# Patient Record
Sex: Female | Born: 1955 | Race: White | Hispanic: No | Marital: Married | Smoking: Never smoker
Health system: Southern US, Community
[De-identification: ages and names within clinical notes are randomized; demographics above are authoritative.]

## PROBLEM LIST (undated history)

## (undated) DIAGNOSIS — IMO0002 Reserved for concepts with insufficient information to code with codable children: Secondary | ICD-10-CM

## (undated) DIAGNOSIS — Z9889 Other specified postprocedural states: Secondary | ICD-10-CM

## (undated) DIAGNOSIS — M1611 Unilateral primary osteoarthritis, right hip: Secondary | ICD-10-CM

## (undated) DIAGNOSIS — Z8679 Personal history of other diseases of the circulatory system: Secondary | ICD-10-CM

## (undated) DIAGNOSIS — D134 Benign neoplasm of liver: Secondary | ICD-10-CM

## (undated) DIAGNOSIS — I495 Sick sinus syndrome: Secondary | ICD-10-CM

## (undated) DIAGNOSIS — E039 Hypothyroidism, unspecified: Secondary | ICD-10-CM

## (undated) DIAGNOSIS — R112 Nausea with vomiting, unspecified: Secondary | ICD-10-CM

## (undated) DIAGNOSIS — C801 Malignant (primary) neoplasm, unspecified: Secondary | ICD-10-CM

## (undated) DIAGNOSIS — M1990 Unspecified osteoarthritis, unspecified site: Secondary | ICD-10-CM

## (undated) DIAGNOSIS — K219 Gastro-esophageal reflux disease without esophagitis: Secondary | ICD-10-CM

## (undated) DIAGNOSIS — I341 Nonrheumatic mitral (valve) prolapse: Secondary | ICD-10-CM

## (undated) DIAGNOSIS — Z86010 Personal history of colonic polyps: Secondary | ICD-10-CM

## (undated) DIAGNOSIS — M5090 Cervical disc disorder, unspecified, unspecified cervical region: Secondary | ICD-10-CM

## (undated) DIAGNOSIS — N816 Rectocele: Secondary | ICD-10-CM

## (undated) DIAGNOSIS — R011 Cardiac murmur, unspecified: Secondary | ICD-10-CM

## (undated) HISTORY — DX: Rectocele: N81.6

## (undated) HISTORY — DX: Cardiac murmur, unspecified: R01.1

## (undated) HISTORY — DX: Reserved for concepts with insufficient information to code with codable children: IMO0002

## (undated) HISTORY — DX: Personal history of other diseases of the circulatory system: Z86.79

## (undated) HISTORY — DX: Sick sinus syndrome: I49.5

## (undated) HISTORY — DX: Personal history of colonic polyps: Z86.010

## (undated) HISTORY — DX: Unilateral primary osteoarthritis, right hip: M16.11

## (undated) HISTORY — DX: Unspecified osteoarthritis, unspecified site: M19.90

## (undated) HISTORY — DX: Nonrheumatic mitral (valve) prolapse: I34.1

## (undated) HISTORY — DX: Benign neoplasm of liver: D13.4

## (undated) HISTORY — DX: Cervical disc disorder, unspecified, unspecified cervical region: M50.90

## (undated) HISTORY — PX: JOINT REPLACEMENT: SHX530

## (undated) HISTORY — PX: SPINE SURGERY: SHX786

## (undated) HISTORY — PX: OTHER SURGICAL HISTORY: SHX169

## (undated) HISTORY — DX: Gastro-esophageal reflux disease without esophagitis: K21.9

---

## 1995-11-29 HISTORY — PX: LIVER BIOPSY: SHX301

## 1996-05-28 HISTORY — PX: ABDOMINAL HYSTERECTOMY: SHX81

## 2006-06-30 ENCOUNTER — Ambulatory Visit (HOSPITAL_COMMUNITY): Admission: RE | Admit: 2006-06-30 | Discharge: 2006-06-30 | Payer: Self-pay | Admitting: Family Medicine

## 2006-07-10 ENCOUNTER — Encounter: Admission: RE | Admit: 2006-07-10 | Discharge: 2006-07-10 | Payer: Self-pay | Admitting: Family Medicine

## 2006-11-28 HISTORY — PX: KNEE RECONSTRUCTION: SHX5883

## 2008-02-07 ENCOUNTER — Other Ambulatory Visit: Admission: RE | Admit: 2008-02-07 | Discharge: 2008-02-07 | Payer: Self-pay | Admitting: Family Medicine

## 2008-03-10 ENCOUNTER — Ambulatory Visit (HOSPITAL_COMMUNITY): Admission: RE | Admit: 2008-03-10 | Discharge: 2008-03-10 | Payer: Self-pay | Admitting: Family Medicine

## 2009-11-24 ENCOUNTER — Ambulatory Visit (HOSPITAL_COMMUNITY): Admission: RE | Admit: 2009-11-24 | Discharge: 2009-11-24 | Payer: Self-pay | Admitting: Family Medicine

## 2010-11-28 HISTORY — PX: BACK SURGERY: SHX140

## 2010-12-20 ENCOUNTER — Encounter: Payer: Self-pay | Admitting: Family Medicine

## 2011-06-14 LAB — CBC WITH DIFFERENTIAL/PLATELET
HGB: 14.4 g/dL
Hematocrit: 42.1 % (ref 34.8–46)
MCHC: 34.3
MCV (KUC): 89.2 fL (ref 78–100)
Platelets: 318
RBC: 4.72
RDW: 12.7
WBC: 9.9

## 2011-06-22 ENCOUNTER — Ambulatory Visit (HOSPITAL_COMMUNITY): Payer: BC Managed Care – PPO

## 2011-06-22 ENCOUNTER — Ambulatory Visit (HOSPITAL_COMMUNITY)
Admission: RE | Admit: 2011-06-22 | Discharge: 2011-06-23 | Disposition: A | Discharge status: Home / Self Care | Payer: BC Managed Care – PPO | Source: Ambulatory Visit | Attending: Orthopedic Surgery | Admitting: Orthopedic Surgery

## 2011-06-22 DIAGNOSIS — E669 Obesity, unspecified: Secondary | ICD-10-CM | POA: Insufficient documentation

## 2011-06-22 DIAGNOSIS — M5126 Other intervertebral disc displacement, lumbar region: Secondary | ICD-10-CM | POA: Insufficient documentation

## 2011-06-22 LAB — CBC
HCT: 37.9 % (ref 36.0–46.0)
MCHC: 33.8 g/dL (ref 30.0–36.0)
RDW: 13.2 % (ref 11.5–15.5)

## 2011-06-22 LAB — SURGICAL PCR SCREEN: Staphylococcus aureus: NEGATIVE

## 2011-06-30 NOTE — Op Note (Signed)
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  OPERATIVE PROCEDURE:  Lumbar laminectomy for diskectomy L4-5.  COMPLICATIONS:  None.  CONDITION:  Stable.  FIRST ASSISTANT:  Norval Gable, PA  REASON FOR SURGERY:  Severe right L5 radicular pain with foot drop and weakness with MRI findings consistent with acute L4-5 disk herniation posterolateral to the right.  OPERATIVE NOTE:  The patient is a pleasant 55 year old woman with severe back and right leg pain.  She was evaluated in the holding area.  She identified the correct extremity, it was marked.  She was brought back to the operating room, placed supine on the operating table.  After successful induction of general anesthesia and endotracheal intubation, TEDs, SCDs were applied.  She was turned prone onto the Bartlett frame. All bony prominences were well padded.  Back was prepped and draped in standard fashion.  Appropriate time-out was done to confirm patient, procedure, affected extremity and all other pertinent important data. Two 18-gauge needles were placed at the back for localization of incision.  X-rays were done which demonstrated satisfactory position.  A 1-inch incision was made on the back in the midline.  Sharp dissection was carried out down to the deep fascia.  Deep fascia was sharply incised and I stripped the paraspinal muscle to expose the L4 lamina.  I placed a Cytogeneticist and a Penfield 4 underneath the L4 lamina.  I then took a second x-ray confirming that I was at the appropriate level. Once I confirmed I was at  the appropriate level, a laminotomy was performed with 3-mm Kerrison at L4.  I then released the ligamentum flavum from the insertion of the L5 leading into the lamina and then removed the ligamentum flavum in its entirety to expose the thecal sac. I dissected with a Penfield 4 to lateral gutter and I noted a very large disk herniation.  The disk had caused significant compression of the nerve root against the L5 lamina.  I mobilized the disk, applied gentle downward pressure on the annulus which allowed the disk material to rub through.  Using a micro pituitary rongeur, I removed multiple large fragments of disk material consistent with what was seen on the MRI.  I then swept circumferentially with a nerve hook removing other free fragments of disk material.  At this point, I then identified the disk itself, used a micro pituitary rongeur to insert into the disk space and removed any other loose fragments of disk material that has not yet herniated out.  At this point, I irrigated the wound copiously with normal saline, used my bipolar electrocautery to obtain hemostasis.  A Woodson elevator was used to pass superiorly and inferiorly in the lateral gutter and out the L5 foramen.  The L5 nerve root was completely decompressed, it was  free of any tension.  I could pass the [facility] elevator circumferential at the disk space.  There was no further fragments of disk material centrally, inferiorly, superiorly or in the lateral recess or in the foramen.  At this point, I then placed thrombin- soaked Gelfoam patty over the exposed lamina and then closed the deep fascia with interrupted #1 Vicryl suture.  I then closed the deep fascia with interrupted 2-0 Vicryl suture and 3-0 Monocryl for the skin.  Steri-Strips and dry dressings were applied.  The patient was extubated, transferred to PACU without incident.  At the end of the case, all needle and sponge counts were correct.     Alvy Beal, MD     DDB/MEDQ  D:  06/22/2011  T:  06/23/2011  Job:  [id number]  Electronically Signed by Venita Lick MD on 06/30/2011 11:29:52 AM

## 2012-12-17 LAB — LIPID PANEL
CHOLESTEROL: 217 mg/dL — AB (ref 0–200)
Cholesterol: 2.9
LDL DIRECT: 113
Non-HDL Cholesterol: 141
TOTAL HDL-C DIRECT: 76
TRIGLYCERIDES: 103

## 2013-01-18 LAB — COMPLETE METABOLIC PANEL WITH GFR
ALP, HEAT STABLE (LIVER): 73
ALT: 15 U/L (ref 7–35)
AST: 15 U/L
Albumin: 4.7
Anion gap: 11
BILIRUBIN TOTAL: 0.5 mg/dL
BUN: 12 mg/dL (ref 4–21)
CHLORIDE: 103 mmol/L
CO2: 29 mmol/L
CREATININE: 0.71
Calcium: 9.8 mg/dL
GFR, EST NON AFRICAN AMERICAN: 85
GFR, Est African American: 103
Glucose: 76
POTASSIUM: 4 mmol/L
SODIUM: 139 mmol/L (ref 137–147)
Total Protein: 7.1 g/dL

## 2014-03-17 ENCOUNTER — Ambulatory Visit (INDEPENDENT_AMBULATORY_CARE_PROVIDER_SITE_OTHER): Payer: BC Managed Care – PPO | Admitting: Nurse Practitioner

## 2014-03-17 ENCOUNTER — Encounter: Payer: Self-pay | Admitting: Nurse Practitioner

## 2014-03-17 VITALS — BP 122/80 | HR 56 | Temp 98.2°F | Resp 18 | Ht 68.0 in | Wt 221.0 lb

## 2014-03-17 DIAGNOSIS — R232 Flushing: Secondary | ICD-10-CM

## 2014-03-17 DIAGNOSIS — Z8679 Personal history of other diseases of the circulatory system: Secondary | ICD-10-CM

## 2014-03-17 DIAGNOSIS — L989 Disorder of the skin and subcutaneous tissue, unspecified: Secondary | ICD-10-CM | POA: Insufficient documentation

## 2014-03-17 MED ORDER — ESCITALOPRAM OXALATE 10 MG PO TABS: 10.0000 mg | ORAL_TABLET | Freq: Every day | ORAL | Status: DC

## 2014-03-17 NOTE — Progress Notes (Signed)
Pre visit review using our clinic review tool, if applicable. No additional management support is needed unless otherwise documented below in the visit note. 

## 2014-03-17 NOTE — Patient Instructions (Signed)
Start lexapro for hot flushes. I can go up on dose if hot flushes are not controlled. Use for at least 2 weeks before deciding if this dose works. Please follow up in 4 weeks.   Menopause Menopause is the normal time of life when menstrual periods stop completely. Menopause is complete when you have missed 12 consecutive menstrual periods. It usually occurs between the ages of 54 years and 76 years. Very rarely does a woman develop menopause before the age of 70 years. At menopause, your ovaries stop producing the female hormones estrogen and progesterone. This can cause undesirable symptoms and also affect your health. Sometimes the symptoms may occur 4 5 years before the menopause begins. There is no relationship between menopause and:  Oral contraceptives.  Number of children you had.  Race.  The age your menstrual periods started (menarche). Heavy smokers and very thin women may develop menopause earlier in life. CAUSES  The ovaries stop producing the female hormones estrogen and progesterone.  Other causes include:  Surgery to remove both ovaries.  The ovaries stop functioning for no known reason.  Tumors of the pituitary gland in the brain.  Medical disease that affects the ovaries and hormone production.  Radiation treatment to the abdomen or pelvis.  Chemotherapy that affects the ovaries. SYMPTOMS   Hot flashes.  Night sweats.  Decrease in sex drive.  Vaginal dryness and thinning of the vagina causing painful intercourse.  Dryness of the skin and developing wrinkles.  Headaches.  Tiredness.  Irritability.  Memory problems.  Weight gain.  Bladder infections.  Hair growth of the face and chest.  Infertility. More serious symptoms include:  Loss of bone (osteoporosis) causing breaks (fractures).  Depression.  Hardening and narrowing of the arteries (atherosclerosis) causing heart attacks and strokes. DIAGNOSIS   When the menstrual periods have  stopped for 12 straight months.  Physical exam.  Hormone studies of the blood. TREATMENT  There are many treatment choices and nearly as many questions about them. The decisions to treat or not to treat menopausal changes is an individual choice made with your health care provider. Your health care provider can discuss the treatments with you. Together, you can decide which treatment will work best for you. Your treatment choices may include:   Hormone therapy (estrogen and progesterone).  Non-hormonal medicines.  Treating the individual symptoms with medicine (for example antidepressants for depression).  Herbal medicines that may help specific symptoms.  Counseling by a psychiatrist or psychologist.  Group therapy.  Lifestyle changes including:  Eating healthy.  Regular exercise.  Limiting caffeine and alcohol.  Stress management and meditation.  No treatment. HOME CARE INSTRUCTIONS   Take the medicine your health care provider gives you as directed.  Get plenty of sleep and rest.  Exercise regularly.  Eat a diet that contains calcium (good for the bones) and soy products (acts like estrogen hormone).  Avoid alcoholic beverages.  Do not smoke.  If you have hot flashes, dress in layers.  Take supplements, calcium, and vitamin D to strengthen bones.  You can use over-the-counter lubricants or moisturizers for vaginal dryness.  Group therapy is sometimes very helpful.  Acupuncture may be helpful in some cases. SEEK MEDICAL CARE IF:   You are not sure you are in menopause.  You are having menopausal symptoms and need advice and treatment.  You are still having menstrual periods after age 53 years.  You have pain with intercourse.  Menopause is complete (no menstrual period for 12  months) and you develop vaginal bleeding.  You need a referral to a specialist (gynecologist, psychiatrist, or psychologist) for treatment. SEEK IMMEDIATE MEDICAL CARE IF:    You have severe depression.  You have excessive vaginal bleeding.  You fell and think you have a broken bone.  You have pain when you urinate.  You develop leg or chest pain.  You have a fast pounding heart beat (palpitations).  You have severe headaches.  You develop vision problems.  You feel a lump in your breast.  You have abdominal pain or severe indigestion. Document Released: 02/04/2004 Document Revised: 07/17/2013 Document Reviewed: 06/13/2013 [facility] Patient Information 2014 [place], [place].

## 2014-03-17 NOTE — Progress Notes (Signed)
Subjective:    Cynthia [name] is a 58 y.o. female who wishes to establish care. She wants to discuss hormone replacement therapy. SHe has been taking .5 mg estradiol for 15 years. SHe had hysterectomy in 1997, started having hot flashes a few years later & started on HRT to manage vasomotor symptoms. She is concerned about harmful effects of HRT and wishes to discontinue HRT. She has been weaning HRT over the last 3 weeks, taking qod to q3d. She has had a few hot flushes and decreased energy, but also c/o respiratory illness in last few weeks, symptoms resolving.  Also she is concerned about a lesion on nose. She states lesion has been there for several years, but it has gotten larger & bleeds occasionally Of note, she gives Hx of cardiac arrythmia-states SA nose conduction problem. Occasionally has several hours of "palpitations" NOT accompanied by nausea, diaphoresis, SOB, chest or neck pain or discomfort. Last evaluated by cardio about 8 yrs ago. ECG at most recent PCP a few years ago. She is not interested in pursuing work up.  No LMP recorded. Patient has had a hysterectomy.   The following portions of the patient's history were reviewed and updated as appropriate: allergies, current medications, past family history, past medical history, past social history, past surgical history and problem list.  Review of Systems Constitutional: positive for fatigue, negative for fevers and night sweats Eyes: positive for contacts/glasses Ears, nose, mouth, throat, and face: negative for nasal congestion and sore throat Respiratory: positive for cough and resolving Cardiovascular: positive for palpitations, negative for chest pain, chest pressure/discomfort, irregular heart beat, lower extremity edema and syncope Gastrointestinal: positive for constipation, diarrhea and rectocele-has to manually push stool out sometimes, negative for abdominal pain, dyspepsia, melena and  nausea Genitourinary:negative Integument/breast: negative Musculoskeletal:positive for arthralgias, back pain and am stiffness in hips Behavioral/Psych: negative for excessive alcohol consumption, illegal drug usage, tobacco use and positive for sleep disturbance    Objective:     BP 122/80  Pulse 56  Temp(Src) 98.2 F (36.8 C) (Oral)  Resp 18  Ht 5\' 8"  (1.727 m)  Wt 221 lb (100.245 kg)  BMI 33.61 kg/m2  SpO2 99% BP 122/80  Pulse 56  Temp(Src) 98.2 F (36.8 C) (Oral)  Resp 18  Ht 5\' 8"  (1.727 m)  Wt 221 lb (100.245 kg)  BMI 33.61 kg/m2  SpO2 99% General appearance: alert, cooperative, appears stated age and no distress Head: Normocephalic, without obvious abnormality, atraumatic Eyes: negative findings: lids and lashes normal, conjunctivae and sclerae normal and wearing glasses Lungs: clear to auscultation bilaterally Heart: regular rate and rhythm, S1, S2 normal, no murmur, click, rub or gallop   Skin: pink lesionR side of nose. Approximately 30mm, raised, scooped center Assessment & Plan:    1. Vasomotor flushing Stop estradiol - escitalopram (LEXAPRO) 10 MG tablet; Take 1 tablet (10 mg total) by mouth daily.  Dispense: 30 tablet; Refill: 1 F/u 3-4 weeks. 2. Skin lesion of face Suspicious of basal cell Ca - Ambulatory referral to Dermatology

## 2014-04-14 ENCOUNTER — Ambulatory Visit: Payer: BC Managed Care – PPO | Admitting: Nurse Practitioner

## 2014-04-17 ENCOUNTER — Other Ambulatory Visit: Payer: Self-pay | Admitting: Nurse Practitioner

## 2014-04-17 DIAGNOSIS — R232 Flushing: Secondary | ICD-10-CM

## 2014-04-17 MED ORDER — ESCITALOPRAM OXALATE 10 MG PO TABS: 10.0000 mg | ORAL_TABLET | Freq: Every day | ORAL | Status: DC

## 2014-04-17 NOTE — Progress Notes (Signed)
Left detailed message on pt's vm requesting that she return call to reschedule ov appointment for mid-august.

## 2014-04-25 ENCOUNTER — Ambulatory Visit (INDEPENDENT_AMBULATORY_CARE_PROVIDER_SITE_OTHER): Payer: BC Managed Care – PPO | Admitting: Nurse Practitioner

## 2014-04-25 ENCOUNTER — Encounter: Payer: Self-pay | Admitting: Nurse Practitioner

## 2014-04-25 VITALS — BP 121/81 | HR 72 | Temp 98.1°F | Ht 68.0 in | Wt 220.0 lb

## 2014-04-25 DIAGNOSIS — L989 Disorder of the skin and subcutaneous tissue, unspecified: Secondary | ICD-10-CM

## 2014-04-25 DIAGNOSIS — B9789 Other viral agents as the cause of diseases classified elsewhere: Secondary | ICD-10-CM

## 2014-04-25 DIAGNOSIS — J988 Other specified respiratory disorders: Principal | ICD-10-CM

## 2014-04-25 DIAGNOSIS — R232 Flushing: Secondary | ICD-10-CM

## 2014-04-25 NOTE — Progress Notes (Signed)
Pre visit review using our clinic review tool, if applicable. No additional management support is needed unless otherwise documented below in the visit note. 

## 2014-04-25 NOTE — Patient Instructions (Signed)
You should be better in a week.   Voice rest this weekend.   You may have injured the cricoid cartilege with vigorous coughing. It will heal, but will take several weeks.  Continue benzocaine throat lozenges.  I will call when I receive your historical records and create a plan for preventive care.

## 2014-04-25 NOTE — Progress Notes (Signed)
Patient scheduled appt for 04/25/2014 at 4:00 pm.

## 2014-04-28 ENCOUNTER — Other Ambulatory Visit: Payer: Self-pay | Admitting: Nurse Practitioner

## 2014-04-28 DIAGNOSIS — B9789 Other viral agents as the cause of diseases classified elsewhere: Secondary | ICD-10-CM | POA: Insufficient documentation

## 2014-04-28 DIAGNOSIS — J988 Other specified respiratory disorders: Principal | ICD-10-CM

## 2014-04-28 NOTE — Assessment & Plan Note (Signed)
Spoke w/ pt on 5/23-saw at school event. She c/o sore throat, fatigue, nasal congestion, cough. Denied fever, HA, abd pain, N&V&D. Had symtpoms for 1 week. I prescribed tussionex, benzonatate capsules, adv to rinse sinuses, use throat lozenge. ShE is better, coughing less, sleeping, more energy, although not 100% to baseline. Nml exam.

## 2014-04-28 NOTE — Progress Notes (Signed)
Subjective:     Cynthia [name] is a 58 y.o. female who presents for follow up of vasomotor symptoms, & cough. She was started on low dose lexapro to control menopausal vasomotor symptoms-hot flushes. She had been on HRT for 10 years +. She started lexapro, weaned off estrogen. Feels well, no vasomotor symptoms, sleeping better. Regarding cough, she has improved w/tussionex & benzonatate. She denies fever, chest pain, but has pain in throat-as if strained throat from coughing.  She went to derm, had nose lesion removed, Pt states derm feels lesion is BCC. She has f/u appt. Pending. We discussed need for CPE. Still waiting in historical records.  The following portions of the patient's history were reviewed and updated as appropriate: allergies, current medications, past family history, past medical history, past social history, past surgical history and problem list.  Review of Systems Pertinent items are noted in HPI.    Objective:    BP 121/81  Pulse 72  Temp(Src) 98.1 F (36.7 C) (Temporal)  Ht 5\' 8"  (1.727 m)  Wt 220 lb (99.791 kg)  BMI 33.46 kg/m2  SpO2 99% BP 121/81  Pulse 72  Temp(Src) 98.1 F (36.7 C) (Temporal)  Ht 5\' 8"  (1.727 m)  Wt 220 lb (99.791 kg)  BMI 33.46 kg/m2  SpO2 99% General appearance: alert, cooperative, appears stated age and no distress Head: Normocephalic, without obvious abnormality, atraumatic Eyes: negative findings: lids and lashes normal and conjunctivae and sclerae normal Ears: normal TM's and external ear canals both ears Nose: scant white discharge bilat nares Throat: lips, mucosa, and tongue normal; teeth and gums normal Lungs: clear to auscultation bilaterally Heart: regular rate and rhythm, S1, S2 normal, no murmur, click, rub or gallop    Assessment & plan:     1. Viral respiratory illness   2. Skin lesion of face   3. Vasomotor flushing    See problem list for complete A&P See pt instructions. Pt to schedule CPE at  convenience-will call when receive historical records.

## 2014-04-28 NOTE — Assessment & Plan Note (Signed)
Lesion removed. Derm thinks BCC. Path results not returned yet. Will continue to f/u w/derm.

## 2014-04-28 NOTE — Assessment & Plan Note (Signed)
Complete resolve in symptoms. Stopped estrogen replacement. Continue lexapro 10 mg qhs. Sleeping better.

## 2014-05-21 ENCOUNTER — Ambulatory Visit (INDEPENDENT_AMBULATORY_CARE_PROVIDER_SITE_OTHER): Payer: BC Managed Care – PPO | Admitting: Family Medicine

## 2014-05-21 ENCOUNTER — Encounter: Payer: Self-pay | Admitting: Family Medicine

## 2014-05-21 VITALS — BP 109/73 | HR 68 | Temp 98.7°F | Resp 18 | Ht 68.0 in | Wt 220.0 lb

## 2014-05-21 DIAGNOSIS — R058 Other specified cough: Secondary | ICD-10-CM

## 2014-05-21 DIAGNOSIS — R05 Cough: Secondary | ICD-10-CM

## 2014-05-21 DIAGNOSIS — R059 Cough, unspecified: Secondary | ICD-10-CM

## 2014-05-21 NOTE — Patient Instructions (Signed)
Buy generic otc nonsedating antihistamine (zyrtec or allegra) and take every day at full adult dosing for 2 weeks. Buy OTC generic prilosec and take once daily for 2 weeks (best taken in morning).

## 2014-05-21 NOTE — Progress Notes (Signed)
OFFICE NOTE  05/21/2014  CC:  Chief Complaint  Patient presents with  . Ear Drainage  . Sinus Problem  . Cough    mainly at night   HPI: Patient is a 58 y.o. Caucasian female who is here for 3 mo hx of : started with severe sore throat, low grade fever, then this morphed into common cold sx's.  She waited a while and then presented here and was treated symptomatically for viral URI w/paroxysmal coughing. Sx's are better, but she still has feeling of ears being plugged up, has some PND at night, has a coughing paroxysm about once per night.  No otc meds.  Takes tessalon perles at night. Cough is minimal at night.  No drainage out her nose.  No wheezing or SOB or CP.  Pertinent PMH:  Past Medical History  Diagnosis Date  . Arthritis   . 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 surveillance  . Rectocele   . SA node dysfunction   . History of rheumatic fever     childhood  . Mitral valve prolapse    Past Surgical History  Procedure Laterality Date  . Knee reconstruction Right 2008  . Back surgery  2012    disc rupture  . Liver biopsy  1997    fatty tumors  . Abdominal hysterectomy      MEDS:  Outpatient Prescriptions Prior to Visit  Medication Sig Dispense Refill  . benzonatate (TESSALON) 100 MG capsule       . escitalopram (LEXAPRO) 10 MG tablet Take 1 tablet (10 mg total) by mouth daily.  90 tablet  0  . chlorpheniramine-HYDROcodone (TUSSIONEX) 10-8 MG/5ML LQCR        No facility-administered medications prior to visit.    PE: Blood pressure 109/73, pulse 68, temperature 98.7 F (37.1 C), temperature source Temporal, resp. rate 18, height 5\' 8"  (1.727 m), weight 220 lb (99.791 kg), SpO2 97.00%. Gen: Alert, well appearing.  Patient is oriented to person, place, time, and situation. ENT: Ears: EACs clear, normal epithelium.  TMs with good light reflex and landmarks bilaterally.  Eyes: no injection, icteris, swelling, or exudate.  EOMI,  PERRLA. Nose: no drainage or turbinate edema/swelling.  No injection or focal lesion.  Mouth: lips without lesion/swelling.  Oral mucosa pink and moist.  Dentition intact and without obvious caries or gingival swelling.  Oropharynx without erythema, exudate, or swelling.  Neck - No masses or thyromegaly or limitation in range of motion CV: RRR, no m/r/g.   LUNGS: CTA bilat, nonlabored resps, good aeration in all lung fields.   IMPRESSION AND PLAN:  Upper airway cough syndrome: discussed dx.  REassured pt. May continue prn tessalon perles. Add nightly OTC nonsedating antihistamine and prilosec 20mg  OTC qAM.  FOLLOW UP: in 2-3 wks if not significantly improved.    2

## 2014-05-21 NOTE — Progress Notes (Signed)
Pre visit review using our clinic review tool, if applicable. No additional management support is needed unless otherwise documented below in the visit note. 

## 2014-05-22 ENCOUNTER — Ambulatory Visit: Payer: BC Managed Care – PPO | Admitting: Family Medicine

## 2014-05-27 ENCOUNTER — Encounter: Payer: Self-pay | Admitting: Nurse Practitioner

## 2014-05-27 DIAGNOSIS — Z86010 Personal history of colonic polyps: Secondary | ICD-10-CM | POA: Insufficient documentation

## 2014-05-27 DIAGNOSIS — Z0000 Encounter for general adult medical examination without abnormal findings: Secondary | ICD-10-CM | POA: Insufficient documentation

## 2014-06-06 ENCOUNTER — Encounter: Payer: Self-pay | Admitting: *Deleted

## 2014-07-16 ENCOUNTER — Other Ambulatory Visit: Payer: Self-pay | Admitting: *Deleted

## 2014-07-16 DIAGNOSIS — R232 Flushing: Secondary | ICD-10-CM

## 2014-07-16 NOTE — Telephone Encounter (Signed)
Refill request for escitalopram Last filled by MD on- 04/17/14 #90 x0 Last Appt: 05/21/2014 Next Appt: none Please advise refill?

## 2014-07-17 MED ORDER — ESCITALOPRAM OXALATE 10 MG PO TABS: 10.0000 mg | ORAL_TABLET | Freq: Every day | ORAL | Status: DC

## 2015-01-12 ENCOUNTER — Other Ambulatory Visit: Payer: Self-pay | Admitting: Nurse Practitioner

## 2015-01-12 ENCOUNTER — Telehealth: Payer: Self-pay | Admitting: Nurse Practitioner

## 2015-01-12 DIAGNOSIS — R232 Flushing: Secondary | ICD-10-CM

## 2015-01-12 MED ORDER — ESCITALOPRAM OXALATE 10 MG PO TABS: 10.0000 mg | ORAL_TABLET | Freq: Every day | ORAL | Status: DC

## 2015-01-12 NOTE — Telephone Encounter (Signed)
Escitalopram 10mg  Tablet last Filled: 11.15.15

## 2015-01-13 NOTE — Progress Notes (Signed)
Left detailed message on pt's vm. Per dpr.

## 2015-01-19 NOTE — Progress Notes (Addendum)
Left 3rd message for pt to return call and schedule appt.

## 2015-01-19 NOTE — Progress Notes (Signed)
Left 2nd message for pt to return call to schedule appt.

## 2015-02-04 NOTE — Progress Notes (Signed)
Unable to contact letter sent to patient; 

## 2015-04-06 ENCOUNTER — Other Ambulatory Visit: Payer: Self-pay | Admitting: Nurse Practitioner

## 2015-04-06 DIAGNOSIS — R232 Flushing: Secondary | ICD-10-CM

## 2015-04-06 MED ORDER — ESCITALOPRAM OXALATE 10 MG PO TABS: 10.0000 mg | ORAL_TABLET | Freq: Every day | ORAL | Status: DC

## 2015-04-10 ENCOUNTER — Telehealth: Payer: Self-pay

## 2015-04-10 ENCOUNTER — Ambulatory Visit (INDEPENDENT_AMBULATORY_CARE_PROVIDER_SITE_OTHER): Payer: BC Managed Care – PPO | Admitting: Nurse Practitioner

## 2015-04-10 ENCOUNTER — Encounter: Payer: Self-pay | Admitting: Nurse Practitioner

## 2015-04-10 ENCOUNTER — Other Ambulatory Visit: Payer: Self-pay | Admitting: Nurse Practitioner

## 2015-04-10 VITALS — BP 110/74 | HR 65 | Temp 98.2°F | Ht 68.0 in | Wt 228.0 lb

## 2015-04-10 DIAGNOSIS — M25569 Pain in unspecified knee: Secondary | ICD-10-CM | POA: Insufficient documentation

## 2015-04-10 DIAGNOSIS — J069 Acute upper respiratory infection, unspecified: Secondary | ICD-10-CM | POA: Diagnosis not present

## 2015-04-10 DIAGNOSIS — Z0000 Encounter for general adult medical examination without abnormal findings: Secondary | ICD-10-CM | POA: Diagnosis not present

## 2015-04-10 DIAGNOSIS — J029 Acute pharyngitis, unspecified: Secondary | ICD-10-CM | POA: Diagnosis not present

## 2015-04-10 DIAGNOSIS — Z114 Encounter for screening for human immunodeficiency virus [HIV]: Secondary | ICD-10-CM

## 2015-04-10 DIAGNOSIS — M25561 Pain in right knee: Secondary | ICD-10-CM | POA: Diagnosis not present

## 2015-04-10 LAB — COMPREHENSIVE METABOLIC PANEL
ALK PHOS: 114 U/L (ref 39–117)
ALT: 18 U/L (ref 0–35)
AST: 17 U/L (ref 0–37)
Albumin: 4.4 g/dL (ref 3.5–5.2)
BILIRUBIN TOTAL: 0.5 mg/dL (ref 0.2–1.2)
BUN: 15 mg/dL (ref 6–23)
CHLORIDE: 102 meq/L (ref 96–112)
CO2: 25 meq/L (ref 19–32)
Calcium: 9.9 mg/dL (ref 8.4–10.5)
Creatinine, Ser: 0.68 mg/dL (ref 0.40–1.20)
GFR: 94.3 mL/min (ref >60.00)
Glucose, Bld: 113 mg/dL — ABNORMAL HIGH (ref 70–99)
Potassium: 4.2 mEq/L (ref 3.5–5.1)
Sodium: 137 mEq/L (ref 135–145)
Total Protein: 7 g/dL (ref 6.0–8.3)

## 2015-04-10 LAB — VITAMIN D 25 HYDROXY (VIT D DEFICIENCY, FRACTURES): VITD: 12.96 ng/mL — AB (ref 30.00–100.00)

## 2015-04-10 LAB — TSH: TSH: 2.64 u[IU]/mL (ref 0.35–4.50)

## 2015-04-10 LAB — CBC WITH DIFFERENTIAL/PLATELET
Basophils Absolute: 0 10*3/uL (ref 0.0–0.1)
Basophils Relative: 0.4 % (ref 0.0–3.0)
Eosinophils Absolute: 0.2 10*3/uL (ref 0.0–0.7)
Eosinophils Relative: 2.8 % (ref 0.0–5.0)
HEMATOCRIT: 39.7 % (ref 36.0–46.0)
HEMOGLOBIN: 13.5 g/dL (ref 12.0–15.0)
LYMPHS ABS: 1.3 10*3/uL (ref 0.7–4.0)
Lymphocytes Relative: 17.9 % (ref 12.0–46.0)
MCHC: 33.9 g/dL (ref 30.0–36.0)
MCV: 89.2 fl (ref 78.0–100.0)
MONO ABS: 0.4 10*3/uL (ref 0.1–1.0)
Monocytes Relative: 6 % (ref 3.0–12.0)
Neutro Abs: 5.4 10*3/uL (ref 1.4–7.7)
Neutrophils Relative %: 72.9 % (ref 43.0–77.0)
Platelets: 265 10*3/uL (ref 150.0–400.0)
RBC: 4.45 Mil/uL (ref 3.87–5.11)
RDW: 14.3 % (ref 11.5–15.5)
WBC: 7.4 10*3/uL (ref 4.0–10.5)

## 2015-04-10 LAB — LIPID PANEL
CHOL/HDL RATIO: 3
CHOLESTEROL: 205 mg/dL — AB (ref 0–200)
HDL: 73.8 mg/dL (ref >39.00)
LDL CALC: 102 mg/dL — AB (ref 0–99)
NONHDL: 131.2
Triglycerides: 145 mg/dL (ref 0.0–149.0)
VLDL: 29 mg/dL (ref 0.0–40.0)

## 2015-04-10 LAB — HEMOGLOBIN A1C: Hgb A1c MFr Bld: 5.8 % (ref 4.6–6.5)

## 2015-04-10 LAB — POCT RAPID STREP A (OFFICE): RAPID STREP A SCREEN: NEGATIVE

## 2015-04-10 LAB — T4, FREE: Free T4: 0.5 ng/dL — ABNORMAL LOW (ref 0.60–1.60)

## 2015-04-10 MED ORDER — BENZONATATE 200 MG PO CAPS: 200.0000 mg | ORAL_CAPSULE | Freq: Three times a day (TID) | ORAL | Status: DC | PRN

## 2015-04-10 MED ORDER — PREDNISONE 10 MG PO TABS: ORAL_TABLET | ORAL | Status: DC

## 2015-04-10 MED ORDER — AZITHROMYCIN 250 MG PO TABS: ORAL_TABLET | ORAL | Status: DC

## 2015-04-10 NOTE — Progress Notes (Signed)
Subjective:     Cynthia [name] is a 59 y.o. female presents w/URI symptoms, R Knee pain, Back pain, wants to discuss wt management. URI: onset 4 days. Started w/sore throat & fever. Now has nasal congestion, cough, substernal cp when coughs, ear pain. Sore throat getting better, fever resolved. Not taking anything for symptoms. 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 R Knee pain: Onset-about 1 week. Hx 3 surgeries. Last surgery 3-4 yrs ago. Knee feels swollen, Thinks she is favoring R leg when ambulating due to back pain.  Back pain: Recurrent. She saw orthopedist-had steroid inj 6 wks ago w/relief. Is going to PT-core exercises are difficult. She is taking muscle rleaxer w/some relief. Frequent coughing is exacerbating back pain. Has f/u appt w/ortho in 2 wks.   Wt mngmt: lost 2 lbs in last 6 weeks. Exercise is difficult due to back & knee pain. Made some diet changes. Gave her HO w/suggested changes. She asks about med for rapid wt loss, because she may have back surgery. Discussed phentermine & potential SE & indicated use. She will start low dose in 2 weeks, will titrate up if needed. Prev care: labs today, will return to discuss & have physical.  The following portions of the patient's history were reviewed and updated as appropriate: allergies, current medications, past medical history, past social history, past surgical history and problem list.  Review of Systems Constitutional: positive for fatigue, negative for chills Eyes: negative for irritation and redness Ears, nose, mouth, throat, and face: positive for hoarseness, nasal congestion and sore throat Respiratory: positive for sputum and green Cardiovascular: positive for occasional palpitation-lasts few seconds, no associated symptoms, negative for chest pain and lower extremity edema Gastrointestinal: negative for diarrhea Genitourinary:positive for rectocele, "weak pelvic  muscles" Musculoskeletal:positive for swollen knee Neurological: negative for weakness    Objective:    BP 110/74 mmHg  Pulse 65  Temp(Src) 98.2 F (36.8 C) (Oral)  Ht _0  (1.727 m)  Wt 228 lb (103.42 kg)  BMI 34.68 kg/m2  SpO2 97% BP 110/74 mmHg  Pulse 65  Temp(Src) 98.2 F (36.8 C) (Oral)  Ht _1  (1.727 m)  Wt 228 lb (103.42 kg)  BMI 34.68 kg/m2  SpO2 97% General appearance: alert, cooperative, appears stated age and no distress Head: Normocephalic, without obvious abnormality, atraumatic Eyes: negative findings: lids and lashes normal, conjunctivae and sclerae normal and wearing glasses Ears: normal TM's and external ear canals both ears Nose: clear discharge Throat: abnormal findings: posterior pharynx slightly red Neck: no adenopathy, no carotid bruit, supple, symmetrical, trachea midline and thyroid not enlarged, symmetric, no tenderness/mass/nodules Lungs: clear to auscultation bilaterally Heart: regular rate and rhythm, S1, S2 normal, no murmur, click, rub or gallop Neurologic: Grossly normal    Assessment:Plan  1. Sore throat Likely viral - POCT rapid strep A-NEG - Upper Respiratory Culture - CBC with Differential/Platelet  2. Preventative health care Ret for CPE - CBC with Differential/Platelet - Comprehensive metabolic panel - Lipid panel - HIV antibody - Hemoglobin A1c - TSH - T4, free - Vit D  25 hydroxy (rtn osteoporosis monitoring)  3. Upper respiratory infection with cough and congestion Likely viral - predniSONE (DELTASONE) 10 MG tablet; Take 4Tpo qam X 2d, then 3T po qam X 3d, then 2T po qd X 3d, then 1T po qam X 3d.  Dispense: 26 tablet; Refill: 0 - benzonatate (TESSALON) 200 MG capsule; Take 1 capsule (200 mg total) by mouth 3 (three)  times daily as needed for cough.  Dispense: 60 capsule; Refill: 0 - azithromycin (ZITHROMAX) 250 MG tablet; 2T PO today, then 1 T PO days 2-5  Dispense: 6 tablet; Refill: 0  4. Reocurring knee pain,  right F/u w/ortho - predniSONE (DELTASONE) 10 MG tablet; Take 4Tpo qam X 2d, then 3T po qam X 3d, then 2T po qd X 3d, then 1T po qam X 3d.  Dispense: 26 tablet; Refill: 0  F/u at convenience to discuss labs, CPE, wt management.

## 2015-04-10 NOTE — Progress Notes (Signed)
Pre visit review using our clinic review tool, if applicable. No additional management support is needed unless otherwise documented below in the visit note. 

## 2015-04-10 NOTE — Patient Instructions (Signed)
Start antibiotic. Eat yogurt daily to help prevent antibiotic -associated diarrhea Start prednisone, take in mornings as it can keep you awake.  Take benzonatate capsules for cough as directed. Use sinus rinses for sinus congestion: Neilmed sinus rinse.  Return for physical, discuss labs, weight management.  Let me know if not improving or getting worse.  Nice to see you!

## 2015-04-10 NOTE — Telephone Encounter (Signed)
Patient called and stated that she had a VM to call you back and speak with you?

## 2015-04-11 LAB — HIV ANTIBODY (ROUTINE TESTING W REFLEX): HIV: NONREACTIVE

## 2015-04-13 LAB — CULTURE, UPPER RESPIRATORY: ORGANISM ID, BACTERIA: NORMAL

## 2015-04-15 ENCOUNTER — Telehealth: Payer: Self-pay | Admitting: Nurse Practitioner

## 2015-04-15 ENCOUNTER — Telehealth: Payer: Self-pay

## 2015-04-15 DIAGNOSIS — E559 Vitamin D deficiency, unspecified: Secondary | ICD-10-CM

## 2015-04-15 MED ORDER — VITAMIN D3 1.25 MG (50000 UT) PO CAPS: 1.0000 | ORAL_CAPSULE | ORAL | Status: DC

## 2015-04-15 NOTE — Telephone Encounter (Signed)
Labs reveal prediabetes, vit d def, hdl is protective. Will start prescription strength vit d. TSH nml, free T4 low. Will need to check again in few weeks. LM to CB.

## 2015-04-15 NOTE — Telephone Encounter (Signed)
Patient called back, stating she missed a call from you. Please call back.

## 2015-04-16 ENCOUNTER — Telehealth: Payer: Self-pay | Admitting: Nurse Practitioner

## 2015-04-16 NOTE — Telephone Encounter (Signed)
Discussed lab results. T4 low, but TSH nml: Parathyroid disease?, smoldering hypothyroidism? Will repeat thyroid studies & PTH in 4 weeks Start prescription D3 Prediabetes: cut back sugar.

## 2015-05-12 ENCOUNTER — Encounter: Payer: Self-pay | Admitting: Nurse Practitioner

## 2015-05-12 ENCOUNTER — Ambulatory Visit (INDEPENDENT_AMBULATORY_CARE_PROVIDER_SITE_OTHER): Payer: BC Managed Care – PPO | Admitting: Nurse Practitioner

## 2015-05-12 VITALS — BP 103/68 | HR 51 | Temp 97.8°F | Resp 16 | Ht 68.0 in | Wt 226.0 lb

## 2015-05-12 DIAGNOSIS — M2550 Pain in unspecified joint: Secondary | ICD-10-CM | POA: Insufficient documentation

## 2015-05-12 DIAGNOSIS — R7309 Other abnormal glucose: Secondary | ICD-10-CM

## 2015-05-12 DIAGNOSIS — R946 Abnormal results of thyroid function studies: Secondary | ICD-10-CM

## 2015-05-12 DIAGNOSIS — R7303 Prediabetes: Secondary | ICD-10-CM

## 2015-05-12 DIAGNOSIS — Z0000 Encounter for general adult medical examination without abnormal findings: Secondary | ICD-10-CM

## 2015-05-12 DIAGNOSIS — E559 Vitamin D deficiency, unspecified: Secondary | ICD-10-CM | POA: Insufficient documentation

## 2015-05-12 DIAGNOSIS — Z01810 Encounter for preprocedural cardiovascular examination: Secondary | ICD-10-CM | POA: Insufficient documentation

## 2015-05-12 DIAGNOSIS — Z1239 Encounter for other screening for malignant neoplasm of breast: Secondary | ICD-10-CM

## 2015-05-12 DIAGNOSIS — R7989 Other specified abnormal findings of blood chemistry: Secondary | ICD-10-CM

## 2015-05-12 LAB — MICROALBUMIN / CREATININE URINE RATIO
Creatinine,U: 135.9 mg/dL
Microalb Creat Ratio: 1.4 mg/g (ref 0.0–30.0)
Microalb, Ur: 1.9 mg/dL (ref 0.0–1.9)

## 2015-05-12 LAB — T4, FREE: Free T4: 0.46 ng/dL — ABNORMAL LOW (ref 0.60–1.60)

## 2015-05-12 LAB — RHEUMATOID FACTOR: Rhuematoid fact SerPl-aCnc: <10 IU/mL (ref <=14)

## 2015-05-12 LAB — TSH: TSH: 3.47 u[IU]/mL (ref 0.35–4.50)

## 2015-05-12 LAB — SEDIMENTATION RATE: SED RATE: 15 mm/h (ref 0–22)

## 2015-05-12 NOTE — Patient Instructions (Addendum)
Please schedule colonoscopy.  Please complete mammogram.  Get eye exam.  My office will call with lab results.  Continue to walk 30 minutes daily. Eat at least 5 handfuls of fruits & vegetables daily. Eat 1 handful nuts/seeds daily. Eat whole grains daily-about 1 cup.  Limit or eliminate cheeses, creamed foods, & refined sugars.  Continue daily vitamin D. I will check level again in August.  Start 1000-2000 mg curcumarin daily for joint pain.   My office will call with lab results.

## 2015-05-12 NOTE — Assessment & Plan Note (Addendum)
mammo ordered. Pt will sched colo w/Dr Glennon Hamilton. Pt will sched eye exam. 30 mins exercise daily Nutrition counseling- Eat at least 5 handfuls of fruits & vegetables daily, 1 handful nuts/seeds daily, whole grains daily-about 1 cup; Limit or eliminate cheeses, creamed foods, & refined sugars.

## 2015-05-12 NOTE — Progress Notes (Signed)
Subjective:     Cynthia [name] is a 59 y.o. female and is here for a comprehensive physical exam. The patient reports problems: polyarthralgia, had low T4, prediabetes, & vit D def at pre-visit labs.   preventive: due for MMG-had abnmls in past that were followed w/ no concern for malig, per pt. She has no concerns about lumps. Due for colonoscopy-had polpys removed 6 yrs ago Dr Glennon Hamilton in [place]. Due for eye exam-last was 2 yrs ago & had slight prescription change. Made healthy changes to diet-more plant foods. Increased exercise.  Polyarthralgia: knees, back, hips, hands. Onset-"years". Intermittent. Occasional swelling of R knee (3 previous surgeries-1 was re-construction). 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 Concern: she reports "felt great" when she was on oral prednisone few weeks ago-she could walk 3 miles without pain in hips & knee. "I hated to go off of it". Will screen for serum markers for inflammatory arthritis. low T4: nml TSH. Frustration with weight loss. Lost 8 lbs when able to walk, but as pain has come back since stopping prednisone, activity has decreased & gained 6 lbs. Back. Constipation for years. 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 Will repeat thyroid panel & add TPO & PTH. Prediabetes: A1C 5.8. Discussed increased risk for developing DM. Discussed importance of regular exercise & cutting back refined sugars to decrease insulin resistance. Will check A1C in 8 weeks vit D def: started prescription D3. No ntol SE. Discussed function of D. Will continue for 8 more weeks & will check level.   History   Social History  . Marital Status: Married    Spouse Name: [name]  . Number of Children: 3  . Years of Education: N/A   Occupational History  . [employer]   Social History Main Topics  . Smoking status: Never Smoker   . Smokeless tobacco: Never Used  . Alcohol Use: 2.4 oz/week    4 Glasses of wine per  week  . Drug Use: No  . Sexual Activity: No   Other Topics Concern  . Not on file   Social History Narrative   Ms. Dysart lives in [place] with her husband.  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 She is from [place].   Health Maintenance  Topic Date Due  . MAMMOGRAM  10/11/2015 (Originally 11/25/2011)  . INFLUENZA VACCINE  06/29/2015  . TETANUS/TDAP  02/06/2018  . COLONOSCOPY  07/03/2019  . HIV Screening  Completed    The following portions of the patient's history were reviewed and updated as appropriate: allergies, current medications, past family history, past medical history, past social history, past surgical history and problem list.  Review of Systems Constitutional: negative for fatigue and fevers Ears, nose, mouth, throat, and face: negative for nasal congestion Respiratory: positive for occasional sputum since had URI last month, negative for pleurisy/chest pain Cardiovascular: positive for occasional "flutter", negative for chest pressure/discomfort and lower extremity edema Gastrointestinal: positive for constipation and rectocele necessitates manual assistance for passage of stool. stoolis often "hard balls", unless she increases water intake & eats apple daily, negative for dyspepsia Genitourinary:positive for cystocele & occasional mild hot flash-lexapro is helping, negative for vaginal discharge and . Integument/breast: negative for rash and oral sores Musculoskeletal:negative for myalgias Neurological: negative for headaches Behavioral/Psych: negative for sleep disturbance and tobacco use Endocrine: negative for diabetic symptoms including blurry vision, polydipsia, polyphagia and polyuria  and temperature intolerance   Objective:    BP 103/68 mmHg  Pulse 51  Temp(Src) 97.8 F (36.6 C) (Temporal)  Resp 16  Ht 5\' 8"  (1.727 m)  Wt 226 lb (102.513 kg)  BMI 34.37 kg/m2  SpO2  99% General appearance: alert, cooperative, appears stated age and no distress Eyes: negative findings: lids and lashes normal, conjunctivae and sclerae normal, corneas clear, pupils equal, round, reactive to light and accomodation and visual fields full to confrontation Ears: normal TM's and external ear canals both ears Throat: lips, mucosa, and tongue normal; teeth and gums normal Neck: no carotid bruit, supple, symmetrical, trachea midline and thyroid not enlarged, symmetric, no tenderness/mass/nodules Lungs: clear to auscultation bilaterally Heart: regular rate and rhythm, S1, S2 normal, no murmur, click, rub or gallop Abdomen: soft, non-tender; bowel sounds normal; no masses,  no organomegaly Extremities: no edema, redness or tenderness in the calves or thighs, varicose veins noted and scar anterior R knee, slight warmth Lat R knee, mild swelling. POs squeeze test R MTP. no deformity of toes or fingers. Pulses: 2+ and symmetric Skin: Skin color, texture, turgor normal. 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 Neurologic: Grossly normal    Assessment:Plan  1. Breast cancer screening - MM DIGITAL SCREENING BILATERAL; Future - PTH, intact and calcium  2. Polyarthralgia, new DD: OA, inflammatory arthritis - Antinuclear Antibodies, IFA - Cyclic citrul peptide antibody, IgG - Sedimentation rate - Rheumatoid factor  3. Low T4, nml TSH, new DD: central thyroid disease - TSH - T4, free - Thyroid peroxidase antibody -PTH  4. Preventative health care - Microalbumin / creatinine urine ratio Get MMG, colo, eye exam Lifestyle counseling  5. Vitamin D deficiency, new Continue prescription D3 Check level in 8 weeks  6. Prediabetes, new Daily exercise, limit refined sugars & grains  F/u 8 weeks-lab A1C, D

## 2015-05-12 NOTE — Progress Notes (Signed)
Pre visit review using our clinic review tool, if applicable. No additional management support is needed unless otherwise documented below in the visit note. 

## 2015-05-13 LAB — THYROID PEROXIDASE ANTIBODY: Thyroperoxidase Ab SerPl-aCnc: 2 IU/mL (ref <9)

## 2015-05-13 LAB — PTH, INTACT AND CALCIUM
Calcium: 9.4 mg/dL (ref 8.4–10.5)
PTH: 30 pg/mL (ref 14–64)

## 2015-05-13 LAB — CYCLIC CITRUL PEPTIDE ANTIBODY, IGG

## 2015-05-14 ENCOUNTER — Telehealth: Payer: Self-pay | Admitting: Nurse Practitioner

## 2015-05-14 DIAGNOSIS — E039 Hypothyroidism, unspecified: Secondary | ICD-10-CM

## 2015-05-14 LAB — ANTINUCLEAR ANTIBODIES, IFA: ANA TITER 1: NEGATIVE

## 2015-05-14 MED ORDER — LEVOTHYROXINE SODIUM 25 MCG PO TABS: 25.0000 ug | ORAL_TABLET | Freq: Every day | ORAL | Status: DC

## 2015-05-14 NOTE — Telephone Encounter (Signed)
Pt understands she needs to ret in 6 weeks to have thyroid checked. Discussed potential SE. She understands to CB if experiencing SE.

## 2015-05-14 NOTE — Telephone Encounter (Signed)
Hypothyroidism: low T4, TSH over 3.0, PTH nml, no TPO ab Rheum w/u neg: joint pain is likely OA LM on cell to discuss

## 2015-05-19 ENCOUNTER — Ambulatory Visit (HOSPITAL_BASED_OUTPATIENT_CLINIC_OR_DEPARTMENT_OTHER)
Admission: RE | Admit: 2015-05-19 | Discharge: 2015-05-19 | Disposition: A | Discharge status: Home / Self Care | Payer: BC Managed Care – PPO | Source: Ambulatory Visit | Attending: Nurse Practitioner | Admitting: Nurse Practitioner

## 2015-05-19 DIAGNOSIS — Z1239 Encounter for other screening for malignant neoplasm of breast: Secondary | ICD-10-CM

## 2015-05-19 DIAGNOSIS — Z1231 Encounter for screening mammogram for malignant neoplasm of breast: Secondary | ICD-10-CM | POA: Insufficient documentation

## 2015-06-22 ENCOUNTER — Other Ambulatory Visit: Payer: Self-pay | Admitting: Nurse Practitioner

## 2015-06-22 DIAGNOSIS — R7989 Other specified abnormal findings of blood chemistry: Secondary | ICD-10-CM

## 2015-06-24 ENCOUNTER — Other Ambulatory Visit (INDEPENDENT_AMBULATORY_CARE_PROVIDER_SITE_OTHER): Payer: BC Managed Care – PPO

## 2015-06-24 DIAGNOSIS — Z860101 Personal history of adenomatous and serrated colon polyps: Secondary | ICD-10-CM

## 2015-06-24 DIAGNOSIS — Z86010 Personal history of colonic polyps: Secondary | ICD-10-CM

## 2015-06-24 DIAGNOSIS — R7989 Other specified abnormal findings of blood chemistry: Secondary | ICD-10-CM

## 2015-06-24 DIAGNOSIS — R946 Abnormal results of thyroid function studies: Secondary | ICD-10-CM | POA: Diagnosis not present

## 2015-06-24 HISTORY — DX: Personal history of adenomatous and serrated colon polyps: Z86.0101

## 2015-06-24 HISTORY — PX: COLONOSCOPY W/ POLYPECTOMY: SHX1380

## 2015-06-24 HISTORY — DX: Personal history of colonic polyps: Z86.010

## 2015-06-24 LAB — T4, FREE: FREE T4: 0.72 ng/dL (ref 0.60–1.60)

## 2015-06-24 LAB — TSH: TSH: 2 u[IU]/mL (ref 0.35–4.50)

## 2015-06-25 ENCOUNTER — Telehealth: Payer: Self-pay | Admitting: Nurse Practitioner

## 2015-06-25 DIAGNOSIS — E039 Hypothyroidism, unspecified: Secondary | ICD-10-CM

## 2015-06-25 MED ORDER — LEVOTHYROXINE SODIUM 25 MCG PO TABS: 25.0000 ug | ORAL_TABLET | Freq: Every day | ORAL | Status: DC

## 2015-06-25 NOTE — Telephone Encounter (Signed)
pls call pt: Advise Thyroid at goal. No med adjustment needed. Refill sent in. Pls schedule OV in 3 mos to thyroid & check labs again.

## 2015-06-25 NOTE — Telephone Encounter (Signed)
Left detailed message on pt's cell.  OKay per DPR.

## 2015-06-28 ENCOUNTER — Encounter: Payer: Self-pay | Admitting: Family Medicine

## 2015-07-06 ENCOUNTER — Other Ambulatory Visit: Payer: Self-pay | Admitting: Family Medicine

## 2015-07-06 DIAGNOSIS — R232 Flushing: Secondary | ICD-10-CM

## 2015-07-06 MED ORDER — ESCITALOPRAM OXALATE 10 MG PO TABS: 10.0000 mg | ORAL_TABLET | Freq: Every day | ORAL | Status: DC

## 2015-07-14 ENCOUNTER — Other Ambulatory Visit (INDEPENDENT_AMBULATORY_CARE_PROVIDER_SITE_OTHER): Payer: BC Managed Care – PPO

## 2015-07-14 DIAGNOSIS — E559 Vitamin D deficiency, unspecified: Secondary | ICD-10-CM | POA: Diagnosis not present

## 2015-07-14 DIAGNOSIS — E039 Hypothyroidism, unspecified: Secondary | ICD-10-CM

## 2015-07-14 MED ORDER — LEVOTHYROXINE SODIUM 25 MCG PO TABS: 25.0000 ug | ORAL_TABLET | Freq: Every day | ORAL | Status: DC

## 2015-07-15 LAB — VITAMIN D 25 HYDROXY (VIT D DEFICIENCY, FRACTURES): VITD: 45.97 ng/mL (ref 30.00–100.00)

## 2015-07-16 ENCOUNTER — Telehealth: Payer: Self-pay | Admitting: Family Medicine

## 2015-07-16 NOTE — Telephone Encounter (Signed)
Left detailed message on pt's cell.  Okay per DPR. 

## 2015-07-16 NOTE — Telephone Encounter (Signed)
Please call patient her vitamin D level was good. She should continue to supplement over-the-counter calcium 1000 mg and vitamin D 600-800 mg daily.

## 2015-11-11 ENCOUNTER — Other Ambulatory Visit: Payer: Self-pay | Admitting: *Deleted

## 2015-11-11 DIAGNOSIS — E039 Hypothyroidism, unspecified: Secondary | ICD-10-CM

## 2015-11-11 MED ORDER — LEVOTHYROXINE SODIUM 25 MCG PO TABS: 25.0000 ug | ORAL_TABLET | Freq: Every day | ORAL | Status: DC

## 2015-11-11 NOTE — Telephone Encounter (Signed)
Refilled levothyroxine per refill protocol

## 2015-11-26 ENCOUNTER — Other Ambulatory Visit: Payer: Self-pay | Admitting: *Deleted

## 2015-11-26 DIAGNOSIS — R232 Flushing: Secondary | ICD-10-CM

## 2015-11-26 MED ORDER — ESCITALOPRAM OXALATE 10 MG PO TABS: 10.0000 mg | ORAL_TABLET | Freq: Every day | ORAL | Status: DC

## 2015-11-26 NOTE — Telephone Encounter (Signed)
Patient needs appt prior to anymore refills.

## 2016-02-16 ENCOUNTER — Encounter: Payer: Self-pay | Admitting: Family Medicine

## 2016-02-22 ENCOUNTER — Other Ambulatory Visit: Payer: Self-pay | Admitting: *Deleted

## 2016-02-22 DIAGNOSIS — R232 Flushing: Secondary | ICD-10-CM

## 2016-02-22 MED ORDER — ESCITALOPRAM OXALATE 10 MG PO TABS: 10.0000 mg | ORAL_TABLET | Freq: Every day | ORAL | Status: DC

## 2016-03-21 ENCOUNTER — Telehealth: Payer: Self-pay | Admitting: Family Medicine

## 2016-03-21 DIAGNOSIS — E039 Hypothyroidism, unspecified: Secondary | ICD-10-CM

## 2016-03-21 DIAGNOSIS — R232 Flushing: Secondary | ICD-10-CM

## 2016-03-21 MED ORDER — LEVOTHYROXINE SODIUM 25 MCG PO TABS: 25.0000 ug | ORAL_TABLET | Freq: Every day | ORAL | Status: DC

## 2016-03-21 MED ORDER — ESCITALOPRAM OXALATE 10 MG PO TABS: 10.0000 mg | ORAL_TABLET | Freq: Every day | ORAL | Status: DC

## 2016-03-21 NOTE — Telephone Encounter (Signed)
Caller name:Cynthia [name] Relationship to patient: Can be reached:[phone] Pharmacy:[facility]  Reason for call:Patient states that when Layne left she left a note for Dr Raoul Pitch to call this patient.  Dont see any record of that in the chart.  She states she needs her   levothyroxine     And her Lexapro refilled today as she will be out tomorrow.  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

## 2016-03-21 NOTE — Telephone Encounter (Signed)
30 day supply of patient medications sent to pharmacy. Per Dr Raoul Pitch no more refills until seen for office visit. Left message on patient voice mail.

## 2016-04-13 ENCOUNTER — Encounter: Payer: Self-pay | Admitting: Family Medicine

## 2016-04-13 ENCOUNTER — Ambulatory Visit (INDEPENDENT_AMBULATORY_CARE_PROVIDER_SITE_OTHER): Payer: BC Managed Care – PPO | Admitting: Family Medicine

## 2016-04-13 VITALS — BP 117/78 | HR 60 | Temp 98.0°F | Resp 20 | Wt 224.8 lb

## 2016-04-13 DIAGNOSIS — R946 Abnormal results of thyroid function studies: Secondary | ICD-10-CM

## 2016-04-13 DIAGNOSIS — R232 Flushing: Secondary | ICD-10-CM | POA: Diagnosis not present

## 2016-04-13 DIAGNOSIS — R7989 Other specified abnormal findings of blood chemistry: Secondary | ICD-10-CM

## 2016-04-13 MED ORDER — ESCITALOPRAM OXALATE 10 MG PO TABS: 10.0000 mg | ORAL_TABLET | Freq: Every day | ORAL | Status: DC

## 2016-04-13 NOTE — Patient Instructions (Signed)
Vitamin D 600-800, calcium 1000-1200.   Health Maintenance, Female Adopting a healthy lifestyle and getting preventive care can go a long way to promote health and wellness. Talk with your health care provider about what schedule of regular examinations is right for you. This is a good chance for you to check in with your provider about disease prevention and staying healthy. In between checkups, there are plenty of things you can do on your own. Experts have done a lot of research about which lifestyle changes and preventive measures are most likely to keep you healthy. Ask your health care provider for more information. WEIGHT AND DIET  Eat a healthy diet  Be sure to include plenty of vegetables, fruits, low-fat dairy products, and lean protein.  Do not eat a lot of foods high in solid fats, added sugars, or salt.  Get regular exercise. This is one of the most important things you can do for your health.  Most adults should exercise for at least 150 minutes each week. The exercise should increase your heart rate and make you sweat (moderate-intensity exercise).  Most adults should also do strengthening exercises at least twice a week. This is in addition to the moderate-intensity exercise.  Maintain a healthy weight  Body mass index (BMI) is a measurement that can be used to identify possible weight problems. It estimates body fat based on height and weight. Your health care provider can help determine your BMI and help you achieve or maintain a healthy weight.  For females 14 years of age and older:   A BMI below 18.5 is considered underweight.  A BMI of 18.5 to 24.9 is normal.  A BMI of 25 to 29.9 is considered overweight.  A BMI of 30 and above is considered obese.  Watch levels of cholesterol and blood lipids  You should start having your blood tested for lipids and cholesterol at 60 years of age, then have this test every 5 years.  You may need to have your cholesterol  levels checked more often if:  Your lipid or cholesterol levels are high.  You are older than 60 years of age.  You are at high risk for heart disease.  CANCER SCREENING   Lung Cancer  Lung cancer screening is recommended for adults 18-25 years old who are at high risk for lung cancer because of a history of smoking.  A yearly low-dose CT scan of the lungs is recommended for people who:  Currently smoke.  Have quit within the past 15 years.  Have at least a 30-pack-year history of smoking. A pack year is smoking an average of one pack of cigarettes a day for 1 year.  Yearly screening should continue until it has been 15 years since you quit.  Yearly screening should stop if you develop a health problem that would prevent you from having lung cancer treatment.  Breast Cancer  Practice breast self-awareness. This means understanding how your breasts normally appear and feel.  It also means doing regular breast self-exams. Let your health care provider know about any changes, no matter how small.  If you are in your 20s or 30s, you should have a clinical breast exam (CBE) by a health care provider every 1-3 years as part of a regular health exam.  If you are 66 or older, have a CBE every year. Also consider having a breast X-ray (mammogram) every year.  If you have a family history of breast cancer, talk to your health care  provider about genetic screening.  If you are at high risk for breast cancer, talk to your health care provider about having an MRI and a mammogram every year.  Breast cancer gene (BRCA) assessment is recommended for women who have family members with BRCA-related cancers. BRCA-related cancers include:  Breast.  Ovarian.  Tubal.  Peritoneal cancers.  Results of the assessment will determine the need for genetic counseling and BRCA1 and BRCA2 testing. Cervical Cancer Your health care provider may recommend that you be screened regularly for cancer  of the pelvic organs (ovaries, uterus, and vagina). This screening involves a pelvic examination, including checking for microscopic changes to the surface of your cervix (Pap test). You may be encouraged to have this screening done every 3 years, beginning at age 68.  For women ages 31-65, health care providers may recommend pelvic exams and Pap testing every 3 years, or they may recommend the Pap and pelvic exam, combined with testing for human papilloma virus (HPV), every 5 years. Some types of HPV increase your risk of cervical cancer. Testing for HPV may also be done on women of any age with unclear Pap test results.  Other health care providers may not recommend any screening for nonpregnant women who are considered low risk for pelvic cancer and who do not have symptoms. Ask your health care provider if a screening pelvic exam is right for you.  If you have had past treatment for cervical cancer or a condition that could lead to cancer, you need Pap tests and screening for cancer for at least 20 years after your treatment. If Pap tests have been discontinued, your risk factors (such as having a new sexual partner) need to be reassessed to determine if screening should resume. Some women have medical problems that increase the chance of getting cervical cancer. In these cases, your health care provider may recommend more frequent screening and Pap tests. Colorectal Cancer  This type of cancer can be detected and often prevented.  Routine colorectal cancer screening usually begins at 59 years of age and continues through 60 years of age.  Your health care provider may recommend screening at an earlier age if you have risk factors for colon cancer.  Your health care provider may also recommend using home test kits to check for hidden blood in the stool.  A small camera at the end of a tube can be used to examine your colon directly (sigmoidoscopy or colonoscopy). This is done to check for the  earliest forms of colorectal cancer.  Routine screening usually begins at age 64.  Direct examination of the colon should be repeated every 5-10 years through 60 years of age. However, you may need to be screened more often if early forms of precancerous polyps or small growths are found. Skin Cancer  Check your skin from head to toe regularly.  Tell your health care provider about any new moles or changes in moles, especially if there is a change in a mole's shape or color.  Also tell your health care provider if you have a mole that is larger than the size of a pencil eraser.  Always use sunscreen. Apply sunscreen liberally and repeatedly throughout the day.  Protect yourself by wearing long sleeves, pants, a wide-brimmed hat, and sunglasses whenever you are outside. HEART DISEASE, DIABETES, AND HIGH BLOOD PRESSURE   High blood pressure causes heart disease and increases the risk of stroke. High blood pressure is more likely to develop in:  People who  have blood pressure in the high end of the normal range (130-139/85-89 mm Hg).  People who are overweight or obese.  People who are African American.  If you are 40-39 years of age, have your blood pressure checked every 3-5 years. If you are 66 years of age or older, have your blood pressure checked every year. You should have your blood pressure measured twice--once when you are at a hospital or clinic, and once when you are not at a hospital or clinic. Record the average of the two measurements. To check your blood pressure when you are not at a hospital or clinic, you can use:  An automated blood pressure machine at a pharmacy.  A home blood pressure monitor.  If you are between 19 years and 84 years old, ask your health care provider if you should take aspirin to prevent strokes.  Have regular diabetes screenings. This involves taking a blood sample to check your fasting blood sugar level.  If you are at a normal weight and  have a low risk for diabetes, have this test once every three years after 60 years of age.  If you are overweight and have a high risk for diabetes, consider being tested at a younger age or more often. PREVENTING INFECTION  Hepatitis B  If you have a higher risk for hepatitis B, you should be screened for this virus. You are considered at high risk for hepatitis B if:  You were born in a country where hepatitis B is common. Ask your health care provider which countries are considered high risk.  Your parents were born in a high-risk country, and you have not been immunized against hepatitis B (hepatitis B vaccine).  You have HIV or AIDS.  You use needles to inject street drugs.  You live with someone who has hepatitis B.  You have had sex with someone who has hepatitis B.  You get hemodialysis treatment.  You take certain medicines for conditions, including cancer, organ transplantation, and autoimmune conditions. Hepatitis C  Blood testing is recommended for:  Everyone born from [birth year] through 1965.  Anyone with known risk factors for hepatitis C. Sexually transmitted infections (STIs)  You should be screened for sexually transmitted infections (STIs) including gonorrhea and chlamydia if:  You are sexually active and are younger than 60 years of age.  You are older than 60 years of age and your health care provider tells you that you are at risk for this type of infection.  Your sexual activity has changed since you were last screened and you are at an increased risk for chlamydia or gonorrhea. Ask your health care provider if you are at risk.  If you do not have HIV, but are at risk, it may be recommended that you take a prescription medicine daily to prevent HIV infection. This is called pre-exposure prophylaxis (PrEP). You are considered at risk if:  You are sexually active and do not regularly use condoms or know the HIV status of your partner(s).  You take drugs by  injection.  You are sexually active with a partner who has HIV. Talk with your health care provider about whether you are at high risk of being infected with HIV. If you choose to begin PrEP, you should first be tested for HIV. You should then be tested every 3 months for as long as you are taking PrEP.  PREGNANCY   If you are premenopausal and you may become pregnant, ask your health care provider  about preconception counseling.  If you may become pregnant, take 400 to 800 micrograms (mcg) of folic acid every day.  If you want to prevent pregnancy, talk to your health care provider about birth control (contraception). OSTEOPOROSIS AND MENOPAUSE   Osteoporosis is a disease in which the bones lose minerals and strength with aging. This can result in serious bone fractures. Your risk for osteoporosis can be identified using a bone density scan.  If you are 44 years of age or older, or if you are at risk for osteoporosis and fractures, ask your health care provider if you should be screened.  Ask your health care provider whether you should take a calcium or vitamin D supplement to lower your risk for osteoporosis.  Menopause may have certain physical symptoms and risks.  Hormone replacement therapy may reduce some of these symptoms and risks. Talk to your health care provider about whether hormone replacement therapy is right for you.  HOME CARE INSTRUCTIONS   Schedule regular health, dental, and eye exams.  Stay current with your immunizations.   Do not use any tobacco products including cigarettes, chewing tobacco, or electronic cigarettes.  If you are pregnant, do not drink alcohol.  If you are breastfeeding, limit how much and how often you drink alcohol.  Limit alcohol intake to no more than 1 drink per day for nonpregnant women. One drink equals 12 ounces of beer, 5 ounces of wine, or 1 ounces of hard liquor.  Do not use street drugs.  Do not share needles.  Ask your  health care provider for help if you need support or information about quitting drugs.  Tell your health care provider if you often feel depressed.  Tell your health care provider if you have ever been abused or do not feel safe at home.   This information is not intended to replace advice given to you by your health care provider. Make sure you discuss any questions you have with your health care provider.   Document Released: 05/30/2011 Document Revised: 12/05/2014 Document Reviewed: 10/16/2013 Elsevier Interactive Patient Education [employer].

## 2016-04-13 NOTE — Progress Notes (Signed)
Patient ID: Cynthia [name], female   DOB: Oct 11, 1956, 60 y.o.   MRN: [id number]    Cynthia [name] , [birth year], 60 y.o., female MRN: [id number]  CC: Medication refill --> new provider  Subjective:   Decreased T4: pt states she was started on 25 mcg synthroid about 1 year ago for low energy, weight gain and low T4. With supplementation T4 had been normal. She has lost 2 lbs in 1 year, but reports she is having a hard time making time for her any regimen to help her lose the weight since she is the caregiver and only income for her household since her husband was injured and is disabled. She reports possibly feeling a little more injury with the synthroid. She denies increased flushing, diarrhea, palpitations or anxiety.   Vasomotor flushing: Patient reports compliance with lexapro 10 mg daily for flushing. She states she has definitely noticed a difference in decreasing symptoms since use of medication. She reports still having an infrequent vasomotor flush, but "nothing like it use to be". Pt states she  was on estradiol but did not want hormone therapy. She had experienced early menopause at age 89.   Pt has many questions on preventive health, stroke prevention, Bone health recommendations. Patient is due for her preventive exam mid-June.   No Known Allergies Social History  Substance Use Topics  . Smoking status: Never Smoker   . Smokeless tobacco: Never Used  . Alcohol Use: 2.4 oz/week    4 Glasses of wine per week   Past Medical History  Diagnosis Date  . Arthritis   . 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 surveillance  . Rectocele   . SA node dysfunction ([place])   . History of rheumatic fever     childhood  . Mitral valve prolapse   . Cystocele   . Degenerative disc disease, cervical   . Cervical disc disease   . History of adenomatous polyp of colon 06/24/15  . Primary osteoarthritis of right hip     Intraarticular steroid injection by Dr.  Nelva Bush 01/2016   Past Surgical History  Procedure Laterality Date  . Knee reconstruction Right 2008  . Back surgery  2012    disc rupture  . Liver biopsy  1997    fatty tumors  . Abdominal hysterectomy    . Colonoscopy w/ polypectomy  06/24/15    Recall 5 yrs ([facility])   Family History  Problem Relation Age of Onset  . Alcohol abuse Mother   . CVA Mother   . Cancer Father     prostate  . Arthritis Father   . Heart murmur Son   . Alcohol abuse Sister   . Heart disease Brother     congenital heart defect  . Heart murmur Son   . Heart murmur Son      Medication List       This list is accurate as of: 04/13/16  3:59 PM.  Always use your most recent med list.               escitalopram 10 MG tablet  Commonly known as:  LEXAPRO  Take 1 tablet (10 mg total) by mouth daily.     levothyroxine 25 MCG tablet  Commonly known as:  LEVOTHROID  Take 1 tablet (25 mcg total) by mouth daily before breakfast.       ROS: Negative, with the exception of above mentioned in  HPI Objective:  BP 117/78 mmHg  Pulse 60  Temp(Src) 98 F (36.7 C) (Oral)  Resp 20  Wt 224 lb 12.8 oz (101.969 kg)  SpO2 99% Body mass index is 34.19 kg/(m^2). Gen: Afebrile. No acute distress. Nontoxic in appearance, well developed, well nourished, obese female. Pleasant.  HENT: AT. [place]. MMM, no oral lesions. Eyes:Pupils Equal Round Reactive to light, Extraocular movements intact,  Conjunctiva without redness, discharge or icterus. Neck/lymp/endocrine: Supple,no thyromegaly CV: RRR no murmur, trace edema right LE Chest: CTAB, no wheeze or crackles.  Abd: Soft. NTND. BS present Neuro: Normal gait. PERLA. EOMi. Alert. Oriented x3  Psych: Normal affect, dress and demeanor. Normal speech. Normal thought content and judgment.  Assessment/Plan: Cynthia [name] is a 60 y.o. female present for acute OV for  Vasomotor flushing - refills provided today - escitalopram (LEXAPRO) 10  MG tablet; Take 1 tablet (10 mg total) by mouth daily.  Dispense: 90 tablet; Refill: 3  Low T4 - TSH - T4, free - if no negative SE, any pt feeling benefit outweighs her risk, and she desires to continue I will refill as long as her labs remain normal.   AVS on health maintenance provided to pt today. Discusses Vit D/ Ca recommendations.   Pt to schedule CPE for Mid-June. All preventive health screenings/immunizations will be offered at that time. Pt advised to be fasting with CBC, CMP, a1c, Vit D, b12 and lipids collected at that time. Offer Hep c screen electronically signed by:  Howard Pouch, DO  [employer]

## 2016-04-14 LAB — TSH: TSH: 2.59 u[IU]/mL (ref 0.35–4.50)

## 2016-04-14 LAB — T4, FREE: FREE T4: 0.65 ng/dL (ref 0.60–1.60)

## 2016-04-15 ENCOUNTER — Telehealth: Payer: Self-pay | Admitting: Family Medicine

## 2016-04-15 DIAGNOSIS — E039 Hypothyroidism, unspecified: Secondary | ICD-10-CM

## 2016-04-15 MED ORDER — LEVOTHYROXINE SODIUM 25 MCG PO TABS: 25.0000 ug | ORAL_TABLET | Freq: Every day | ORAL | Status: DC

## 2016-04-15 NOTE — Telephone Encounter (Signed)
Please call pt: - her thyroid studies are normal.  - I have called in refills for her.

## 2016-04-15 NOTE — Telephone Encounter (Signed)
Spoke with patient reviewed lab results . Patient verbalized understanding. 

## 2016-05-18 ENCOUNTER — Encounter: Payer: BC Managed Care – PPO | Admitting: Family Medicine

## 2016-05-25 ENCOUNTER — Encounter: Payer: Self-pay | Admitting: Family Medicine

## 2016-05-25 ENCOUNTER — Ambulatory Visit (INDEPENDENT_AMBULATORY_CARE_PROVIDER_SITE_OTHER): Payer: BC Managed Care – PPO | Admitting: Family Medicine

## 2016-05-25 VITALS — BP 106/70 | HR 59 | Temp 98.5°F | Resp 20 | Ht 68.0 in | Wt 225.2 lb

## 2016-05-25 DIAGNOSIS — E669 Obesity, unspecified: Secondary | ICD-10-CM | POA: Insufficient documentation

## 2016-05-25 DIAGNOSIS — Z130 Encounter for screening for diseases of the blood and blood-forming organs and certain disorders involving the immune mechanism: Secondary | ICD-10-CM

## 2016-05-25 DIAGNOSIS — Z1329 Encounter for screening for other suspected endocrine disorder: Secondary | ICD-10-CM

## 2016-05-25 DIAGNOSIS — R7309 Other abnormal glucose: Secondary | ICD-10-CM | POA: Insufficient documentation

## 2016-05-25 DIAGNOSIS — Z0000 Encounter for general adult medical examination without abnormal findings: Secondary | ICD-10-CM | POA: Diagnosis not present

## 2016-05-25 DIAGNOSIS — Z1159 Encounter for screening for other viral diseases: Secondary | ICD-10-CM

## 2016-05-25 DIAGNOSIS — Z6834 Body mass index (BMI) 34.0-34.9, adult: Secondary | ICD-10-CM

## 2016-05-25 DIAGNOSIS — R946 Abnormal results of thyroid function studies: Secondary | ICD-10-CM

## 2016-05-25 DIAGNOSIS — M1990 Unspecified osteoarthritis, unspecified site: Secondary | ICD-10-CM | POA: Insufficient documentation

## 2016-05-25 DIAGNOSIS — R7989 Other specified abnormal findings of blood chemistry: Secondary | ICD-10-CM

## 2016-05-25 DIAGNOSIS — Z79899 Other long term (current) drug therapy: Secondary | ICD-10-CM | POA: Diagnosis not present

## 2016-05-25 DIAGNOSIS — Z13220 Encounter for screening for lipoid disorders: Secondary | ICD-10-CM

## 2016-05-25 DIAGNOSIS — R7303 Prediabetes: Secondary | ICD-10-CM

## 2016-05-25 DIAGNOSIS — M159 Polyosteoarthritis, unspecified: Secondary | ICD-10-CM

## 2016-05-25 LAB — COMPREHENSIVE METABOLIC PANEL
ALBUMIN: 4.7 g/dL (ref 3.5–5.2)
ALT: 16 U/L (ref 0–35)
AST: 16 U/L (ref 0–37)
Alkaline Phosphatase: 102 U/L (ref 39–117)
BUN: 19 mg/dL (ref 6–23)
CHLORIDE: 104 meq/L (ref 96–112)
CO2: 24 mEq/L (ref 19–32)
Calcium: 9.8 mg/dL (ref 8.4–10.5)
Creatinine, Ser: 0.73 mg/dL (ref 0.40–1.20)
GFR: 86.55 mL/min (ref >60.00)
GLUCOSE: 106 mg/dL — AB (ref 70–99)
POTASSIUM: 4.4 meq/L (ref 3.5–5.1)
SODIUM: 138 meq/L (ref 135–145)
Total Bilirubin: 0.6 mg/dL (ref 0.2–1.2)
Total Protein: 7.3 g/dL (ref 6.0–8.3)

## 2016-05-25 LAB — LIPID PANEL
Cholesterol: 206 mg/dL — ABNORMAL HIGH (ref 0–200)
HDL: 70.7 mg/dL (ref >39.00)
LDL Cholesterol: 109 mg/dL — ABNORMAL HIGH (ref 0–99)
NONHDL: 135.67
Total CHOL/HDL Ratio: 3
Triglycerides: 135 mg/dL (ref 0.0–149.0)
VLDL: 27 mg/dL (ref 0.0–40.0)

## 2016-05-25 LAB — TSH: TSH: 2.48 u[IU]/mL (ref 0.35–4.50)

## 2016-05-25 LAB — CBC WITH DIFFERENTIAL/PLATELET
BASOS ABS: 0 10*3/uL (ref 0.0–0.1)
Basophils Relative: 0.6 % (ref 0.0–3.0)
EOS PCT: 3 % (ref 0.0–5.0)
Eosinophils Absolute: 0.2 10*3/uL (ref 0.0–0.7)
HEMATOCRIT: 41.4 % (ref 36.0–46.0)
Hemoglobin: 13.7 g/dL (ref 12.0–15.0)
LYMPHS PCT: 40.1 % (ref 12.0–46.0)
Lymphs Abs: 2.8 10*3/uL (ref 0.7–4.0)
MCHC: 33.1 g/dL (ref 30.0–36.0)
MCV: 90.4 fl (ref 78.0–100.0)
MONOS PCT: 6.1 % (ref 3.0–12.0)
Monocytes Absolute: 0.4 10*3/uL (ref 0.1–1.0)
Neutro Abs: 3.4 10*3/uL (ref 1.4–7.7)
Neutrophils Relative %: 50.2 % (ref 43.0–77.0)
Platelets: 294 10*3/uL (ref 150.0–400.0)
RBC: 4.58 Mil/uL (ref 3.87–5.11)
RDW: 14 % (ref 11.5–15.5)
WBC: 6.9 10*3/uL (ref 4.0–10.5)

## 2016-05-25 LAB — HEMOGLOBIN A1C: Hgb A1c MFr Bld: 5.6 % (ref 4.6–6.5)

## 2016-05-25 NOTE — Patient Instructions (Signed)
Health Maintenance, Female Adopting a healthy lifestyle and getting preventive care can go a long way to promote health and wellness. Talk with your health care provider about what schedule of regular examinations is right for you. This is a good chance for you to check in with your provider about disease prevention and staying healthy. In between checkups, there are plenty of things you can do on your own. Experts have done a lot of research about which lifestyle changes and preventive measures are most likely to keep you healthy. Ask your health care provider for more information. WEIGHT AND DIET  Eat a healthy diet  Be sure to include plenty of vegetables, fruits, low-fat dairy products, and lean protein.  Do not eat a lot of foods high in solid fats, added sugars, or salt.  Get regular exercise. This is one of the most important things you can do for your health.  Most adults should exercise for at least 150 minutes each week. The exercise should increase your heart rate and make you sweat (moderate-intensity exercise).  Most adults should also do strengthening exercises at least twice a week. This is in addition to the moderate-intensity exercise.  Maintain a healthy weight  Body mass index (BMI) is a measurement that can be used to identify possible weight problems. It estimates body fat based on height and weight. Your health care provider can help determine your BMI and help you achieve or maintain a healthy weight.  For females 45 years of age and older:   A BMI below 18.5 is considered underweight.  A BMI of 18.5 to 24.9 is normal.  A BMI of 25 to 29.9 is considered overweight.  A BMI of 30 and above is considered obese.  Watch levels of cholesterol and blood lipids  You should start having your blood tested for lipids and cholesterol at 60 years of age, then have this test every 5 years.  You may need to have your cholesterol levels checked more often if:  Your lipid  or cholesterol levels are high.  You are older than 60 years of age.  You are at high risk for heart disease.  CANCER SCREENING   Lung Cancer  Lung cancer screening is recommended for adults 67-20 years old who are at high risk for lung cancer because of a history of smoking.  A yearly low-dose CT scan of the lungs is recommended for people who:  Currently smoke.  Have quit within the past 15 years.  Have at least a 30-pack-year history of smoking. A pack year is smoking an average of one pack of cigarettes a day for 1 year.  Yearly screening should continue until it has been 15 years since you quit.  Yearly screening should stop if you develop a health problem that would prevent you from having lung cancer treatment.  Breast Cancer  Practice breast self-awareness. This means understanding how your breasts normally appear and feel.  It also means doing regular breast self-exams. Let your health care provider know about any changes, no matter how small.  If you are in your 20s or 30s, you should have a clinical breast exam (CBE) by a health care provider every 1-3 years as part of a regular health exam.  If you are 36 or older, have a CBE every year. Also consider having a breast X-ray (mammogram) every year.  If you have a family history of breast cancer, talk to your health care provider about genetic screening.  If you  are at high risk for breast cancer, talk to your health care provider about having an MRI and a mammogram every year.  Breast cancer gene (BRCA) assessment is recommended for women who have family members with BRCA-related cancers. BRCA-related cancers include:  Breast.  Ovarian.  Tubal.  Peritoneal cancers.  Results of the assessment will determine the need for genetic counseling and BRCA1 and BRCA2 testing. Cervical Cancer Your health care provider may recommend that you be screened regularly for cancer of the pelvic organs (ovaries, uterus, and  vagina). This screening involves a pelvic examination, including checking for microscopic changes to the surface of your cervix (Pap test). You may be encouraged to have this screening done every 3 years, beginning at age 32.  For women ages 43-65, health care providers may recommend pelvic exams and Pap testing every 3 years, or they may recommend the Pap and pelvic exam, combined with testing for human papilloma virus (HPV), every 5 years. Some types of HPV increase your risk of cervical cancer. Testing for HPV may also be done on women of any age with unclear Pap test results.  Other health care providers may not recommend any screening for nonpregnant women who are considered low risk for pelvic cancer and who do not have symptoms. Ask your health care provider if a screening pelvic exam is right for you.  If you have had past treatment for cervical cancer or a condition that could lead to cancer, you need Pap tests and screening for cancer for at least 20 years after your treatment. If Pap tests have been discontinued, your risk factors (such as having a new sexual partner) need to be reassessed to determine if screening should resume. Some women have medical problems that increase the chance of getting cervical cancer. In these cases, your health care provider may recommend more frequent screening and Pap tests. Colorectal Cancer  This type of cancer can be detected and often prevented.  Routine colorectal cancer screening usually begins at 60 years of age and continues through 60 years of age.  Your health care provider may recommend screening at an earlier age if you have risk factors for colon cancer.  Your health care provider may also recommend using home test kits to check for hidden blood in the stool.  A small camera at the end of a tube can be used to examine your colon directly (sigmoidoscopy or colonoscopy). This is done to check for the earliest forms of colorectal  cancer.  Routine screening usually begins at age 40.  Direct examination of the colon should be repeated every 5-10 years through 60 years of age. However, you may need to be screened more often if early forms of precancerous polyps or small growths are found. Skin Cancer  Check your skin from head to toe regularly.  Tell your health care provider about any new moles or changes in moles, especially if there is a change in a mole's shape or color.  Also tell your health care provider if you have a mole that is larger than the size of a pencil eraser.  Always use sunscreen. Apply sunscreen liberally and repeatedly throughout the day.  Protect yourself by wearing long sleeves, pants, a wide-brimmed hat, and sunglasses whenever you are outside. HEART DISEASE, DIABETES, AND HIGH BLOOD PRESSURE   High blood pressure causes heart disease and increases the risk of stroke. High blood pressure is more likely to develop in:  People who have blood pressure in the high end  of the normal range (130-139/85-89 mm Hg).  People who are overweight or obese.  People who are African American.  If you are 38-23 years of age, have your blood pressure checked every 3-5 years. If you are 61 years of age or older, have your blood pressure checked every year. You should have your blood pressure measured twice--once when you are at a hospital or clinic, and once when you are not at a hospital or clinic. Record the average of the two measurements. To check your blood pressure when you are not at a hospital or clinic, you can use:  An automated blood pressure machine at a pharmacy.  A home blood pressure monitor.  If you are between 45 years and 39 years old, ask your health care provider if you should take aspirin to prevent strokes.  Have regular diabetes screenings. This involves taking a blood sample to check your fasting blood sugar level.  If you are at a normal weight and have a low risk for diabetes,  have this test once every three years after 60 years of age.  If you are overweight and have a high risk for diabetes, consider being tested at a younger age or more often. PREVENTING INFECTION  Hepatitis B  If you have a higher risk for hepatitis B, you should be screened for this virus. You are considered at high risk for hepatitis B if:  You were born in a country where hepatitis B is common. Ask your health care provider which countries are considered high risk.  Your parents were born in a high-risk country, and you have not been immunized against hepatitis B (hepatitis B vaccine).  You have HIV or AIDS.  You use needles to inject street drugs.  You live with someone who has hepatitis B.  You have had sex with someone who has hepatitis B.  You get hemodialysis treatment.  You take certain medicines for conditions, including cancer, organ transplantation, and autoimmune conditions. Hepatitis C  Blood testing is recommended for:  Everyone born from [birth year] through 1965.  Anyone with known risk factors for hepatitis C. Sexually transmitted infections (STIs)  You should be screened for sexually transmitted infections (STIs) including gonorrhea and chlamydia if:  You are sexually active and are younger than 60 years of age.  You are older than 60 years of age and your health care provider tells you that you are at risk for this type of infection.  Your sexual activity has changed since you were last screened and you are at an increased risk for chlamydia or gonorrhea. Ask your health care provider if you are at risk.  If you do not have HIV, but are at risk, it may be recommended that you take a prescription medicine daily to prevent HIV infection. This is called pre-exposure prophylaxis (PrEP). You are considered at risk if:  You are sexually active and do not regularly use condoms or know the HIV status of your partner(s).  You take drugs by injection.  You are sexually  active with a partner who has HIV. Talk with your health care provider about whether you are at high risk of being infected with HIV. If you choose to begin PrEP, you should first be tested for HIV. You should then be tested every 3 months for as long as you are taking PrEP.  PREGNANCY   If you are premenopausal and you may become pregnant, ask your health care provider about preconception counseling.  If you may  become pregnant, take 400 to 800 micrograms (mcg) of folic acid every day.  If you want to prevent pregnancy, talk to your health care provider about birth control (contraception). OSTEOPOROSIS AND MENOPAUSE   Osteoporosis is a disease in which the bones lose minerals and strength with aging. This can result in serious bone fractures. Your risk for osteoporosis can be identified using a bone density scan.  If you are 61 years of age or older, or if you are at risk for osteoporosis and fractures, ask your health care provider if you should be screened.  Ask your health care provider whether you should take a calcium or vitamin D supplement to lower your risk for osteoporosis.  Menopause may have certain physical symptoms and risks.  Hormone replacement therapy may reduce some of these symptoms and risks. Talk to your health care provider about whether hormone replacement therapy is right for you.  HOME CARE INSTRUCTIONS   Schedule regular health, dental, and eye exams.  Stay current with your immunizations.   Do not use any tobacco products including cigarettes, chewing tobacco, or electronic cigarettes.  If you are pregnant, do not drink alcohol.  If you are breastfeeding, limit how much and how often you drink alcohol.  Limit alcohol intake to no more than 1 drink per day for nonpregnant women. One drink equals 12 ounces of beer, 5 ounces of wine, or 1 ounces of hard liquor.  Do not use street drugs.  Do not share needles.  Ask your health care provider for help if  you need support or information about quitting drugs.  Tell your health care provider if you often feel depressed.  Tell your health care provider if you have ever been abused or do not feel safe at home.   This information is not intended to replace advice given to you by your health care provider. Make sure you discuss any questions you have with your health care provider.   Document Released: 05/30/2011 Document Revised: 12/05/2014 Document Reviewed: 10/16/2013 Elsevier Interactive Patient Education [employer].

## 2016-05-25 NOTE — Progress Notes (Signed)
Patient ID: Cynthia [name], female   DOB: 06-03-1956, 60 y.o.   MRN: [id number]      Patient ID: Cynthia [name], female  DOB: 02/12/1956, 60 y.o.   MRN: [id number]  Subjective:  Cynthia [name] is a 60 y.o.  female present for annual exam All past medical history, surgical history, allergies, family history, immunizations, medications and social history were updated in the electronic medical record today. All recent labs, ED visits and hospitalizations within the last year were reviewed.  Patient Care Team    Relationship Specialty Notifications Start End  Ma Hillock, DO PCP - General Family Medicine  07/14/15   Rosana Berger, MD Consulting Physician Gastroenterology  06/29/15   Melina Schools, MD Consulting Physician Orthopedic Surgery  04/13/16     She is in a study Tanezumab for osteoarthritis.   Health maintenance:  Colonoscopy: UTD 2016, Dr. Glennon Hamilton. Polyps present. Pt states 5 year f/u. Mammogram: Patient will like to be screened every 2 years, last mammogram 05/19/2015. BI-RADS 1. No family history present. Cervical cancer screening: Hysterectomy, Pap smear not indicated. Immunizations: Tetanus completed 2009, influenza encouraged yearly. Infectious disease screening: HIV completed, hepatitis C screening indicated. DEXA: N/A Assistive device: None Oxygen use: None Patient has a Dental home. Hospitalizations/ED visits: None  Past Medical History  Diagnosis Date  . Arthritis   . 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 surveillance  . Rectocele   . SA node dysfunction ([place])   . History of rheumatic fever     childhood  . Mitral valve prolapse   . Cystocele   . Cervical disc disease   . History of adenomatous polyp of colon 06/24/15  . Primary osteoarthritis of right hip     Intraarticular steroid injection by Dr. Nelva Bush 01/2016  . Rectocele    No Known Allergies Past Surgical History  Procedure Laterality Date  . Knee reconstruction  Right 2008  . Back surgery  2012    disc rupture  . Liver biopsy  1997    fatty tumors  . Abdominal hysterectomy    . Colonoscopy w/ polypectomy  06/24/15    Recall 5 yrs ([facility])   Family History  Problem Relation Age of Onset  . Alcohol abuse Mother   . CVA Mother   . Cancer Father     prostate  . Arthritis Father   . Heart murmur Son   . Alcohol abuse Sister   . Heart disease Brother     congenital heart defect  . Heart murmur Son   . Heart murmur Son    Social History   Social History  . Marital Status: Married    Spouse Name: [name]  . Number of Children: 3  . Years of Education: N/A   Occupational History  . [employer]   Social History Main Topics  . Smoking status: Never Smoker   . Smokeless tobacco: Never Used  . Alcohol Use: 2.4 oz/week    4 Glasses of wine per week  . Drug Use: No  . Sexual Activity: No   Other Topics Concern  . Not on file   Social History Narrative   [name] lives in [place] with her husband.  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 She is from [place].     ROS: 14 pt review of systems  performed and negative (unless mentioned in an HPI)  Objective: BP 106/70 mmHg  Pulse 59  Temp(Src) 98.5 F (36.9 C)  Resp 20  Ht 5\' 8"  (1.727 m)  Wt 225 lb 4 oz (102.173 kg)  BMI 34.26 kg/m2  SpO2 97% Gen: Afebrile. No acute distress. Nontoxic in appearance, well-developed, well-nourished, female, Pleasant HENT: AT. [place]. Bilateral TM visualized and normal in appearance, normal external auditory canal. MMM, no oral lesions, adequate dentition. Bilateral nares within normal limits. Throat without erythema, ulcerations or exudates. No Cough on exam, no hoarseness on exam. Eyes:Pupils Equal Round Reactive to light, Extraocular movements intact,  Conjunctiva without redness, discharge or icterus. Neck/lymp/endocrine:  Supple, no lymphadenopathy, no thyromegaly CV: RRR no murmur appreciated, no edema, +2/4 P posterior tibialis pulses. . Chest: CTAB, no wheeze, rhonchi or crackles. Normal Respiratory effort. Good Air movement. Abd: Soft. Obese. NTND. BS present. No Masses palpated. No hepatosplenomegaly. No rebound tenderness or guarding. Skin: No rashes, purpura or petechiae. Warm and well-perfused. Skin intact. Neuro/Msk:  Normal gait. PERLA. EOMi. Alert. Oriented x3.  Cranial nerves II through XII intact. Muscle strength 5/5 upper and lower extremity. DTRs equal bilaterally. Psych: Normal affect, dress and demeanor. Normal speech. Normal thought content and judgment.    Assessment/plan: Cynthia [name] is a 60 y.o. female present for annual exam.  Need for hepatitis C screening test - Patient agreeable to screening - Hepatitis C Antibody  Routine general medical examination at a health care facility Screening, anemia, deficiency, iron  Encounter for long-term (current) use of medications Thyroid disorder screen/Low T4 Screening cholesterol level BMI greater than 34 Patient was encouraged to exercise greater than 150 minutes a week. Patient was encouraged to choose a diet filled with fresh fruits and vegetables, and lean meats. AVS provided to patient today for education/recommendation on gender specific health and safety maintenance Colonoscopy: UTD 2016, Dr. Glennon Hamilton. Polyps present. Pt states 5 year f/u. Mammogram: Patient will like to be screened every 2 years, last mammogram 05/19/2015. BI-RADS 1. No family history present. Cervical cancer screening: Hysterectomy, Pap smear not indicated. Immunizations: Tetanus completed 2009, influenza encouraged yearly. Infectious disease screening: HIV completed, hepatitis C screening indicated. DEXA: N/A - Hepatitis C Antibody - CBC w/Diff - Comprehensive metabolic panel - TSH - Lipid panel  Elevated hemoglobin A1c/prediabetes - HgB A1c - Diet and  exercise encouraged. - follow-up every 6 months  Osteoarthritis of multiple joints, unspecified osteoarthritis type - Patient started a study at? [facility] and will be starting Tanezumab    . Return in about 6 months (around 11/24/2016) for Prediabetes.  Electronically signed by: Howard Pouch, DO [place]

## 2016-05-26 ENCOUNTER — Telehealth: Payer: Self-pay | Admitting: Family Medicine

## 2016-05-26 LAB — HEPATITIS C ANTIBODY: HCV Ab: NEGATIVE

## 2016-05-26 NOTE — Telephone Encounter (Signed)
Left a message with lab results and instructions .

## 2016-05-26 NOTE — Telephone Encounter (Signed)
Please call pt: - labs look good. A1c down to 5.6 (prior 5.8). - Follow up yearly, or if needed.

## 2016-09-14 ENCOUNTER — Ambulatory Visit (INDEPENDENT_AMBULATORY_CARE_PROVIDER_SITE_OTHER): Payer: BC Managed Care – PPO | Admitting: Obstetrics and Gynecology

## 2016-09-14 ENCOUNTER — Encounter: Payer: Self-pay | Admitting: Obstetrics and Gynecology

## 2016-09-14 VITALS — BP 108/66 | HR 68 | Resp 12 | Ht 67.5 in | Wt 230.0 lb

## 2016-09-14 DIAGNOSIS — N816 Rectocele: Secondary | ICD-10-CM

## 2016-09-14 DIAGNOSIS — N993 Prolapse of vaginal vault after hysterectomy: Secondary | ICD-10-CM

## 2016-09-14 NOTE — Progress Notes (Signed)
GYNECOLOGY  VISIT   HPI: 60 y.o.   Married  Caucasian  female   G3P3 with No LMP recorded. Patient has had a hysterectomy.   here for evaluation of rectocele.    Has had for 20 years.   Feels and sees a bulge.  Having localized bleeding.  Does manual manipulation in the vagina to have bowel movements.  Having incomplete evacuation.  No urinary incontinence or voiding difficulty. Up twice a night to void.   Status post TVH due to prolapse.  No support procedure done.  Ovaries remain.   Not as active as she would like to be.  Has had knee issues.   Difficulty with weight loss.  Had gained 80 pounds in her adult life since her last delivery.   Had extensive tearing with second and third delivery.  One child was 11 pounds.   GYNECOLOGIC HISTORY: No LMP recorded. Patient has had a hysterectomy. Contraception:  Hysterectomy Menopausal hormone therapy:  none Last mammogram:  05-20-15 Density  B/Neg/BiRads1:[facility] Last pap smear:   Years ago--normal per patient        OB History    Gravida Para Term Preterm AB Living   3 3       3    SAB TAB Ectopic Multiple Live Births           3         Patient Active Problem List   Diagnosis Date Noted  . Osteoarthritis 05/25/2016  . Elevated hemoglobin A1c 05/25/2016  . BMI 34.0-34.9,adult 05/25/2016  . Breast cancer screening 05/12/2015  . Low T4 05/12/2015  . Vitamin D deficiency 05/12/2015  . Prediabetes 05/12/2015  . History of colon polyps 05/27/2014  . Routine general medical examination at a health care facility 05/27/2014  . Vasomotor flushing 03/17/2014  . Personal history of cardiac arrhythmia 03/17/2014    Past Medical History:  Diagnosis Date  . Arthritis   . Cervical disc disease   . Cystocele   . Heart murmur   . History of adenomatous polyp of colon 06/24/15  . History of rheumatic fever    childhood  . 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 surveillance  . Mitral  valve prolapse   . Primary osteoarthritis of right hip    Intraarticular steroid injection by Dr. Nelva Bush 01/2016  . Rectocele   . Rectocele   . SA node dysfunction [facility])     Past Surgical History:  Procedure Laterality Date  . ABDOMINAL HYSTERECTOMY  05/1996   TVH--ovaries remain  . arthroscopic knee Right   . BACK SURGERY  2012   disc rupture  . COLONOSCOPY W/ POLYPECTOMY  06/24/15   Recall 5 yrs (Dr. [facility])  . KNEE RECONSTRUCTION Right 2008  . LIVER BIOPSY  1997   fatty tumors    Current Outpatient Prescriptions  Medication Sig Dispense Refill  . aspirin 81 MG tablet Take 81 mg by mouth daily.    . cholecalciferol (VITAMIN D) 400 units TABS tablet Take 800 Units by mouth.    . escitalopram (LEXAPRO) 10 MG tablet Take 1 tablet (10 mg total) by mouth daily. 90 tablet 3  . levothyroxine (LEVOTHROID) 25 MCG tablet Take 1 tablet (25 mcg total) by mouth daily before breakfast. 90 tablet 3   No current facility-administered medications for this visit.      ALLERGIES: Review of patient's allergies indicates no known allergies.  Family History  Problem  Relation Age of Onset  . Alcohol abuse Mother   . CVA Mother     Dec 65  . Heart disease Mother   . Cancer Father     prostate  . Arthritis Father   . Heart murmur Son   . Alcohol abuse Sister   . Heart disease Brother     congenital heart defect--Dec age 59 from MI  . Heart murmur Son   . Heart murmur Son   . Cancer Maternal Aunt 50    Dec colon CA age 31  . Cancer Cousin 19    Dec colon CA age 25    Social History   Social History  . Marital status: Married    Spouse name: [name]  . Number of children: 3  . Years of education: N/A   Occupational History  . [employer]   Social History Main Topics  . Smoking status: Never Smoker  . Smokeless tobacco: Never Used  . Alcohol use 2.4 oz/week    4 Glasses of wine per week  . Drug use: No  . Sexual activity: No      Comment: Hyst   Other Topics Concern  . Not on file   Social History Narrative   Ms. Bentler lives in [place] with her husband.  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 She is from [place].    ROS:  Pertinent items are noted in HPI.  PHYSICAL EXAMINATION:    BP 108/66 (BP Location: Right Arm, Patient Position: Sitting, Cuff Size: Large)   Pulse 68   Resp 12   Ht 5' 7.5" (1.715 m)   Wt 230 lb (104.3 kg)   BMI 35.49 kg/m     General appearance: alert, cooperative and appears stated age Head: Normocephalic, without obvious abnormality, atraumatic Neck: no adenopathy, supple, symmetrical, trachea midline and thyroid normal to inspection and palpation Lungs: clear to auscultation bilaterally Heart: regular rate and rhythm Abdomen: soft, non-tender, no masses,  no organomegaly Extremities: extremities normal, atraumatic, no cyanosis or edema Skin: Skin color, texture, turgor normal. No rashes or lesions Lymph nodes: Cervical, supraclavicular, and axillary nodes normal. No abnormal inguinal nodes palpated Neurologic: Grossly normal  Pelvic: External genitalia:  no lesions              Urethra:  normal appearing urethra with no masses, tenderness or lesions              Bartholins and Skenes: normal                 Vagina: normal appearing vagina with normal color and discharge, no lesions              Cervix:  Absent.                Bimanual Exam:  Uterus:   Absent.              Third degree rectocele.  First degree vault prolapse.  Minimal cystocele.  Examined lying and standing.              Adnexa: no mass, fullness, tenderness              Rectal exam: Yes.  .  Confirms.              Anus:  normal sphincter tone, no lesions  Chaperone was present for exam.  ASSESSMENT  Status  post TVH.  Rectocele and vault prolapase.  PLAN  Comprehensive discussion of pelvic organ prolapse - etiologies, symptoms  and tx options. We discussed observation, pessary use (which I showed her), and surgical correction.  We talked about the importance of vaginal vault prolapse and not just a rectocele repair.  Surgery can be performed vaginally with a sacrospinous fixation and rectocele repair or as a sacrocolpopexy with rectocele repair.  We highlighted the differences in the two surgical approaches and the fixation points for the vaginal cuff.    Patient is going to work on weight loss and return for a trial of a pessary.   She is a Pharmacist, hospital, and would consider surgery for next summer if she is going to proceed.   ACOG handouts on prolapse and surgical correction of pelvic organ prolapse to pt.   An After Visit Summary was printed and given to the patient. ___30___ minutes face to face time of which over 50% was spent in counseling.

## 2016-09-21 ENCOUNTER — Encounter: Payer: Self-pay | Admitting: Obstetrics and Gynecology

## 2016-09-21 ENCOUNTER — Ambulatory Visit (INDEPENDENT_AMBULATORY_CARE_PROVIDER_SITE_OTHER): Payer: BC Managed Care – PPO | Admitting: Obstetrics and Gynecology

## 2016-09-21 VITALS — BP 126/70 | HR 60 | Ht 67.5 in | Wt 230.8 lb

## 2016-09-21 DIAGNOSIS — N993 Prolapse of vaginal vault after hysterectomy: Secondary | ICD-10-CM

## 2016-09-21 DIAGNOSIS — N816 Rectocele: Secondary | ICD-10-CM | POA: Diagnosis not present

## 2016-09-21 NOTE — Progress Notes (Signed)
GYNECOLOGY  VISIT   HPI: 60 y.o.   Married  Caucasian  female   G3P3 with No LMP recorded. Patient has had a hysterectomy.   here for pessary fitting.    Hx TVH. Has first degree vault prolapse and third degree rectocele.  Minimal cystocele.   No urinary incontinence.   GYNECOLOGIC HISTORY: No LMP recorded. Patient has had a hysterectomy. Contraception:  Hysterectomy Menopausal hormone therapy:  none Last mammogram:  05-20-15 Density B/Neg/BiRads1:[facility] Last pap smear:  Years ago--normal per patient        OB History    Gravida Para Term Preterm AB Living   3 3       3    SAB TAB Ectopic Multiple Live Births           3         Patient Active Problem List   Diagnosis Date Noted  . Osteoarthritis 05/25/2016  . Elevated hemoglobin A1c 05/25/2016  . BMI 34.0-34.9,adult 05/25/2016  . Breast cancer screening 05/12/2015  . Low T4 05/12/2015  . Vitamin D deficiency 05/12/2015  . Prediabetes 05/12/2015  . History of colon polyps 05/27/2014  . Routine general medical examination at a health care facility 05/27/2014  . Vasomotor flushing 03/17/2014  . Personal history of cardiac arrhythmia 03/17/2014    Past Medical History:  Diagnosis Date  . Arthritis   . Cervical disc disease   . Cystocele   . Heart murmur   . History of adenomatous polyp of colon 06/24/15  . History of rheumatic fever    childhood  . 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 surveillance  . Mitral valve prolapse   . Primary osteoarthritis of right hip    Intraarticular steroid injection by Dr. Nelva Bush 01/2016  . Rectocele   . Rectocele   . SA node dysfunction [facility])     Past Surgical History:  Procedure Laterality Date  . ABDOMINAL HYSTERECTOMY  05/1996   TVH--ovaries remain  . arthroscopic knee Right   . BACK SURGERY  2012   disc rupture  . COLONOSCOPY W/ POLYPECTOMY  06/24/15   Recall 5 yrs (Dr. [facility])  . KNEE RECONSTRUCTION  Right 2008  . LIVER BIOPSY  1997   fatty tumors    Current Outpatient Prescriptions  Medication Sig Dispense Refill  . aspirin 81 MG tablet Take 81 mg by mouth daily.    . Calcium-Magnesium-Vitamin D (CALCIUM 1200+D3 PO) Take by mouth. Takes a total of 1200mg  daily    . cholecalciferol (VITAMIN D) 400 units TABS tablet Take 800 Units by mouth.    . escitalopram (LEXAPRO) 10 MG tablet Take 1 tablet (10 mg total) by mouth daily. 90 tablet 3  . levothyroxine (LEVOTHROID) 25 MCG tablet Take 1 tablet (25 mcg total) by mouth daily before breakfast. 90 tablet 3   No current facility-administered medications for this visit.      ALLERGIES: Review of patient's allergies indicates no known allergies.  Family History  Problem Relation Age of Onset  . Alcohol abuse Mother   . CVA Mother     Dec 65  . Heart disease Mother   . Cancer Father     prostate  . Arthritis Father   . Heart murmur Son   . Alcohol abuse Sister   . Heart disease Brother     congenital heart defect--Dec age 70 from MI  . Heart murmur Son   . Heart  murmur Son   . Cancer Maternal Aunt 50    Dec colon CA age 26  . Cancer Cousin 61    Dec colon CA age 33    Social History   Social History  . Marital status: Married    Spouse name: [name]  . Number of children: 3  . Years of education: N/A   Occupational History  . [employer]   Social History Main Topics  . Smoking status: Never Smoker  . Smokeless tobacco: Never Used  . Alcohol use 2.4 oz/week    4 Glasses of wine per week  . Drug use: No  . Sexual activity: No     Comment: Hyst   Other Topics Concern  . Not on file   Social History Narrative   Ms. Vannest lives in [place] with her husband.  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 She is from [place].    ROS:  Pertinent items are noted in HPI.  PHYSICAL EXAMINATION:    BP 126/70 (BP Location: Right  Arm, Patient Position: Sitting, Cuff Size: Large)   Pulse 60   Ht 5' 7.5" (1.715 m)   Wt 230 lb 12.8 oz (104.7 kg)   BMI 35.61 kg/m     General appearance: alert, cooperative and appears stated age    Pelvic: External genitalia:  no lesions              Urethra:  normal appearing urethra with no masses, tenderness or lesions              Bartholins and Skenes: normal                 Vagina: normal appearing vagina with normal color and discharge, no lesions              Cervix:  Absent.                Bimanual Exam:  Uterus:   Absent.               Adnexa: no mass, fullness, tenderness       #4 ring support comfortable and fitted well.   Comfortable for patient, but not painful. #3 ring with support is too small and a Gelhorn was uncomfortable.         Chaperone was present for exam.  ASSESSMENT  Third degree rectocele.  First degree vaginal vault prolapse.  PLAN  We will order a #4 ring with support pessary. Patient would like office to order this for her.  I discussed frequency of removal.of the pessary.  She is considering surgery for next summer but also needs a knee replacement at the same time.   An After Visit Summary was printed and given to the patient.  _15_____ minutes face to face time of which over 50% was spent in counseling.

## 2016-09-22 ENCOUNTER — Telehealth: Payer: Self-pay

## 2016-09-22 NOTE — Telephone Encounter (Signed)
Called patient at [phone] to let her know we have received her pessary. Left message for patient to call me to make appointment for pessary insertion.

## 2016-09-23 NOTE — Telephone Encounter (Signed)
Patient called back and scheduled an appointment for 09/28/16 with Dr. Quincy Simmonds at 3:30 PM. Patient appreciative.  Routing to [place] for [employer].

## 2016-09-23 NOTE — Telephone Encounter (Signed)
Closed encounter °

## 2016-09-28 ENCOUNTER — Ambulatory Visit (INDEPENDENT_AMBULATORY_CARE_PROVIDER_SITE_OTHER): Payer: BC Managed Care – PPO | Admitting: Obstetrics and Gynecology

## 2016-09-28 ENCOUNTER — Encounter: Payer: Self-pay | Admitting: Obstetrics and Gynecology

## 2016-09-28 VITALS — BP 118/70 | HR 64 | Ht 67.5 in | Wt 233.0 lb

## 2016-09-28 DIAGNOSIS — N993 Prolapse of vaginal vault after hysterectomy: Secondary | ICD-10-CM

## 2016-09-28 DIAGNOSIS — N816 Rectocele: Secondary | ICD-10-CM

## 2016-09-28 NOTE — Progress Notes (Signed)
GYNECOLOGY  VISIT   HPI: 60 y.o.   Married  Caucasian  female   G3P3 with No LMP recorded. Patient has had a hysterectomy.   here for pessary insertion.    Has pelvic organ prolapse and no urinary incontinence.   Used estradiol in the past.   GYNECOLOGIC HISTORY: No LMP recorded. Patient has had a hysterectomy. Contraception:  Hysterectomy Menopausal hormone therapy:  none Last mammogram:  05-20-15 Density B/Neg/BiRads1:[facility]  Last pap smear: Years ago--normal per patient          OB History    Gravida Para Term Preterm AB Living   3 3       3    SAB TAB Ectopic Multiple Live Births           3         Patient Active Problem List   Diagnosis Date Noted  . Osteoarthritis 05/25/2016  . Elevated hemoglobin A1c 05/25/2016  . BMI 34.0-34.9,adult 05/25/2016  . Breast cancer screening 05/12/2015  . Low T4 05/12/2015  . Vitamin D deficiency 05/12/2015  . Prediabetes 05/12/2015  . History of colon polyps 05/27/2014  . Routine general medical examination at a health care facility 05/27/2014  . Vasomotor flushing 03/17/2014  . Personal history of cardiac arrhythmia 03/17/2014    Past Medical History:  Diagnosis Date  . Arthritis   . Cervical disc disease   . Cystocele   . Heart murmur   . History of adenomatous polyp of colon 06/24/15  . History of rheumatic fever    childhood  . 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 surveillance  . Mitral valve prolapse   . Primary osteoarthritis of right hip    Intraarticular steroid injection by Dr. Nelva Bush 01/2016  . Rectocele   . Rectocele   . SA node dysfunction [facility])     Past Surgical History:  Procedure Laterality Date  . ABDOMINAL HYSTERECTOMY  05/1996   TVH--ovaries remain  . arthroscopic knee Right   . BACK SURGERY  2012   disc rupture  . COLONOSCOPY W/ POLYPECTOMY  06/24/15   Recall 5 yrs (Dr. [facility])  . KNEE RECONSTRUCTION Right 2008  . LIVER BIOPSY   1997   fatty tumors    Current Outpatient Prescriptions  Medication Sig Dispense Refill  . aspirin 81 MG tablet Take 81 mg by mouth daily.    . Calcium-Magnesium-Vitamin D (CALCIUM 1200+D3 PO) Take by mouth. Takes a total of 1200mg  daily    . cholecalciferol (VITAMIN D) 400 units TABS tablet Take 800 Units by mouth.    . escitalopram (LEXAPRO) 10 MG tablet Take 1 tablet (10 mg total) by mouth daily. 90 tablet 3  . levothyroxine (LEVOTHROID) 25 MCG tablet Take 1 tablet (25 mcg total) by mouth daily before breakfast. 90 tablet 3   No current facility-administered medications for this visit.      ALLERGIES: Review of patient's allergies indicates no known allergies.  Family History  Problem Relation Age of Onset  . Alcohol abuse Mother   . CVA Mother     Dec 65  . Heart disease Mother   . Cancer Father     prostate  . Arthritis Father   . Heart murmur Son   . Alcohol abuse Sister   . Heart disease Brother     congenital heart defect--Dec age 29 from MI  . Heart murmur Son   . Heart murmur Son   .  Cancer Maternal Aunt 50    Dec colon CA age 24  . Cancer Cousin 44    Dec colon CA age 39    Social History   Social History  . Marital status: Married    Spouse name: [name]  . Number of children: 3  . Years of education: N/A   Occupational History  . [employer]   Social History Main Topics  . Smoking status: Never Smoker  . Smokeless tobacco: Never Used  . Alcohol use 2.4 oz/week    4 Glasses of wine per week  . Drug use: No  . Sexual activity: No     Comment: Hyst   Other Topics Concern  . Not on file   Social History Narrative   Ms. Lombard lives in [place] with her husband.  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 She is from [place].    ROS:  Pertinent items are noted in HPI.  PHYSICAL EXAMINATION:    BP 118/70 (BP Location: Right Arm, Patient Position: Sitting,  Cuff Size: Large)   Pulse 64   Ht 5' 7.5" (1.715 m)   Wt 233 lb (105.7 kg)   BMI 35.95 kg/m     General appearance: alert, cooperative and appears stated age   Pelvic: External genitalia:  no lesions              Urethra:  normal appearing urethra with no masses, tenderness or lesions              Bartholins and Skenes: normal                 Vagina: normal appearing vagina with normal color and discharge, no lesions              Cervix: no lesions                Bimanual Exam:  Uterus:  normal size, contour, position, consistency, mobility, non-tender              Adnexa: no mass, fullness, tenderness   Pessary #4 ring with support placed.  Patient able to place and remove.  It is comfortable and she states she cannot feel it.      Chaperone was present for exam.  ASSESSMENT  Status post TVH.  First degree vault prolapse and third degree rectocele.  Minimal cystocele.    PLAN  Ring with support #4 pessary to patient.  [employer]. Lot number [id number] Patient instructed in use and care of pessary.  Follow up in 4 weeks.  Patient unable to come sooner.    An After Visit Summary was printed and given to the patient.  __15____ minutes face to face time of which over 50% was spent in counseling.

## 2016-10-11 ENCOUNTER — Encounter: Payer: Self-pay | Admitting: Family Medicine

## 2016-10-11 ENCOUNTER — Ambulatory Visit (INDEPENDENT_AMBULATORY_CARE_PROVIDER_SITE_OTHER): Payer: BC Managed Care – PPO | Admitting: Family Medicine

## 2016-10-11 ENCOUNTER — Other Ambulatory Visit: Payer: Self-pay | Admitting: *Deleted

## 2016-10-11 VITALS — BP 118/77 | HR 53 | Temp 98.5°F | Resp 20 | Wt 231.5 lb

## 2016-10-11 DIAGNOSIS — R42 Dizziness and giddiness: Secondary | ICD-10-CM | POA: Diagnosis not present

## 2016-10-11 DIAGNOSIS — J0100 Acute maxillary sinusitis, unspecified: Secondary | ICD-10-CM | POA: Diagnosis not present

## 2016-10-11 MED ORDER — METHYLPREDNISOLONE ACETATE 80 MG/ML IJ SUSP
80.0000 mg | Freq: Once | INTRAMUSCULAR | Status: AC
  Administered 2016-10-11: 80 mg via INTRAMUSCULAR

## 2016-10-11 MED ORDER — DOXYCYCLINE HYCLATE 100 MG PO TABS
100.0000 mg | ORAL_TABLET | Freq: Two times a day (BID) | ORAL | 0 refills | Status: DC
Start: 2016-10-11 — End: 2016-10-11

## 2016-10-11 MED ORDER — DOXYCYCLINE HYCLATE 100 MG PO TABS: 100.0000 mg | ORAL_TABLET | Freq: Two times a day (BID) | ORAL | 0 refills | Status: DC

## 2016-10-11 NOTE — Progress Notes (Signed)
Cynthia [name] , 05-17-1956, 60 y.o., female MRN: [id number] Patient Care Team    Relationship Specialty Notifications Start End  Ma Hillock, DO PCP - General Family Medicine  07/14/15   Rosana Berger, MD Consulting Physician Gastroenterology  06/29/15   Melina Schools, MD Consulting Physician Orthopedic Surgery  04/13/16     CC: dizziness Subjective: Pt presents for an acute OV with complaints of intermittent dizziness of 10 days duration.  Associated symptoms include bilateral ear pain and facial pressure. She endorses having a fever, nasal congestion and runny nose last week. She has since felt better until dizziness and facial pressure occurred. She is worried because her mother had a stroke at patient's current age. The dizziness she has experienced twice once occurred when bending down and then standing. She states she became off balanced for 2-3 steps, stopped walking and then was able to catch her  Balance. She then experienced a episode while sitting and looking up quickly. On this event she felt the room moved from under her for a few seconds. She reports each event lasted less than 10 seconds. She denies chest pain, confusion, headache, shortness of breath, extremity weakness, difficulty swallowing or speaking.   No Known Allergies Social History  Substance Use Topics  . Smoking status: Never Smoker  . Smokeless tobacco: Never Used  . Alcohol use 2.4 oz/week    4 Glasses of wine per week   Past Medical History:  Diagnosis Date  . Arthritis   . Cervical disc disease   . Cystocele   . Heart murmur   . History of adenomatous polyp of colon 06/24/15  . History of rheumatic fever    childhood  . 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 surveillance  . Mitral valve prolapse   . Primary osteoarthritis of right hip    Intraarticular steroid injection by Dr. Nelva Bush 01/2016  . Rectocele   . Rectocele   . SA node dysfunction [facility])    Past Surgical  History:  Procedure Laterality Date  . ABDOMINAL HYSTERECTOMY  05/1996   TVH--ovaries remain  . arthroscopic knee Right   . BACK SURGERY  2012   disc rupture  . COLONOSCOPY W/ POLYPECTOMY  06/24/15   Recall 5 yrs (Dr[facility])  . KNEE RECONSTRUCTION Right 2008  . LIVER BIOPSY  1997   fatty tumors   Family History  Problem Relation Age of Onset  . Alcohol abuse Mother   . CVA Mother     Dec 65  . Heart disease Mother   . Cancer Father     prostate  . Arthritis Father   . Heart murmur Son   . Alcohol abuse Sister   . Heart disease Brother     congenital heart defect--Dec age 41 from MI  . Heart murmur Son   . Heart murmur Son   . Cancer Maternal Aunt 50    Dec colon CA age 75  . Cancer Cousin 63    Dec colon CA age 6     Medication List       Accurate as of 10/11/16  4:03 PM. Always use your most recent med list.          aspirin 81 MG tablet Take 81 mg by mouth daily.   calcium citrate 950 MG tablet Commonly known as:  CALCITRATE - dosed in mg elemental calcium Take 200 mg of elemental calcium by mouth  daily.   cholecalciferol 400 units Tabs tablet Commonly known as:  VITAMIN D Take 800 Units by mouth.   escitalopram 10 MG tablet Commonly known as:  LEXAPRO Take 1 tablet (10 mg total) by mouth daily.   levothyroxine 25 MCG tablet Commonly known as:  LEVOTHROID Take 1 tablet (25 mcg total) by mouth daily before breakfast.       No results found for this or any previous visit (from the past 24 hour(s)). No results found.   ROS: Negative, with the exception of above mentioned in HPI   Objective:  BP 118/77 (BP Location: Right Arm, Patient Position: Sitting, Cuff Size: Large)   Pulse (!) 53   Temp 98.5 F (36.9 C)   Resp 20   Wt 231 lb 8 oz (105 kg)   SpO2 98%   BMI 35.72 kg/m  Body mass index is 35.72 kg/m. Gen: Afebrile. No acute distress. Nontoxic in appearance, well developed, well nourished. Obese caucasian  female.  HENT: AT. [place]. Bilateral TM visualized left TM with air fluid level. MMM, no oral lesions. Bilateral nares with erythema, swelling and drainage. Throat without erythema or exudates. No cough or hoarseness on exam. TTP bilateral max sinus.  Eyes:Pupils Equal Round Reactive to light, Extraocular movements intact,  Conjunctiva without redness, discharge or icterus. Neck/lymp/endocrine: Supple,no  lymphadenopathy CV: RRR, no edema Chest: CTAB, no wheeze or crackles. Good air movement, normal resp effort.  Abd: Soft. NTND. BS present. Neuro:  Normal gait. PERLA. EOMi. Alert. Oriented x3 Cranial nerves II through XII intact.  Psych: Normal affect, dress and demeanor. Normal speech. Normal thought content and judgment.  Assessment/Plan: Cynthia [name] is a 60 y.o. female present for acute OV for  Acute maxillary sinusitis, recurrence not specified/dizziness - discussed with pt I do not believe this is signs of TIA/stroke with her constellation of symptoms and time line. Will treat as sinus infection. Emergent precautions dicussed.  - doxycyline, rest, hydrate  - methylPREDNISolone acetate (DEPO-MEDROL) injection 80 mg; Inject 1 mL (80 mg total) into the muscle once. - F/U 2 weeks if not improved, sooner if worsening. Discussed emergent signs and symptoms, AVS provided.   electronically signed by:  Howard Pouch, DO  [place]

## 2016-10-11 NOTE — Patient Instructions (Addendum)
I believe all your symptoms are coming from a sinus infection vs inner ear inflammation.  Steroid shot today will help decrease the inflammatory process quicker.  Antibiotic called doxycyline prescribed every 12 hours for 10 days. Do not take with calcium product.  Make certain to rest and get plenty of hydration.   I do not think this is signs of a stroke or TIA with your other symptoms present and your BP is normal today.   Of course monitor for any neurological signs and if you experience seek emergent medical attention.    Transient Ischemic Attack A transient ischemic attack (TIA) is a "warning stroke" that causes stroke-like symptoms. A TIA does not cause lasting damage to the brain. The symptoms of a TIA can happen fast and do not last long. It is important to know the symptoms of a TIA and what to do. This can help prevent stroke or death. Follow these instructions at home:  Take medicines only as told by your doctor. Make sure you understand all of the instructions.  You may need to take aspirin or warfarin medicine. Warfarin needs to be taken exactly as told.  Taking too much or too little warfarin is dangerous. Blood tests must be done as often as told by your doctor. A PT blood test measures how long it takes for blood to clot. Your PT is used to calculate another value called an INR. Your PT and INR help your doctor adjust your warfarin dosage. He or she will make sure you are taking the right amount.  Food can cause problems with warfarin and affect the results of your blood tests. This is true for foods high in vitamin K. Eat the same amount of foods high in vitamin K each day. Foods high in vitamin K include spinach, kale, broccoli, cabbage, collard and turnip greens, Brussels sprouts, peas, cauliflower, seaweed, and parsley. Other foods high in vitamin K include beef and pork liver, green tea, and soybean oil. Eat the same amount of foods high in vitamin K each day. Avoid big  changes in your diet. Tell your doctor before changing your diet. Talk to a food specialist (dietitian) if you have questions.  Many medicines can cause problems with warfarin and affect your PT and INR. Tell your doctor about all medicines you take. This includes vitamins and dietary pills (supplements). Do not take or stop taking any prescribed or over-the-counter medicines unless your doctor tells you to.  Warfarin can cause more bruising or bleeding. Hold pressure over any cuts for longer than normal. Talk to your doctor about other side effects of warfarin.  Avoid sports or activities that may cause injury or bleeding.  Be careful when you shave, floss, or use sharp objects.  Avoid or drink very little alcohol while taking warfarin. Tell your doctor if you change how much alcohol you drink.  Tell your dentist and other doctors that you take warfarin before any procedures.  Follow your diet program as told, if you are given one.  Keep a healthy weight.  Stay active. Try to get at least 30 minutes of activity on all or most days.  Do not use any tobacco products, including cigarettes, chewing tobacco, or electronic cigarettes. If you need help quitting, ask your doctor.  Limit alcohol intake to no more than 1 drink per day for nonpregnant women and 2 drinks per day for men. One drink equals 12 ounces of beer, 5 ounces of wine, or 1 ounces of hard  liquor.  Do not abuse drugs.  Keep your home safe so you do not fall. You can do this by:  Putting grab bars in the bedroom and bathroom.  Raising toilet seats.  Putting a seat in the shower.  Keep all follow-up visits as told by your doctor. This is important. Contact a doctor if:  Your personality changes.  You have trouble swallowing.  You have double vision.  You are dizzy greater than 1 minute  Get help right away if: These symptoms may be an emergency. Do not wait to see if the symptoms will go away. Get medical help  right away. Call your local emergency services (911 in the [place] Do not drive yourself to the hospital.  You have sudden weakness or lose feeling (go numb), especially on one side of the body. This can affect your:  Face.  Arm.  Leg.  You have sudden trouble walking (sustained)  You have sudden trouble moving your arms or legs.  You have sudden confusion.  You have trouble talking.  You have trouble understanding.  You have sudden trouble seeing in one or both eyes.  You lose your balance.  Your movements are not smooth.  You have a sudden, very bad headache with no known cause.  You have new chest pain.  Your heartbeat is unsteady.  You are partly or totally unaware of what is going on around you. This information is not intended to replace advice given to you by your health care provider. Make sure you discuss any questions you have with your health care provider. Document Released: 08/23/2008 Document Revised: 07/18/2016 Document Reviewed: 02/19/2014 Elsevier Interactive Patient Education  2017 [employer].

## 2016-10-28 ENCOUNTER — Ambulatory Visit: Payer: BC Managed Care – PPO | Admitting: Obstetrics and Gynecology

## 2016-11-09 ENCOUNTER — Ambulatory Visit: Payer: BC Managed Care – PPO | Admitting: Obstetrics and Gynecology

## 2016-11-23 ENCOUNTER — Encounter: Payer: Self-pay | Admitting: Obstetrics and Gynecology

## 2016-11-23 ENCOUNTER — Ambulatory Visit (INDEPENDENT_AMBULATORY_CARE_PROVIDER_SITE_OTHER): Payer: BC Managed Care – PPO | Admitting: Obstetrics and Gynecology

## 2016-11-23 VITALS — BP 102/70 | HR 64 | Resp 15 | Wt 230.0 lb

## 2016-11-23 DIAGNOSIS — N816 Rectocele: Secondary | ICD-10-CM | POA: Diagnosis not present

## 2016-11-23 NOTE — Progress Notes (Signed)
GYNECOLOGY  VISIT   HPI: 60 y.o.   Married  Caucasian  female   G3P3 with No LMP recorded. Patient has had a hysterectomy.   here for pessary follow up.  Has prolapse and no urinary incontinence.   Fitted for a #4 ring with support pessary.  Pessary did not stay in well.  Had some pink discoloration with use.  Took it out and tried it again.  She felt she needed to keep pushing it back up.   Does regular splinting in the vagina to have BMs. Not using stool softeners.  Stool is dry.   States that a Gelhorn did not feel comfortable.   Does physical therapy for her back.   Hgb A1C 5.6 in June 2017.   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   GYNECOLOGIC HISTORY: No LMP recorded. Patient has had a hysterectomy. Contraception:  Postmenopause  Menopausal hormone therapy:  None  Last mammogram:  05-20-15 NEG BI RADS 1  Last pap smear:   Years ago         OB History    Gravida Para Term Preterm AB Living   3 3       3    SAB TAB Ectopic Multiple Live Births           3         Patient Active Problem List   Diagnosis Date Noted  . Dizziness 10/11/2016  . Osteoarthritis 05/25/2016  . Elevated hemoglobin A1c 05/25/2016  . BMI 34.0-34.9,adult 05/25/2016  . Breast cancer screening 05/12/2015  . Low T4 05/12/2015  . Vitamin D deficiency 05/12/2015  . Prediabetes 05/12/2015  . History of colon polyps 05/27/2014  . Routine general medical examination at a health care facility 05/27/2014  . Vasomotor flushing 03/17/2014  . Personal history of cardiac arrhythmia 03/17/2014    Past Medical History:  Diagnosis Date  . Arthritis   . Cervical disc disease   . Cystocele   . Heart murmur   . History of adenomatous polyp of colon 06/24/15  . History of rheumatic fever    childhood  . 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 surveillance  . Mitral valve prolapse   . Primary osteoarthritis of right hip    Intraarticular steroid  injection by Dr. Nelva Bush 01/2016  . Rectocele   . Rectocele   . SA node dysfunction [facility])     Past Surgical History:  Procedure Laterality Date  . ABDOMINAL HYSTERECTOMY  05/1996   TVH--ovaries remain  . arthroscopic knee Right   . BACK SURGERY  2012   disc rupture  . COLONOSCOPY W/ POLYPECTOMY  06/24/15   Recall 5 yrs (Dr. [facility])  . KNEE RECONSTRUCTION Right 2008  . LIVER BIOPSY  1997   fatty tumors    Current Outpatient Prescriptions  Medication Sig Dispense Refill  . aspirin 81 MG tablet Take 81 mg by mouth daily.    . calcium citrate (CALCITRATE - DOSED IN MG ELEMENTAL CALCIUM) 950 MG tablet Take 200 mg of elemental calcium by mouth daily.    . cholecalciferol (VITAMIN D) 400 units TABS tablet Take 800 Units by mouth.    . escitalopram (LEXAPRO) 10 MG tablet Take 1 tablet (10 mg total) by mouth daily. 90 tablet 3  . levothyroxine (LEVOTHROID) 25 MCG tablet Take 1 tablet (25 mcg total) by mouth daily before breakfast. 90 tablet 3   No current facility-administered  medications for this visit.      ALLERGIES: Patient has no known allergies.  Family History  Problem Relation Age of Onset  . Alcohol abuse Mother   . CVA Mother     Dec 65  . Heart disease Mother   . Cancer Father     prostate  . Arthritis Father   . Heart murmur Son   . Alcohol abuse Sister   . Heart disease Brother     congenital heart defect--Dec age 18 from MI  . Heart murmur Son   . Heart murmur Son   . Cancer Maternal Aunt 50    Dec colon CA age 95  . Cancer Cousin 88    Dec colon CA age 16    Social History   Social History  . Marital status: Married    Spouse name: [name]  . Number of children: 3  . Years of education: N/A   Occupational History  . [employer]   Social History Main Topics  . Smoking status: Never Smoker  . Smokeless tobacco: Never Used  . Alcohol use 2.4 oz/week    4 Glasses of wine per week  . Drug use: No  .  Sexual activity: No     Comment: Hyst   Other Topics Concern  . Not on file   Social History Narrative   Ms. Sconyers lives in [place] with her husband.  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 She is from [place].    ROS:  Pertinent items are noted in HPI.  PHYSICAL EXAMINATION:    BP 102/70 (BP Location: Right Arm, Patient Position: Sitting, Cuff Size: Normal)   Pulse 64   Resp 15   Wt 230 lb (104.3 kg)   BMI 35.49 kg/m     General appearance: alert, cooperative and appears stated age   Pelvic: External genitalia:  no lesions              Urethra:  normal appearing urethra with no masses, tenderness or lesions              Bartholins and Skenes: normal                 Vagina: normal appearing vagina with normal color and discharge, no lesions.  Third degree rectocele, minimal vault and anterior vaginal wall prolapse.              Cervix: absent.                Bimanual Exam:  Uterus:   Absent.              Adnexa: no mass, fullness, tenderness            Chaperone was present for exam.  ASSESSMENT  Status post TVH.  Minimal vault prolapse and third degree rectocele.  Minimal cystocele.  Difficulty with weight loss.   PLAN  Patient not using pessary.  She is considering surgery for June 2018.  At this juncture, I would consider just a posterior colporrhaphy with possible sacrospinous vaginal vault suspension.  Avoid straining and heavy lifting.  Use stool softeners or Metamucil.  We discussed Weight Watchers.  She will get up to date on her mammogram.    An After Visit Summary was printed and given to the patient.  ___15___ minutes face to face time of which over 50% was spent in counseling.

## 2016-11-30 ENCOUNTER — Encounter: Payer: Self-pay | Admitting: Family Medicine

## 2016-11-30 ENCOUNTER — Ambulatory Visit (INDEPENDENT_AMBULATORY_CARE_PROVIDER_SITE_OTHER): Payer: BC Managed Care – PPO | Admitting: Family Medicine

## 2016-11-30 VITALS — BP 126/80 | HR 75 | Temp 97.5°F | Resp 20 | Wt 229.5 lb

## 2016-11-30 DIAGNOSIS — J0100 Acute maxillary sinusitis, unspecified: Secondary | ICD-10-CM

## 2016-11-30 MED ORDER — DOXYCYCLINE HYCLATE 100 MG PO TABS: 100.0000 mg | ORAL_TABLET | Freq: Two times a day (BID) | ORAL | 0 refills | Status: DC

## 2016-11-30 NOTE — Patient Instructions (Signed)
Mucinex plain or mucinex Dm = mucinex + robitussin , flonase Rest, hydrate Doxycyline every 12 hours for 10 days.     Sinus Headache A sinus headache happens when your sinuses become clogged or swollen. You may feel pain or pressure in your face, forehead, ears, or upper teeth. Sinus headaches can be mild or severe. Follow these instructions at home:  Take medicines only as told by your doctor.  If you were given an antibiotic medicine, finish all of it even if you start to feel better.  Use a nose spray if you feel stuffed up (congested).  If told, apply a warm, moist washcloth to your face to help lessen pain. Contact a doctor if:  You get headaches more than one time each week.  Light or sound bothers you.  You have a fever.  You feel sick to your stomach (nauseous) or you throw up (vomit).  Your headaches do not get better with treatment. Get help right away if:  You have trouble seeing.  You suddenly have very bad pain in your face or head.  You start to twitch or shake (seizure).  You are confused.  You have a stiff neck. This information is not intended to replace advice given to you by your health care provider. Make sure you discuss any questions you have with your health care provider. Document Released: 03/16/2011 Document Revised: 07/10/2016 Document Reviewed: 11/10/2014 Elsevier Interactive Patient Education  2017 [employer].

## 2016-11-30 NOTE — Progress Notes (Signed)
Cynthia [name] , Dec 10, 1955, 61 y.o., female MRN: [id number] Patient Care Team    Relationship Specialty Notifications Start End  Ma Hillock, DO PCP - General Family Medicine  07/14/15   Rosana Berger, MD Consulting Physician Gastroenterology  06/29/15   Melina Schools, MD Consulting Physician Orthopedic Surgery  04/13/16     CC: chest congestion Subjective: Pt presents for an acute OV with complaints of cough of 8 days  duration.  Associated symptoms include chest congestion, nasal congestion, ear pressure, eye pressure, headache. Sore from coughing so hard. She is rattling in her lungs. Pt has tried Nyquil to ease their symptoms.   No Known Allergies Social History  Substance Use Topics  . Smoking status: Never Smoker  . Smokeless tobacco: Never Used  . Alcohol use 2.4 oz/week    4 Glasses of wine per week   Past Medical History:  Diagnosis Date  . Arthritis   . Cervical disc disease   . Cystocele   . Heart murmur   . History of adenomatous polyp of colon 06/24/15  . History of rheumatic fever    childhood  . 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 surveillance  . Mitral valve prolapse   . Primary osteoarthritis of right hip    Intraarticular steroid injection by Dr. Nelva Bush 01/2016  . Rectocele   . Rectocele   . SA node dysfunction [facility])    Past Surgical History:  Procedure Laterality Date  . ABDOMINAL HYSTERECTOMY  05/1996   TVH--ovaries remain  . arthroscopic knee Right   . BACK SURGERY  2012   disc rupture  . COLONOSCOPY W/ POLYPECTOMY  06/24/15   Recall 5 yrs ([facility])  . KNEE RECONSTRUCTION Right 2008  . LIVER BIOPSY  1997   fatty tumors   Family History  Problem Relation Age of Onset  . Alcohol abuse Mother   . CVA Mother     Dec 65  . Heart disease Mother   . Cancer Father     prostate  . Arthritis Father   . Heart murmur Son   . Alcohol abuse Sister   . Heart disease Brother    congenital heart defect--Dec age 27 from MI  . Heart murmur Son   . Heart murmur Son   . Cancer Maternal Aunt 50    Dec colon CA age 42  . Cancer Cousin 50    Dec colon CA age 26   Allergies as of 11/30/2016   No Known Allergies     Medication List       Accurate as of 11/30/16  3:29 PM. Always use your most recent med list.          aspirin 81 MG tablet Take 81 mg by mouth daily.   calcium citrate 950 MG tablet Commonly known as:  CALCITRATE - dosed in mg elemental calcium Take 200 mg of elemental calcium by mouth daily.   cholecalciferol 400 units Tabs tablet Commonly known as:  VITAMIN D Take 800 Units by mouth.   escitalopram 10 MG tablet Commonly known as:  LEXAPRO Take 1 tablet (10 mg total) by mouth daily.   levothyroxine 25 MCG tablet Commonly known as:  LEVOTHROID Take 1 tablet (25 mcg total) by mouth daily before breakfast.       No results found for this or any previous visit (from the past 24 hour(s)). No results found.   ROS: Negative,  with the exception of above mentioned in HPI   Objective:  BP 126/80 (BP Location: Left Arm, Patient Position: Sitting, Cuff Size: Large)   Pulse 75   Temp 97.5 F (36.4 C)   Resp 20   Wt 229 lb 8 oz (104.1 kg)   SpO2 98%   BMI 35.41 kg/m  Body mass index is 35.41 kg/m. Gen: Afebrile. No acute distress. Nontoxic in appearance, well developed, well nourished.  HENT: AT. [place]. Bilateral TM visualized bilateral air flid levels. MMM, no oral lesions. Bilateral nares with erythema and drainage. Throat without erythema or exudates. Cough, TTP max sinus.  Eyes:Pupils Equal Round Reactive to light, Extraocular movements intact,  Conjunctiva without redness, discharge or icterus. Neck/lymp/endocrine: Supple,no  lymphadenopathy CV: RRR Chest: CTAB, no wheeze or crackles. Good air movement, normal resp effort.  Abd: Soft. NTND. BS present.   Assessment/Plan: [name] is a 61 y.o. female present for acute OV  for  Acute maxillary sinusitis, recurrence not specified Mucinex, flonase Rest, hydrate Doxycyline every 12 hours for 10 days.  - F/U PRN  electronically signed by:  Howard Pouch, DO  [place]

## 2016-12-19 ENCOUNTER — Telehealth: Payer: Self-pay | Admitting: *Deleted

## 2016-12-19 NOTE — Telephone Encounter (Signed)
Please call pt: - if her symptoms are not resolved, and she is short of breath at times, then she needs to be seen again to rule out pneumonia, we would need to order a chest xray and examine her to decipher best course of treatment.

## 2016-12-19 NOTE — Telephone Encounter (Signed)
Patient called and states she finished her antibiotics and still has cough and chest congestion and is SHOB at times. She states she has been sick since 12/27 and is requesting additional antibiotics sine the Doxycycline didn't clear it up. Please advise.

## 2016-12-20 ENCOUNTER — Ambulatory Visit (INDEPENDENT_AMBULATORY_CARE_PROVIDER_SITE_OTHER): Payer: BC Managed Care – PPO | Admitting: Family Medicine

## 2016-12-20 ENCOUNTER — Encounter: Payer: Self-pay | Admitting: Family Medicine

## 2016-12-20 VITALS — BP 123/62 | HR 56 | Temp 98.0°F | Resp 20 | Wt 225.0 lb

## 2016-12-20 DIAGNOSIS — J209 Acute bronchitis, unspecified: Secondary | ICD-10-CM | POA: Diagnosis not present

## 2016-12-20 DIAGNOSIS — Z1239 Encounter for other screening for malignant neoplasm of breast: Secondary | ICD-10-CM

## 2016-12-20 DIAGNOSIS — Z1231 Encounter for screening mammogram for malignant neoplasm of breast: Secondary | ICD-10-CM | POA: Diagnosis not present

## 2016-12-20 MED ORDER — PREDNISONE 50 MG PO TABS: ORAL_TABLET | ORAL | 0 refills | Status: DC

## 2016-12-20 MED ORDER — IPRATROPIUM-ALBUTEROL 0.5-2.5 (3) MG/3ML IN SOLN
3.0000 mL | Freq: Once | RESPIRATORY_TRACT | Status: AC
  Administered 2016-12-20: 3 mL via RESPIRATORY_TRACT

## 2016-12-20 MED ORDER — HYDROCODONE-HOMATROPINE 5-1.5 MG/5ML PO SYRP: 5.0000 mL | ORAL_SOLUTION | Freq: Three times a day (TID) | ORAL | 0 refills | Status: DC | PRN

## 2016-12-20 MED ORDER — METHYLPREDNISOLONE ACETATE 80 MG/ML IJ SUSP
80.0000 mg | Freq: Once | INTRAMUSCULAR | Status: AC
  Administered 2016-12-20: 80 mg via INTRAMUSCULAR

## 2016-12-20 NOTE — Patient Instructions (Signed)
Steroid shot today.  Start oral steroid tomorrow.  Use cough medicine if needed, especially at night. This may cause sedation.    Reactive Airway: Causes Bronchospasm means your air passages become narrowed. The narrowing is caused by inflammation and tightening of the muscles in the air tubes (bronchi) in your lungs. This can make it hard to breathe or cause you to wheeze and cough. What are the causes? Possible triggers are:  Animal dander from the skin, hair, or feathers of animals.  Dust mites contained in house dust.  Cockroaches.  Pollen from trees or grass.  Mold.  Cigarette or tobacco smoke.  Air pollutants such as dust, household cleaners, hair sprays, aerosol sprays, paint fumes, strong chemicals, or strong odors.  Cold air or weather changes. Cold air may trigger inflammation. Winds increase molds and pollens in the air.  Strong emotions such as crying or laughing hard.  Stress.  Certain medicines such as aspirin or beta-blockers.  Sulfites in foods and drinks, such as dried fruits and wine.  Infections or inflammatory conditions, such as a flu, cold, virus or inflammation of the nasal membranes (rhinitis).  Gastroesophageal reflux disease (GERD). GERD is a condition where stomach acid backs up into your esophagus.  Exercise or strenuous activity. What are the signs or symptoms?  Wheezing.  Excessive coughing, particularly at night.  Chest tightness.  Shortness of breath. How is this diagnosed? Your health care provider will ask you about your medical history and perform a physical exam. A chest X-ray or blood testing may be performed to look for other causes of your symptoms or other conditions that may have triggered your asthma attack. How is this treated? Treatment is aimed at reducing inflammation and opening up the airways in your lungs. Most asthma attacks are treated with inhaled medicines. These include quick relief or rescue medicines (such as  bronchodilators) and controller medicines (such as inhaled corticosteroids). These medicines are sometimes given through an inhaler or a nebulizer. Systemic steroid medicine taken by mouth or given through an IV tube also can be used to reduce the inflammation when an attack is moderate or severe. Antibiotic medicines are only used if a bacterial infection is present. Follow these instructions at home:  Rest.  Drink plenty of liquids. This helps the mucus to remain thin and be easily coughed up. Only use caffeine in moderation and do not use alcohol until you have recovered from your illness.  Do not smoke. Avoid being exposed to secondhand smoke.  You play a critical role in keeping yourself in good health. Avoid exposure to things that cause you to wheeze or to have breathing problems.  Keep your medicines up-to-date and available. Carefully follow your health care provider's treatment plan.  Take your medicine exactly as prescribed.  When pollen or pollution is bad, keep windows closed and use an air conditioner or go to places with air conditioning.  Asthma requires careful medical care. See your health care provider for a follow-up as advised. If you are more than [redacted] weeks pregnant and you were prescribed any new medicines, let your obstetrician know about the visit and how you are doing. Follow up with your health care provider as directed.  After you have recovered from your asthma attack, make an appointment with your outpatient doctor to talk about ways to reduce the likelihood of future attacks. If you do not have a doctor who manages your asthma, make an appointment with a primary care doctor to discuss your asthma.  Get help right away if:  You are getting worse.  You have trouble breathing. If severe, call your local emergency services (911 in the [place]  You develop chest pain or discomfort.  You are vomiting.  You are not able to keep fluids down.  You are coughing up  yellow, green, brown, or bloody sputum.  You have a fever and your symptoms suddenly get worse.  You have trouble swallowing. This information is not intended to replace advice given to you by your health care provider. Make sure you discuss any questions you have with your health care provider. Document Released: 03/01/2007 Document Revised: 04/27/2016 Document Reviewed: 05/22/2013 Elsevier Interactive Patient Education  2017 [employer].

## 2016-12-20 NOTE — Addendum Note (Signed)
Addended by: Howard Pouch A on: 12/20/2016 06:42 PM   Modules accepted: Orders

## 2016-12-20 NOTE — Telephone Encounter (Signed)
Left message with detailed instructions on patient voice mail. Patient instructed to call back to schedule an appt if symptoms persist.

## 2016-12-20 NOTE — Progress Notes (Addendum)
Cynthia [name] , Nov 15, 1956, 61 y.o., female MRN: [id number] Patient Care Team    Relationship Specialty Notifications Start End  Ma Hillock, DO PCP - General Family Medicine  07/14/15   Rosana Berger, MD Consulting Physician Gastroenterology  06/29/15   Melina Schools, MD Consulting Physician Orthopedic Surgery  04/13/16     CC: cough Subjective: Pt presents for an acute OV with complaints of continued cough since completing doxycyline a few days ago for bronchitis/sinusitis.  She reports improvement in her sinus/ear pressure and pain since completing. She reports her Sinus is clearer and she improved, with the exception of a barking cough. She states (points to sternal notch), the cough starts when she feels a tickle in her throat and she has a barking cough with production of a small amount of thick phlegm. This morning she was having chest tightness and having difficulty taking a deep breath, which scared her. She is no longer feeling the "rattling" in her chest she did initially. She states she recalls a few years ago having a cough that was difficult to get over after a URI. She does not have a asthma or COPD history.   No Known Allergies Social History  Substance Use Topics  . Smoking status: Never Smoker  . Smokeless tobacco: Never Used  . Alcohol use 2.4 oz/week    4 Glasses of wine per week   Past Medical History:  Diagnosis Date  . Arthritis   . Cervical disc disease   . Cystocele   . Heart murmur   . History of adenomatous polyp of colon 06/24/15  . History of rheumatic fever    childhood  . 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 surveillance  . Mitral valve prolapse   . Primary osteoarthritis of right hip    Intraarticular steroid injection by Dr. Nelva Bush 01/2016  . Rectocele   . Rectocele   . SA node dysfunction [facility])    Past Surgical History:  Procedure Laterality Date  . ABDOMINAL HYSTERECTOMY  05/1996   TVH--ovaries remain  .  arthroscopic knee Right   . BACK SURGERY  2012   disc rupture  . COLONOSCOPY W/ POLYPECTOMY  06/24/15   Recall 5 yrs (Dr. [facility])  . KNEE RECONSTRUCTION Right 2008  . LIVER BIOPSY  1997   fatty tumors   Family History  Problem Relation Age of Onset  . Alcohol abuse Mother   . CVA Mother     Dec 65  . Heart disease Mother   . Cancer Father     prostate  . Arthritis Father   . Heart murmur Son   . Alcohol abuse Sister   . Heart disease Brother     congenital heart defect--Dec age 66 from MI  . Heart murmur Son   . Heart murmur Son   . Cancer Maternal Aunt 50    Dec colon CA age 81  . Cancer Cousin 50    Dec colon CA age 36   Allergies as of 12/20/2016   No Known Allergies     Medication List       Accurate as of 12/20/16  4:05 PM. Always use your most recent med list.          aspirin 81 MG tablet Take 81 mg by mouth daily.   calcium citrate 950 MG tablet Commonly known as:  CALCITRATE - dosed in mg elemental calcium Take 200 mg  of elemental calcium by mouth daily.   cholecalciferol 400 units Tabs tablet Commonly known as:  VITAMIN D Take 800 Units by mouth.   dextromethorphan-guaiFENesin 30-600 MG 12hr tablet Commonly known as:  MUCINEX DM Take 1 tablet by mouth 2 (two) times daily.   escitalopram 10 MG tablet Commonly known as:  LEXAPRO Take 1 tablet (10 mg total) by mouth daily.   levothyroxine 25 MCG tablet Commonly known as:  LEVOTHROID Take 1 tablet (25 mcg total) by mouth daily before breakfast.       No results found for this or any previous visit (from the past 24 hour(s)). No results found.   ROS: Negative, with the exception of above mentioned in HPI   Objective:  BP 123/62 (BP Location: Left Arm, Patient Position: Sitting, Cuff Size: Large)   Pulse (!) 56   Temp 98 F (36.7 C)   Resp 20   Wt 225 lb (102.1 kg)   SpO2 99%   BMI 34.72 kg/m  Body mass index is 34.72 kg/m. Gen: Afebrile. No acute  distress. Nontoxic in appearance, well developed, well nourished.  HENT: AT. [place]. Bilateral TM visualized bilateral air fluid levels. MMM, no oral lesions. Bilateral nares mild erythema. Throat without erythema or exudates. Barking cough present. Mild hoarseness.  Eyes:Pupils Equal Round Reactive to light, Extraocular movements intact,  Conjunctiva without redness, discharge or icterus. Neck/lymp/endocrine: Supple,no lymphadenopathy CV: RRR , no edema Chest: CTAB, no wheeze or crackles. Moderate air movement, normal resp effort.   Assessment/Plan: Cynthia [name] is a 61 y.o. female present for follow OV for  Acute bronchitis, unspecified organism - lengthy discussion with pt about RAD. She had a similar reaction 2 years ago after URI. Pt was educated on RAD, and the possibility of her requiring steroids with URI/bronchitis like symptoms in the future to avoid extended symptom course. Pt felt much less chest congestion and was able to breath deeply after DUOneb in [place]. She declined inhaler script for know, but if she does not see improvement in a couple days with steroids alone, I would suggest she call back in and we would prescribe her one to use with illness.  - ipratropium-albuterol (DUONEB) 0.5-2.5 (3) MG/3ML nebulizer solution 3 mL; Take 3 mLs by nebulization once. - methylPREDNISolone acetate (DEPO-MEDROL) injection 80 mg; Inject 1 mL (80 mg total) into the muscle once. - Prednisone burst 5 days.  - F/U prn  * of note mammogram screen order need placed again to Schedule. Completed today.   electronically signed by:  Howard Pouch, DO  [place]

## 2017-01-16 ENCOUNTER — Ambulatory Visit
Admission: RE | Admit: 2017-01-16 | Discharge: 2017-01-16 | Disposition: A | Discharge status: Home / Self Care | Payer: BC Managed Care – PPO | Source: Ambulatory Visit | Attending: Family Medicine | Admitting: Family Medicine

## 2017-01-16 DIAGNOSIS — Z1239 Encounter for other screening for malignant neoplasm of breast: Secondary | ICD-10-CM

## 2017-03-01 ENCOUNTER — Encounter: Payer: Self-pay | Admitting: Obstetrics and Gynecology

## 2017-03-01 ENCOUNTER — Ambulatory Visit (INDEPENDENT_AMBULATORY_CARE_PROVIDER_SITE_OTHER): Payer: BC Managed Care – PPO | Admitting: Obstetrics and Gynecology

## 2017-03-01 VITALS — BP 106/62 | HR 60 | Resp 14 | Wt 212.0 lb

## 2017-03-01 DIAGNOSIS — N993 Prolapse of vaginal vault after hysterectomy: Secondary | ICD-10-CM | POA: Diagnosis not present

## 2017-03-01 DIAGNOSIS — N816 Rectocele: Secondary | ICD-10-CM

## 2017-03-01 DIAGNOSIS — N8110 Cystocele, unspecified: Secondary | ICD-10-CM | POA: Diagnosis not present

## 2017-03-01 DIAGNOSIS — R7309 Other abnormal glucose: Secondary | ICD-10-CM | POA: Diagnosis not present

## 2017-03-01 NOTE — Progress Notes (Signed)
GYNECOLOGY  VISIT   HPI: 61 y.o.   Married  Caucasian  female   G3P3 with No LMP recorded. Patient has had a hysterectomy.   here for 3 month follow up. Considering surgery for this summer.  Wants to avoid surgery around July 7th.   Has rectocele.  Ring with support pessary and gelhorn not comfortable. Difficulty passing stool and decreased urge to have a BM. No fecal soiling.   Saw her digestive specialist due to worsening of hemorrhoids. She may do banding in the future, Dr. Glennon Hamilton in [place].  Is now taking a prescription laxative which is not working well yet.  Feels like she has not muscle power to have a BM.   Some urgency to void.  Voiding every 3 - 4 hours.  Up at night to void once or twice to void.  No leakage.  No leak with cough, laugh, sneeze, run or jump.  2 leaks when bladder was full or coughed in the last 6 months.  No pad use.   Sexually active.   No changes in her medical health since last office visit.  Lost 20 pounds in preparation for surgery and would like to loose 10 more.   HgbA1C 5.03 May 2016.   FH brother with massive MI age 22.  Had aneurysm.   Patient has MVP and hx SA node dysfunction.   PCP - Nicanor Bake, MD  GYNECOLOGIC HISTORY: No LMP recorded. Patient has had a hysterectomy. Contraception:  Hysterectomy Menopausal hormone therapy:  None Last mammogram: 01-16-17 Density B/Neg/BiRads1:TBC Last pap smear: Years ago - normal per patient         OB History    Gravida Para Term Preterm AB Living   3 3       3    SAB TAB Ectopic Multiple Live Births           3         Patient Active Problem List   Diagnosis Date Noted  . Dizziness 10/11/2016  . Osteoarthritis 05/25/2016  . Elevated hemoglobin A1c 05/25/2016  . BMI 34.0-34.9,adult 05/25/2016  . Breast cancer screening 05/12/2015  . Low T4 05/12/2015  . Vitamin D deficiency 05/12/2015  . Prediabetes 05/12/2015  . History of colon polyps 05/27/2014  . Routine general  medical examination at a health care facility 05/27/2014  . Vasomotor flushing 03/17/2014  . Personal history of cardiac arrhythmia 03/17/2014    Past Medical History:  Diagnosis Date  . Arthritis   . Cervical disc disease   . Cystocele   . Heart murmur   . History of adenomatous polyp of colon 06/24/15  . History of rheumatic fever    childhood  . 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 surveillance  . Mitral valve prolapse   . Primary osteoarthritis of right hip    Intraarticular steroid injection by Dr. Nelva Bush 01/2016  . Rectocele   . Rectocele   . SA node dysfunction [facility])     Past Surgical History:  Procedure Laterality Date  . ABDOMINAL HYSTERECTOMY  05/1996   TVH--ovaries remain  . arthroscopic knee Right   . BACK SURGERY  2012   disc rupture  . COLONOSCOPY W/ POLYPECTOMY  06/24/15   Recall 5 yrs (Dr. [facility])  . KNEE RECONSTRUCTION Right 2008  . LIVER BIOPSY  1997   fatty tumors    Current Outpatient Prescriptions  Medication Sig Dispense Refill  . aspirin 81 MG  tablet Take 81 mg by mouth daily.    . calcium citrate (CALCITRATE - DOSED IN MG ELEMENTAL CALCIUM) 950 MG tablet Take 200 mg of elemental calcium by mouth daily.    . cholecalciferol (VITAMIN D) 400 units TABS tablet Take 800 Units by mouth.    . escitalopram (LEXAPRO) 10 MG tablet Take 1 tablet (10 mg total) by mouth daily. 90 tablet 3  . levothyroxine (LEVOTHROID) 25 MCG tablet Take 1 tablet (25 mcg total) by mouth daily before breakfast. 90 tablet 3  . polyethylene glycol powder (GLYCOLAX/MIRALAX) powder Take by mouth as needed.     No current facility-administered medications for this visit.      ALLERGIES: Patient has no known allergies.  Family History  Problem Relation Age of Onset  . Alcohol abuse Mother   . CVA Mother     Dec 65  . Heart disease Mother   . Cancer Father     prostate  . Arthritis Father   . Heart murmur Son   . Alcohol  abuse Sister   . Heart disease Brother     congenital heart defect--Dec age 28 from MI  . Heart murmur Son   . Heart murmur Son   . Cancer Maternal Aunt 50    Dec colon CA age 32  . Cancer Cousin 3    Dec colon CA age 108    Social History   Social History  . Marital status: Married    Spouse name: [name]  . Number of children: 3  . Years of education: N/A   Occupational History  . [employer]   Social History Main Topics  . Smoking status: Never Smoker  . Smokeless tobacco: Never Used  . Alcohol use 2.4 oz/week    4 Glasses of wine per week  . Drug use: No  . Sexual activity: No     Comment: Hyst   Other Topics Concern  . Not on file   Social History Narrative   Ms. Koebel lives in [place] with her husband.  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 She is from [place].    ROS:  Pertinent items are noted in HPI.  PHYSICAL EXAMINATION:    BP 106/62 (BP Location: Right Arm, Patient Position: Sitting, Cuff Size: Normal)   Pulse 60   Resp 14   Wt 212 lb (96.2 kg)   BMI 32.71 kg/m     General appearance: alert, cooperative and appears stated age   Pelvic: External genitalia:  no lesions              Urethra:  normal appearing urethra with no masses, tenderness or lesions              Bartholins and Skenes: normal                 Vagina: normal appearing vagina with normal color and discharge, no lesions.  Third degree rectocele.  First degree cystocele and first degree vaginal vault prolapse.               Cervix: absent.                 Bimanual Exam:  Uterus:  Absent.               Adnexa: no mass, fullness, tenderness  Rectal exam: Yes.  .  Confirms.              Anus:  normal sphincter tone, no lesions  Chaperone was present for exam.  ASSESSMENT  Status post TVH.  Pelvic organ prolapse.  Third degree cystocele, vaginal vault prolapse following  hysterectomy, and thrid degree rectocele. Hx cardiac arrthythmia.  Hx MVP. Hx elevated HgbA1C.   PLAN  We discussed the progression of the patient's prolapse and that I would now recommend proceed forward with a complete prolapse repair including a vaginal vault suspension, anterior and posterior colporrhaphy.  We discussed vaginal, abdominal, and laparoscopic routes for the vaginal vault suspension, and we elected to proceed forward with a sacrocolpopexy for the vaginal vault suspension.  We discussed the use of permanent mesh and potential erosion issues. Her surgery will also include a midurethral sling and cystoscopy in this setting.  We discussed that this also uses a permanent mesh material. We will proceed with BSO.   I have given the patient reading materials and highlighted the proposed procedures in her ACOG handouts.  She will return in 2 - 4 weeks so we can have a more extensive discussion. Check hgb A1C today. I strongly encouraged continued weight loss in preparation for surgery. Will do cardiology clearance.   An After Visit Summary was printed and given to the patient.  ___25___ minutes face to face time of which over 50% was spent in counseling.

## 2017-03-02 LAB — HEMOGLOBIN A1C
Hgb A1c MFr Bld: 5.4 % (ref <5.7)
Mean Plasma Glucose: 108 mg/dL

## 2017-03-06 ENCOUNTER — Other Ambulatory Visit: Payer: Self-pay | Admitting: *Deleted

## 2017-03-06 DIAGNOSIS — I499 Cardiac arrhythmia, unspecified: Secondary | ICD-10-CM

## 2017-03-06 DIAGNOSIS — I341 Nonrheumatic mitral (valve) prolapse: Secondary | ICD-10-CM

## 2017-03-08 ENCOUNTER — Telehealth: Payer: Self-pay | Admitting: Obstetrics and Gynecology

## 2017-03-08 NOTE — Telephone Encounter (Signed)
Left voicemail regarding referral appointment. The information is listed below. Should the patient need to cancel or reschedule this appointment, please advise them to call the office they've been referred to, in order to reschedule.  Appointment: 03/14/17 at 1:20 pm. Patient will need to arrive 15 minutes early for checkin Dr Debara Pickett with [facility] [address] Office Phone [phone]

## 2017-03-08 NOTE — Telephone Encounter (Signed)
Called patient to review benefits for a recommended surgical procedures. Left Voicemail requesting a call back.  CC: Cynthia [name]

## 2017-03-10 NOTE — Telephone Encounter (Signed)
Call patient to discuss benefits for a recommended surgical procedure. Will also need to convey information regarding a cardiology appointment that has been set up for her.Left Voicemail requesting a call.

## 2017-03-14 ENCOUNTER — Ambulatory Visit: Payer: BC Managed Care – PPO | Admitting: Internal Medicine

## 2017-03-14 ENCOUNTER — Ambulatory Visit (INDEPENDENT_AMBULATORY_CARE_PROVIDER_SITE_OTHER): Payer: BC Managed Care – PPO | Admitting: Internal Medicine

## 2017-03-14 ENCOUNTER — Encounter: Payer: Self-pay | Admitting: Internal Medicine

## 2017-03-14 VITALS — BP 114/70 | HR 57 | Ht 68.0 in | Wt 211.0 lb

## 2017-03-14 DIAGNOSIS — Z8679 Personal history of other diseases of the circulatory system: Secondary | ICD-10-CM | POA: Diagnosis not present

## 2017-03-14 DIAGNOSIS — Z01810 Encounter for preprocedural cardiovascular examination: Secondary | ICD-10-CM

## 2017-03-14 DIAGNOSIS — Z8249 Family history of ischemic heart disease and other diseases of the circulatory system: Secondary | ICD-10-CM | POA: Diagnosis not present

## 2017-03-14 DIAGNOSIS — I341 Nonrheumatic mitral (valve) prolapse: Secondary | ICD-10-CM | POA: Diagnosis not present

## 2017-03-14 NOTE — Progress Notes (Signed)
OFFICE CONSULT NOTE  Chief Complaint:  Preoperative cardiac clearance  Primary Care Physician: Howard Pouch, DO  HPI:  Cynthia [name] is a 61 y.o. female who is being seen today for the evaluation of operative clearance at the request of Kuneff, [place], DO. Her past medical history significant for heart murmur and a diagnosis of mitral valve prolapse about 35 years ago. This occurred when she was about 25 and had an echocardiogram since her brother suddenly died of an aneurysm. He was 10 years old. Her mother also had an aneurysm including a brain aneurysm for which she died of intracranial hemorrhage. She reports that she's never had a head MRI herself. There is also history of aneurysm in 2 of her aunts. She denies any chest pain or worsening shortness of breath. She's been exercising 5 days a week and working on weight loss. She's ready lost over 20 pounds with a goal to lose 20-40 more. Blood pressure is well-controlled today 114/70. She's a nonsmoker.   PMHx:  Past Medical History:  Diagnosis Date  . Arthritis   . Cervical disc disease   . Cystocele   . Heart murmur   . History of adenomatous polyp of colon 06/24/15  . History of rheumatic fever    childhood  . 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 surveillance  . Mitral valve prolapse   . Primary osteoarthritis of right hip    Intraarticular steroid injection by Dr. Nelva Bush 01/2016  . Rectocele   . Rectocele   . SA node dysfunction [facility])     Past Surgical History:  Procedure Laterality Date  . ABDOMINAL HYSTERECTOMY  05/1996   TVH--ovaries remain  . arthroscopic knee Right   . BACK SURGERY  2012   disc rupture  . COLONOSCOPY W/ POLYPECTOMY  06/24/15   Recall 5 yrs (Dr[facility])  . KNEE RECONSTRUCTION Right 2008  . LIVER BIOPSY  1997   fatty tumors    FAMHx:  Family History  Problem Relation Age of Onset  . Alcohol abuse Mother   . CVA Mother     Dec 65    . Heart disease Mother   . Cancer Father     prostate  . Arthritis Father   . Heart murmur Son   . Alcohol abuse Sister   . Heart disease Brother     congenital heart defect--Dec age 21 from MI  . Heart murmur Son   . Heart murmur Son   . Cancer Maternal Aunt 50    Dec colon CA age 71  . Cancer Cousin 24    Dec colon CA age 67    SOCHx:   reports that she has never smoked. She has never used smokeless tobacco. She reports that she drinks about 2.4 oz of alcohol per week . She reports that she does not use drugs.  ALLERGIES:  No Known Allergies  ROS: Pertinent items noted in HPI and remainder of comprehensive ROS otherwise negative.  HOME MEDS: Current Outpatient Prescriptions on File Prior to Visit  Medication Sig Dispense Refill  . aspirin 81 MG tablet Take 81 mg by mouth daily.    . calcium citrate (CALCITRATE - DOSED IN MG ELEMENTAL CALCIUM) 950 MG tablet Take 200 mg of elemental calcium by mouth daily.    . cholecalciferol (VITAMIN D) 400 units TABS tablet Take 800 Units by mouth.    . escitalopram (LEXAPRO) 10 MG tablet  Take 1 tablet (10 mg total) by mouth daily. 90 tablet 3  . levothyroxine (LEVOTHROID) 25 MCG tablet Take 1 tablet (25 mcg total) by mouth daily before breakfast. 90 tablet 3  . polyethylene glycol powder (GLYCOLAX/MIRALAX) powder Take by mouth as needed.     No current facility-administered medications on file prior to visit.     LABS/IMAGING: No results found for this or any previous visit (from the past 48 hour(s)). No results found.  WEIGHTS: Wt Readings from Last 3 Encounters:  03/14/17 211 lb (95.7 kg)  03/01/17 212 lb (96.2 kg)  12/20/16 225 lb (102.1 kg)    VITALS: BP 114/70   Pulse (!) 57   Ht 5\' 8"  (1.727 m)   Wt 211 lb (95.7 kg)   BMI 32.08 kg/m   EXAM: General appearance: alert, no distress and mildly obese Neck: no carotid bruit and no JVD Lungs: clear to auscultation bilaterally Heart: regular rate and rhythm, S1, S2  normal, systolic murmur: early systolic 2/6, blowing at apex and mid systolic click present Abdomen: soft, non-tender; bowel sounds normal; no masses,  no organomegaly Extremities: extremities normal, atraumatic, no cyanosis or edema Pulses: 2+ and symmetric Skin: Skin color, texture, turgor normal. No rashes or lesions Neurologic: Grossly normal Psych: Pleasant  EKG: Sinus bradycardia first degree AV block at 57  ASSESSMENT: 1. Low risk for upcoming surgery 2. History of mitral valve prolapse with infrequent arrhythmia 3. Strong family history of thoracoabdominal aneurysm 4. History of fatal cerebral aneurysm in her mother 66. Leg cramping/restlessness - related to varicose veins  PLAN: 1.   [name] seems to be low risk for upcoming surgery. She does have an audible mitral valve murmur with features of prolapse. Given the fact this is been long-standing is reasonable to consider reevaluation with echocardiogram to look for any left atrial enlargement. She will also be screened for ascending thoracic aortic aneurysm with echo to rule out any aneurysm. I've advised an abdominal ultrasound for evaluation of AAA, however she declined due to cost issues. I've also advised an MRI/MRA of the head given the history of fatal cerebral aneurysm in her mother. This was declined at the time as well. She reports some leg cramping and restlessness and is noted to have varicose veins in both legs may have symptomatic varicosities. She could benefit from bilateral knee-high 20-30 mm compression stockings.  I'll contact her with the results of the echo but anticipate she should be able to have surgery without any cardiac issues. I'm happy to see her back for further follow-up or to order additional studies to further risk stratify her.  Thanks for the consultation.  Pixie Casino, MD, [facility] Attending Cardiologist [place] C Hilty 03/14/2017, 5:19 PM

## 2017-03-14 NOTE — Patient Instructions (Signed)
Your physician has requested that you have an echocardiogram @ [address] - [address]rd Floor. Echocardiography is a painless test that uses sound waves to create images of your heart. It provides your doctor with information about the size and shape of your heart and how well your heart's chambers and valves are working. This procedure takes approximately one hour. There are no restrictions for this procedure.  Your physician recommends that you schedule a follow-up appointment as needed.

## 2017-03-15 NOTE — Telephone Encounter (Signed)
Cynthia [name] spoke with patient on 03/10/17 and conveyed benefit information in regards to recommended surgery. According to [facility] the patient has completed her appointment with cardiologist, Dr Debara Pickett, on 03/14/17.  Call to patient to verify how she would like to proceed. Left voicemail requesting a return call.    cc: Lamont Snowball

## 2017-03-15 NOTE — Progress Notes (Signed)
GYNECOLOGY  VISIT   HPI: 61 y.o.   Married  Caucasian  female   G3P3 with No LMP recorded. Patient has had a hysterectomy.   here for follow up and surgical discussion.  Has a rectocele. On recent visit on 03/01/17, patient was noted to have progressive prolapse with a third degree cystocele, first degree vaginal vault prolapse, and a third degree rectocele.  Some urgency to void.  Voiding every 3 - 4 hours.  Up at night to void once or twice to void.  No leakage.  No leak with cough, laugh, sneeze, run or jump.  2 leaks when bladder was full or coughed in the last 6 months.  No pad use.   Had an 11 pound vaginal delivery with a shoulder dystocia.  Had extensive tearing.   Had cardiology visit in preparation for surgery. She will do an ECHO due her her MVP and hx cardiac arrhythmia.  HgbA1C 5.4 on 03/01/17.  Hoping for surgery to be done after July 9th.   GYNECOLOGIC HISTORY: No LMP recorded. Patient has had a hysterectomy. Contraception:  Hysterectomy Menopausal hormone therapy:  none Last mammogram:  01-16-17 Density B/Neg/BiRads1:TBC Last pap smear: Years ago -- normal per patient        OB History    Gravida Para Term Preterm AB Living   3 3       3    SAB TAB Ectopic Multiple Live Births           3         Patient Active Problem List   Diagnosis Date Noted  . MVP (mitral valve prolapse) 03/14/2017  . Family history of aortic aneurysm 03/14/2017  . Dizziness 10/11/2016  . Osteoarthritis 05/25/2016  . Elevated hemoglobin A1c 05/25/2016  . BMI 34.0-34.9,adult 05/25/2016  . Preoperative cardiovascular examination 05/12/2015  . Low T4 05/12/2015  . Vitamin D deficiency 05/12/2015  . Prediabetes 05/12/2015  . History of colon polyps 05/27/2014  . Routine general medical examination at a health care facility 05/27/2014  . Vasomotor flushing 03/17/2014  . Personal history of cardiac arrhythmia 03/17/2014    Past Medical History:  Diagnosis Date  . Arthritis   .  Cervical disc disease   . Cystocele   . Heart murmur   . History of adenomatous polyp of colon 06/24/15  . History of rheumatic fever    childhood  . 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 surveillance  . Mitral valve prolapse   . Primary osteoarthritis of right hip    Intraarticular steroid injection by Dr. Nelva Bush 01/2016  . Rectocele   . Rectocele   . SA node dysfunction [facility])     Past Surgical History:  Procedure Laterality Date  . ABDOMINAL HYSTERECTOMY  05/1996   TVH--ovaries remain  . arthroscopic knee Right   . BACK SURGERY  2012   disc rupture  . COLONOSCOPY W/ POLYPECTOMY  06/24/15   Recall 5 yrs ([facility])  . KNEE RECONSTRUCTION Right 2008  . LIVER BIOPSY  1997   fatty tumors    Current Outpatient Prescriptions  Medication Sig Dispense Refill  . aspirin 81 MG tablet Take 81 mg by mouth daily.    . calcium citrate (CALCITRATE - DOSED IN MG ELEMENTAL CALCIUM) 950 MG tablet Take 200 mg of elemental calcium by mouth daily.    . cholecalciferol (VITAMIN D) 400 units TABS tablet Take 800 Units by mouth.    Marland Kitchen  escitalopram (LEXAPRO) 10 MG tablet Take 1 tablet (10 mg total) by mouth daily. 90 tablet 3  . levothyroxine (LEVOTHROID) 25 MCG tablet Take 1 tablet (25 mcg total) by mouth daily before breakfast. 90 tablet 3  . polyethylene glycol powder (GLYCOLAX/MIRALAX) powder Take by mouth as needed.     No current facility-administered medications for this visit.      ALLERGIES: Patient has no known allergies.  Family History  Problem Relation Age of Onset  . Alcohol abuse Mother   . CVA Mother     Dec 65  . Heart disease Mother   . Cancer Father     prostate  . Arthritis Father   . Heart murmur Son   . Alcohol abuse Sister   . Heart disease Brother     congenital heart defect--Dec age 76 from MI  . Heart murmur Son   . Heart murmur Son   . Cancer Maternal Aunt 50    Dec colon CA age 11  . Cancer Cousin 79     Dec colon CA age 74    Social History   Social History  . Marital status: Married    Spouse name: [name]  . Number of children: 3  . Years of education: N/A   Occupational History  . [employer]   Social History Main Topics  . Smoking status: Never Smoker  . Smokeless tobacco: Never Used  . Alcohol use 2.4 oz/week    4 Glasses of wine per week  . Drug use: No  . Sexual activity: No     Comment: Hyst   Other Topics Concern  . Not on file   Social History Narrative   Ms. Stammen lives in [place] with her husband.  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 She is from [place].    ROS:  Pertinent items are noted in HPI.  PHYSICAL EXAMINATION:    BP 118/68 (BP Location: Right Arm, Patient Position: Sitting, Cuff Size: Normal)   Pulse (!) 50   Ht 5' 7.5" (1.715 m)   Wt 212 lb (96.2 kg)   BMI 32.71 kg/m     General appearance: alert, cooperative and appears stated age   ASSESSMENT  Status post TVH.  Pelvic organ prolapse.  Third degree cystocele, vaginal vault prolapse following hysterectomy, and thrid degree rectocele. Hx cardiac arrthythmia.  Hx MVP.   PLAN  Comprehensive discussion of prolapse repair using 3D model.   Plan is for an abdominal sacrocolpopexy with possible Halban's culdoplasty, anterior and posterior colporrhaphy, TVT Exact midurethral sling and cystoscopy and bilateral salpingectomy.  She is still considering bilateral oophorectomy.   She declines a laparoscopic robotic approach and a complete vaginal approach to her surgery.  We discussed benefits and risks of surgery which include but are not limited to life threatening bleeding, infection, damage to surrounding organs, ureteral damage, vaginal pain with intercourse, permanent mesh use which may cause erosion and exposure in the vagina, urethra, bladder or ureters, dyspareunia, slower voiding and urinary  retention, possible need for prolonged catheterization and/or self catheterization, de novo overactive bladder symptoms, reoperation, recurrence of prolapse and incontinence,  DVT, PE, death, and reaction to anesthesia.    I have discussed surgical expectations regarding the procedures and success rates, outcomes, and recovery.     She will complete her cardiac evaluation and proceed forward with her cardiac ECHO. She will also pursue her  abdominal ultrasound to rule out AAA and do an MRI/MRA to rule out aneurysm of the brain due to her family's medical history.     An After Visit Summary was printed and given to the patient.  __40____ minutes face to face time of which over 50% was spent in counseling.

## 2017-03-15 NOTE — Telephone Encounter (Signed)
Patient returned call. Patient is agreeable to benefit information, as presented to her on 03/10/17. Patient has confirmed she is ready to proceed with scheduling. Advised patient our nurse supervisor will call her to review availability of surgery dates for Dr Quincy Simmonds. Patient is agreeable.   cc: Lamont Snowball

## 2017-03-16 ENCOUNTER — Ambulatory Visit (INDEPENDENT_AMBULATORY_CARE_PROVIDER_SITE_OTHER): Payer: BC Managed Care – PPO | Admitting: Obstetrics and Gynecology

## 2017-03-16 ENCOUNTER — Encounter: Payer: Self-pay | Admitting: Obstetrics and Gynecology

## 2017-03-16 VITALS — BP 118/68 | HR 50 | Ht 67.5 in | Wt 212.0 lb

## 2017-03-16 DIAGNOSIS — N8110 Cystocele, unspecified: Secondary | ICD-10-CM

## 2017-03-16 DIAGNOSIS — N816 Rectocele: Secondary | ICD-10-CM

## 2017-03-16 DIAGNOSIS — N993 Prolapse of vaginal vault after hysterectomy: Secondary | ICD-10-CM | POA: Diagnosis not present

## 2017-03-16 MED ORDER — ESTROGENS, CONJUGATED 0.625 MG/GM VA CREA: TOPICAL_CREAM | VAGINAL | 2 refills | Status: DC

## 2017-03-24 ENCOUNTER — Ambulatory Visit (HOSPITAL_COMMUNITY): Payer: BC Managed Care – PPO | Attending: Cardiology

## 2017-03-24 ENCOUNTER — Other Ambulatory Visit: Payer: Self-pay

## 2017-03-24 DIAGNOSIS — I081 Rheumatic disorders of both mitral and tricuspid valves: Secondary | ICD-10-CM | POA: Insufficient documentation

## 2017-03-24 DIAGNOSIS — Z01810 Encounter for preprocedural cardiovascular examination: Secondary | ICD-10-CM | POA: Diagnosis present

## 2017-03-24 DIAGNOSIS — I341 Nonrheumatic mitral (valve) prolapse: Secondary | ICD-10-CM

## 2017-03-24 DIAGNOSIS — Z8249 Family history of ischemic heart disease and other diseases of the circulatory system: Secondary | ICD-10-CM | POA: Diagnosis not present

## 2017-04-03 ENCOUNTER — Other Ambulatory Visit: Payer: Self-pay

## 2017-04-03 DIAGNOSIS — R232 Flushing: Secondary | ICD-10-CM

## 2017-04-03 MED ORDER — LEVOTHYROXINE SODIUM 25 MCG PO TABS: 25.0000 ug | ORAL_TABLET | Freq: Every day | ORAL | 0 refills | Status: DC

## 2017-04-03 MED ORDER — ESCITALOPRAM OXALATE 10 MG PO TABS: 10.0000 mg | ORAL_TABLET | Freq: Every day | ORAL | 0 refills | Status: DC

## 2017-04-03 NOTE — Telephone Encounter (Signed)
Refill request for Escitalopram sent to pharmacy.

## 2017-04-03 NOTE — Telephone Encounter (Signed)
Refill on Levothyroxine sent to pharmacy.

## 2017-04-04 ENCOUNTER — Telehealth: Payer: Self-pay | Admitting: *Deleted

## 2017-04-04 NOTE — Telephone Encounter (Signed)
Surgery scheduled for Tuesday 06-13-17 at 0730 at [facility]. Patient had previously requested surgery be scheduled for this week. Call to patient to confirm and review surgery instructions. Left message to call back.

## 2017-04-04 NOTE — Telephone Encounter (Signed)
See next phone encounter regarding surgery scheduling.  Routing to provider for final review. Patient agreeable to disposition. Will close encounter.

## 2017-04-11 NOTE — Telephone Encounter (Signed)
Call from patient. Confirmed surgery date of 06-13-17 at 0730 at [facility] Quincy Simmonds recommends completing abdominal ultrasound and MRI of brain (due to family history) before proceeding with surgery. Patient agreeable. States she will call cardiology back. Appears to be a misunderstanding, states she did not decline.  Surgery instruction sheet reviewed and printed copy will be mailed. Surgical pre and post op appointments scheduled.  Patient to call back if additional questions or needs assistance with pre-op screening testing.  Routing to provider for final review. Patient agreeable to disposition. Will close encounter.

## 2017-04-12 ENCOUNTER — Telehealth: Payer: Self-pay | Admitting: Internal Medicine

## 2017-04-12 DIAGNOSIS — Z01812 Encounter for preprocedural laboratory examination: Secondary | ICD-10-CM

## 2017-04-12 DIAGNOSIS — Z8249 Family history of ischemic heart disease and other diseases of the circulatory system: Secondary | ICD-10-CM

## 2017-04-12 NOTE — Telephone Encounter (Signed)
Per MD note on 4/17: I've advised an abdominal ultrasound for evaluation of AAA, however she declined due to cost issues. I've also advised an MRI/MRA of the head given the history of fatal cerebral aneurysm in her mother. This was declined at the time as well.   Testing ordered per MD note    LM for patient that test(s) were ordered + BMET - for MRI/MRA Message sent to NL scheduling pool to arrange tests

## 2017-04-12 NOTE — Telephone Encounter (Signed)
New message     She would like you to order : abdominal ultrasound and MRI of the brain

## 2017-04-13 ENCOUNTER — Telehealth: Payer: Self-pay | Admitting: Internal Medicine

## 2017-04-13 NOTE — Telephone Encounter (Signed)
New Message     Do you need the MRI brain with or without contrast  Or Mra head without contrast   Just add new order they have already screen pt

## 2017-04-13 NOTE — Telephone Encounter (Signed)
Called patient and left a VM to call back to schedule AAA duplex.  Faxed order and referral to [facility] to schedule MRA.

## 2017-04-13 NOTE — Telephone Encounter (Signed)
Thanks .Marland Kitchen I will follow-up on those.  Dr. Lemmie Evens

## 2017-04-14 NOTE — Telephone Encounter (Signed)
A MR MRA of head w and wo contrast was ordered. Is this correct or does it need to be a "brain" MRI?

## 2017-04-14 NOTE — Telephone Encounter (Signed)
MRA of head - screening for cerebral aneurysm. Family history of this.  Dr. Lemmie Evens

## 2017-04-17 NOTE — Telephone Encounter (Signed)
LM for Mardene Celeste that MR MRA head is correct order per MD.

## 2017-04-19 ENCOUNTER — Other Ambulatory Visit: Payer: Self-pay | Admitting: Internal Medicine

## 2017-04-19 ENCOUNTER — Other Ambulatory Visit: Payer: Self-pay | Admitting: Cardiovascular Disease

## 2017-04-22 ENCOUNTER — Ambulatory Visit
Admission: RE | Admit: 2017-04-22 | Discharge: 2017-04-22 | Disposition: A | Discharge status: Home / Self Care | Payer: BC Managed Care – PPO | Source: Ambulatory Visit | Attending: Internal Medicine | Admitting: Internal Medicine

## 2017-04-22 ENCOUNTER — Other Ambulatory Visit: Payer: Self-pay | Admitting: Internal Medicine

## 2017-04-22 DIAGNOSIS — Z8249 Family history of ischemic heart disease and other diseases of the circulatory system: Secondary | ICD-10-CM

## 2017-05-02 ENCOUNTER — Other Ambulatory Visit: Payer: Self-pay | Admitting: *Deleted

## 2017-05-02 ENCOUNTER — Other Ambulatory Visit: Payer: Self-pay | Admitting: Internal Medicine

## 2017-05-02 DIAGNOSIS — Z8249 Family history of ischemic heart disease and other diseases of the circulatory system: Secondary | ICD-10-CM

## 2017-05-02 DIAGNOSIS — I739 Peripheral vascular disease, unspecified: Secondary | ICD-10-CM

## 2017-05-02 MED ORDER — LEVOTHYROXINE SODIUM 25 MCG PO TABS: 25.0000 ug | ORAL_TABLET | Freq: Every day | ORAL | 0 refills | Status: DC

## 2017-05-11 ENCOUNTER — Ambulatory Visit (HOSPITAL_COMMUNITY)
Admission: RE | Admit: 2017-05-11 | Discharge: 2017-05-11 | Disposition: A | Discharge status: Home / Self Care | Payer: BC Managed Care – PPO | Source: Ambulatory Visit | Attending: Cardiology | Admitting: Cardiology

## 2017-05-11 DIAGNOSIS — I700 Atherosclerosis of aorta: Secondary | ICD-10-CM | POA: Diagnosis not present

## 2017-05-11 DIAGNOSIS — Z8249 Family history of ischemic heart disease and other diseases of the circulatory system: Secondary | ICD-10-CM

## 2017-05-11 DIAGNOSIS — I739 Peripheral vascular disease, unspecified: Secondary | ICD-10-CM | POA: Diagnosis present

## 2017-05-24 ENCOUNTER — Ambulatory Visit (INDEPENDENT_AMBULATORY_CARE_PROVIDER_SITE_OTHER): Payer: BC Managed Care – PPO | Admitting: Obstetrics and Gynecology

## 2017-05-24 ENCOUNTER — Encounter: Payer: Self-pay | Admitting: Obstetrics and Gynecology

## 2017-05-24 VITALS — BP 108/60 | HR 64 | Resp 16 | Wt 202.0 lb

## 2017-05-24 DIAGNOSIS — N816 Rectocele: Secondary | ICD-10-CM

## 2017-05-24 DIAGNOSIS — N8110 Cystocele, unspecified: Secondary | ICD-10-CM

## 2017-05-24 DIAGNOSIS — N993 Prolapse of vaginal vault after hysterectomy: Secondary | ICD-10-CM | POA: Diagnosis not present

## 2017-05-24 DIAGNOSIS — E039 Hypothyroidism, unspecified: Secondary | ICD-10-CM

## 2017-05-24 NOTE — Patient Instructions (Signed)
I will see you for surgery! 

## 2017-05-24 NOTE — Progress Notes (Signed)
GYNECOLOGY  VISIT   HPI: 61 y.o.   Married  Caucasian  female   G3P3 with No LMP recorded. Patient has had a hysterectomy.   here for  Pre-op visit.  Has prolapse of the vagina.  Presented initially as rectocele. On recent visit on 03/01/17, patient was noted to have progressive prolapse with a third degree cystocele, first degree vaginal vault prolapse, and a third degree rectocele.  Not really sexually active.  Husband is nervous about the prolapse.  Difficulty to have BMs.  No fecal soiling.   No specific leak of urine with stress maneuvers.   Lost 10 pounds since last office visit on 03/16/17.  Doing Weight Watchers.   Hx MVP and did ECHO.   Had complete work up done.  Normal systolic function  and mild diastolic dysfunction per cardiology.  Also had screening testing for aneurysms due to family history.  Abdominal ultrasound - normal aorta.  MRI of brain with contrast - unremarkable other tan normal varian anatomy.  No aneurysm seen.   TSH 2.48 on 05/25/16. Patient is on Synthroid.   Has a hx of previously elevated HgbA1C.  Level was 5.4 on 03/01/17.   Having right knee issues.  Will plan to have a right knee replacement.   Saw her gastroenterologist.  Having hemorrhoids.  Has an anal fissure.  Using a cream which is helping.   GYNECOLOGIC HISTORY: No LMP recorded. Patient has had a hysterectomy. Contraception:  Hysterectomy Menopausal hormone therapy:  Premarin Last mammogram:  01-16-17 Density B/Neg/BiRads1:TBC Last pap smear:   Years ago -- normal per patient        OB History    Gravida Para Term Preterm AB Living   3 3       3    SAB TAB Ectopic Multiple Live Births           3         Patient Active Problem List   Diagnosis Date Noted  . MVP (mitral valve prolapse) 03/14/2017  . Family history of aortic aneurysm 03/14/2017  . Dizziness 10/11/2016  . Osteoarthritis 05/25/2016  . Elevated hemoglobin A1c 05/25/2016  . BMI 34.0-34.9,adult 05/25/2016   . Preoperative cardiovascular examination 05/12/2015  . Low T4 05/12/2015  . Vitamin D deficiency 05/12/2015  . Prediabetes 05/12/2015  . History of colon polyps 05/27/2014  . Routine general medical examination at a health care facility 05/27/2014  . Vasomotor flushing 03/17/2014  . Personal history of cardiac arrhythmia 03/17/2014    Past Medical History:  Diagnosis Date  . Arthritis   . Cervical disc disease   . Cystocele   . Heart murmur   . History of adenomatous polyp of colon 06/24/15  . History of rheumatic fever    childhood  . 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 surveillance  . Mitral valve prolapse   . Primary osteoarthritis of right hip    Intraarticular steroid injection by Dr. Nelva [name] 01/2016  . Rectocele   . Rectocele   . SA node dysfunction [facility])     Past Surgical History:  Procedure Laterality Date  . ABDOMINAL HYSTERECTOMY  05/1996   TVH--ovaries remain  . arthroscopic knee Right   . BACK SURGERY  2012   disc rupture  . COLONOSCOPY W/ POLYPECTOMY  06/24/15   Recall 5 yrs ([name])  . KNEE RECONSTRUCTION Right 2008  . LIVER BIOPSY  1997   fatty tumors  Current Outpatient Prescriptions  Medication Sig Dispense Refill  . aspirin 81 MG tablet Take 81 mg by mouth daily.    . Calcium Citrate-Vitamin D (CALCIUM CITRATE + PO) Take 800 mg by mouth daily.    . cholecalciferol (VITAMIN D) 400 units TABS tablet Take 800 Units by mouth.    . conjugated estrogens (PREMARIN) vaginal cream Use 1/2 g vaginally every night at bed time for the first 2 weeks, then use 1/2 g vaginally two times per week. 30 g 2  . escitalopram (LEXAPRO) 10 MG tablet Take 1 tablet (10 mg total) by mouth daily. 90 tablet 0  . levothyroxine (LEVOTHROID) 25 MCG tablet Take 1 tablet (25 mcg total) by mouth daily before breakfast. 90 tablet 0  . NON FORMULARY 0.3% topical cardizem mix 180mg  #9 gelcaps with 30gm Aquafor and 56mL 2%  viscous lidocaine Garden pea size drop transanal BID    . polyethylene glycol powder (GLYCOLAX/MIRALAX) powder Take by mouth as needed.     No current facility-administered medications for this visit.      ALLERGIES: Patient has no known allergies.  Family History  Problem Relation Age of Onset  . Alcohol abuse Mother   . CVA Mother        Dec 65  . Heart disease Mother   . Cancer Father        prostate  . Arthritis Father   . Heart murmur Son   . Alcohol abuse Sister   . Heart disease Brother        congenital heart defect--Dec age 46 from MI  . Heart murmur Son   . Heart murmur Son   . Cancer Maternal Aunt 50       Dec colon CA age 37  . Cancer Cousin 90       Dec colon CA age 63    Social History   Social History  . Marital status: Married    Spouse name: Cynthia [name]  . Number of children: 3  . Years of education: N/A   Occupational History  . [employer]   Social History Main Topics  . Smoking status: Never Smoker  . Smokeless tobacco: Never Used  . Alcohol use 2.4 oz/week    4 Glasses of wine per week  . Drug use: No  . Sexual activity: No     Comment: Hyst   Other Topics Concern  . Not on file   Social History Narrative   [name] lives in [place] with her husband.  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 She is from [place].    ROS:  Pertinent items are noted in HPI.  PHYSICAL EXAMINATION:    BP 108/60 (BP Location: Right Arm, Patient Position: Sitting, Cuff Size: Normal)   Pulse 64   Resp 16   Wt 202 lb (91.6 kg)   BMI 31.17 kg/m     General appearance: alert, cooperative and appears stated age Head: Normocephalic, without obvious abnormality, atraumatic Neck: no adenopathy, supple, symmetrical, trachea midline and thyroid normal to inspection and palpation Lungs: clear to auscultation bilaterally Heart: regular rate and rhythm Abdomen: soft,  non-tender, no masses,  no organomegaly Extremities: extremities normal, atraumatic, no cyanosis or edema Skin: Skin color, texture, turgor normal. No rashes or lesions Lymph nodes: Cervical, supraclavicular, and axillary nodes normal. No abnormal inguinal nodes palpated Neurologic: Grossly normal  Pelvic: External genitalia:  no lesions  Urethra:  normal appearing urethra with no masses, tenderness or lesions              Bartholins and Skenes: normal                 Vagina: normal appearing vagina with normal color and discharge, no lesions.  Second degree cystocele, first degree vault prolapse, third degree rectocele.               Cervix:  Absent.  Bimanual Exam:  Uterus:   Absent.              Adnexa: no mass, fullness, tenderness              Rectal exam: Yes.  .  Confirms.              Anus:  normal sphincter tone, no lesions  Chaperone was present for exam.  ASSESSMENT  Status post TVH.  Pelvic organ prolapse.  Third degree cystocele, first degree vaginal vault prolapse, third degree rectocele.  Hypothyroidism.   PLAN  Plan is for an abdominal sacrocolpopexy with possible Halban's culdoplasty, anterior and posterior colporrhaphy, TVT Exact midurethral sling and cystoscopy and bilateral salpingectomy.  She is hoping to maintain her ovaries as long as they are normal.  Risks, benefits, and alternatives are discussed with the patient who wishes to proceed.  Surgical expectations and recovery are discussed.  Patient wishes to proceed. TFTs today.  Plan for Lovenox for DVT prophylaxis.  Enema the night before surgery.    An After Visit Summary was printed and given to the patient.  __25____ minutes face to face time of which over 50% was spent in counseling.

## 2017-05-25 LAB — THYROID PANEL WITH TSH
Free Thyroxine Index: 1.6 (ref 1.2–4.9)
T3 UPTAKE RATIO: 25 % (ref 24–39)
T4 TOTAL: 6.2 ug/dL (ref 4.5–12.0)
TSH: 2.83 u[IU]/mL (ref 0.450–4.500)

## 2017-06-02 NOTE — Patient Instructions (Signed)
Your procedure is scheduled on:  Tuesday, June 13, 2017  Enter through the Main Entrance of [facility] at:  6:00 AM  Pick up the phone at the desk and dial [phone].  Call this number if you have problems the morning of surgery: [phone].  Remember: Do NOT eat food or drink after:  Midnight Monday  Take these medicines the morning of surgery with a SIP OF WATER:  Levothyroxine  Stop ALL herbal medications at this time  Do NOT smoke the day of surgery.  Do NOT wear jewelry (body piercing), metal hair clips/bobby pins, make-up, artifical eyelashes or nail polish. Do NOT wear lotions, powders, or perfumes.  You may wear deodorant. Do NOT shave for 48 hours prior to surgery. Do NOT bring valuables to the hospital. Contacts, dentures, or bridgework may not be worn into surgery.  Leave suitcase in car.  After surgery it may be brought to your room.  For patients admitted to the hospital, checkout time is 11:00 AM the day of discharge.  Bring a copy of your healthcare power of attorney and living will documents.

## 2017-06-05 ENCOUNTER — Encounter (HOSPITAL_COMMUNITY)
Admission: RE | Admit: 2017-06-05 | Discharge: 2017-06-05 | Disposition: A | Discharge status: Home / Self Care | Payer: BC Managed Care – PPO | Source: Ambulatory Visit | Attending: Obstetrics and Gynecology | Admitting: Obstetrics and Gynecology

## 2017-06-05 ENCOUNTER — Encounter (HOSPITAL_COMMUNITY): Payer: Self-pay

## 2017-06-05 DIAGNOSIS — Z9889 Other specified postprocedural states: Secondary | ICD-10-CM | POA: Insufficient documentation

## 2017-06-05 DIAGNOSIS — Z90710 Acquired absence of both cervix and uterus: Secondary | ICD-10-CM | POA: Diagnosis not present

## 2017-06-05 DIAGNOSIS — Z7982 Long term (current) use of aspirin: Secondary | ICD-10-CM | POA: Diagnosis not present

## 2017-06-05 DIAGNOSIS — Z01812 Encounter for preprocedural laboratory examination: Secondary | ICD-10-CM | POA: Insufficient documentation

## 2017-06-05 DIAGNOSIS — N813 Complete uterovaginal prolapse: Secondary | ICD-10-CM | POA: Insufficient documentation

## 2017-06-05 DIAGNOSIS — N819 Female genital prolapse, unspecified: Secondary | ICD-10-CM | POA: Diagnosis not present

## 2017-06-05 DIAGNOSIS — Z79899 Other long term (current) drug therapy: Secondary | ICD-10-CM | POA: Diagnosis not present

## 2017-06-05 HISTORY — DX: Other specified postprocedural states: R11.2

## 2017-06-05 HISTORY — DX: Other specified postprocedural states: Z98.890

## 2017-06-05 HISTORY — DX: Hypothyroidism, unspecified: E03.9

## 2017-06-05 HISTORY — DX: Malignant (primary) neoplasm, unspecified: C80.1

## 2017-06-05 LAB — BASIC METABOLIC PANEL
ANION GAP: 8 (ref 5–15)
BUN: 15 mg/dL (ref 6–20)
CALCIUM: 9.2 mg/dL (ref 8.9–10.3)
CO2: 24 mmol/L (ref 22–32)
Chloride: 106 mmol/L (ref 101–111)
Creatinine, Ser: 0.68 mg/dL (ref 0.44–1.00)
GFR calc Af Amer: >60 mL/min (ref >60)
GFR calc non Af Amer: >60 mL/min (ref >60)
GLUCOSE: 95 mg/dL (ref 65–99)
Potassium: 4.1 mmol/L (ref 3.5–5.1)
Sodium: 138 mmol/L (ref 135–145)

## 2017-06-05 LAB — CBC
HCT: 38.3 % (ref 36.0–46.0)
HEMOGLOBIN: 12.6 g/dL (ref 12.0–15.0)
MCH: 29.9 pg (ref 26.0–34.0)
MCHC: 32.9 g/dL (ref 30.0–36.0)
MCV: 91 fL (ref 78.0–100.0)
PLATELETS: 257 10*3/uL (ref 150–400)
RBC: 4.21 MIL/uL (ref 3.87–5.11)
RDW: 14.1 % (ref 11.5–15.5)
WBC: 6.8 10*3/uL (ref 4.0–10.5)

## 2017-06-12 MED ORDER — ENOXAPARIN SODIUM 40 MG/0.4ML ~~LOC~~ SOLN
40.0000 mg | SUBCUTANEOUS | Status: AC
  Administered 2017-06-13: 40 mg via SUBCUTANEOUS
  Filled 2017-06-12: qty 0.4

## 2017-06-12 NOTE — H&P (Signed)
Office Visit   05/24/2017 [place] [name], Cynthia All, MD  Obstetrics and Gynecology   Vaginal vault prolapse after hysterectomy +3 more  Dx   Office Visit ; Referred by Ma Hillock, DO  Reason for Visit   Additional Documentation   Vitals:   BP 108/60 (BP Location: Right Arm, Patient Position: Sitting, Cuff Size: Normal)   Pulse 64   Resp 16   Wt 202 lb (91.6 kg)   BMI 31.17 kg/m   BSA 2.09 m   Flowsheets:   Infectious Disease Screening,   Custom Formula Data,   MEWS Score,   Anthropometrics     Encounter Info:   Billing Info,   History,   Allergies,   Detailed Report     All Notes   Progress Notes by Nunzio Cobbs, MD at 05/24/2017 2:30 PM   Author: Nunzio Cobbs, MD Author Type: Physician Filed: 05/26/2017 5:39 AM  Note Status: Signed Cosign: Cosign Not Required Encounter Date: 05/24/2017  Editor: Nunzio Cobbs, MD (Physician)  Prior Versions: 1. Archie Balboa CMA (Certified Psychologist, sport and exercise) at 05/24/2017 2:30 PM - Sign at close encounter    GYNECOLOGY  VISIT   HPI: 61 y.o.   Married  Caucasian  female   G3P3 with No LMP recorded. Patient has had a hysterectomy.   here for  Pre-op visit.  Has prolapse of the vagina.  Presented initially as rectocele. On recent visit on 03/01/17, patient was noted to have progressive prolapse with a third degree cystocele, first degree vaginal vault prolapse, and a third degree rectocele.  Not really sexually active.  Husband is nervous about the prolapse.  Difficulty to have BMs.  No fecal soiling.   No specific leak of urine with stress maneuvers.   Lost 10 pounds since last office visit on 03/16/17.  Doing Weight Watchers.   Hx MVP and did ECHO.   Had complete work up done.  Normal systolic function  and mild diastolic dysfunction per cardiology.  Also had screening testing for aneurysms due to family history.  Abdominal ultrasound -  normal aorta.  MRI of brain with contrast - unremarkable other tan normal varian anatomy.  No aneurysm seen.   TSH 2.48 on 05/25/16. Patient is on Synthroid.   Has a hx of previously elevated HgbA1C.  Level was 5.4 on 03/01/17.   Having right knee issues.  Will plan to have a right knee replacement.   Saw her gastroenterologist.  Having hemorrhoids.  Has an anal fissure.  Using a cream which is helping.   GYNECOLOGIC HISTORY: No LMP recorded. Patient has had a hysterectomy. Contraception:  Hysterectomy Menopausal hormone therapy:  Premarin Last mammogram:  01-16-17 Density B/Neg/BiRads1:TBC Last pap smear:   Years ago -- normal per patient                OB History    Gravida Para Term Preterm AB Living   3 3       3    SAB TAB Ectopic Multiple Live Births           3             Patient Active Problem List   Diagnosis Date Noted  . MVP (mitral valve prolapse) 03/14/2017  . Family history of aortic aneurysm 03/14/2017  . Dizziness 10/11/2016  . Osteoarthritis 05/25/2016  . Elevated hemoglobin A1c 05/25/2016  . BMI 34.0-34.9,adult 05/25/2016  .  Preoperative cardiovascular examination 05/12/2015  . Low T4 05/12/2015  . Vitamin D deficiency 05/12/2015  . Prediabetes 05/12/2015  . History of colon polyps 05/27/2014  . Routine general medical examination at a health care facility 05/27/2014  . Vasomotor flushing 03/17/2014  . Personal history of cardiac arrhythmia 03/17/2014        Past Medical History:  Diagnosis Date  . Arthritis   . Cervical disc disease   . Cystocele   . Heart murmur   . History of adenomatous polyp of colon 06/24/15  . History of rheumatic fever    childhood  . 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 surveillance  . Mitral valve prolapse   . Primary osteoarthritis of right hip    Intraarticular steroid injection by Dr. Nelva Bush 01/2016  . Rectocele   . Rectocele   . SA node dysfunction [facility])           Past Surgical History:  Procedure Laterality Date  . ABDOMINAL HYSTERECTOMY  05/1996   TVH--ovaries remain  . arthroscopic knee Right   . BACK SURGERY  2012   disc rupture  . COLONOSCOPY W/ POLYPECTOMY  06/24/15   Recall 5 yrs (Dr. [facility])  . KNEE RECONSTRUCTION Right 2008  . LIVER BIOPSY  1997   fatty tumors          Current Outpatient Prescriptions  Medication Sig Dispense Refill  . aspirin 81 MG tablet Take 81 mg by mouth daily.    . Calcium Citrate-Vitamin D (CALCIUM CITRATE + PO) Take 800 mg by mouth daily.    . cholecalciferol (VITAMIN D) 400 units TABS tablet Take 800 Units by mouth.    . conjugated estrogens (PREMARIN) vaginal cream Use 1/2 g vaginally every night at bed time for the first 2 weeks, then use 1/2 g vaginally two times per week. 30 g 2  . escitalopram (LEXAPRO) 10 MG tablet Take 1 tablet (10 mg total) by mouth daily. 90 tablet 0  . levothyroxine (LEVOTHROID) 25 MCG tablet Take 1 tablet (25 mcg total) by mouth daily before breakfast. 90 tablet 0  . NON FORMULARY 0.3% topical cardizem mix 180mg  #9 gelcaps with 30gm Aquafor and 38mL 2% viscous lidocaine Garden pea size drop transanal BID    . polyethylene glycol powder (GLYCOLAX/MIRALAX) powder Take by mouth as needed.     No current facility-administered medications for this visit.      ALLERGIES: Patient has no known allergies.       Family History  Problem Relation Age of Onset  . Alcohol abuse Mother   . CVA Mother        Dec 65  . Heart disease Mother   . Cancer Father        prostate  . Arthritis Father   . Heart murmur Son   . Alcohol abuse Sister   . Heart disease Brother        congenital heart defect--Dec age 42 from MI  . Heart murmur Son   . Heart murmur Son   . Cancer Maternal Aunt 50       Dec colon CA age 23  . Cancer Cousin 73       Dec colon CA age 83    Social History        Social History    . Marital status: Married    Spouse name: [name]  . Number of children: 3  . Years of education: N/A  Occupational History  . [employer]         Social History Main Topics  . Smoking status: Never Smoker  . Smokeless tobacco: Never Used  . Alcohol use 2.4 oz/week    4 Glasses of wine per week  . Drug use: No  . Sexual activity: No     Comment: Hyst       Other Topics Concern  . Not on file      Social History Narrative   Ms. Pantano lives in [place] with her husband.  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 She is from [place].    ROS:  Pertinent items are noted in HPI.  PHYSICAL EXAMINATION:    BP 108/60 (BP Location: Right Arm, Patient Position: Sitting, Cuff Size: Normal)   Pulse 64   Resp 16   Wt 202 lb (91.6 kg)   BMI 31.17 kg/m     General appearance: alert, cooperative and appears stated age Head: Normocephalic, without obvious abnormality, atraumatic Neck: no adenopathy, supple, symmetrical, trachea midline and thyroid normal to inspection and palpation Lungs: clear to auscultation bilaterally Heart: regular rate and rhythm Abdomen: soft, non-tender, no masses,  no organomegaly Extremities: extremities normal, atraumatic, no cyanosis or edema Skin: Skin color, texture, turgor normal. No rashes or lesions Lymph nodes: Cervical, supraclavicular, and axillary nodes normal. No abnormal inguinal nodes palpated Neurologic: Grossly normal  Pelvic: External genitalia:  no lesions              Urethra:  normal appearing urethra with no masses, tenderness or lesions              Bartholins and Skenes: normal                 Vagina: normal appearing vagina with normal color and discharge, no lesions.  Second degree cystocele, first degree vault prolapse, third degree rectocele.               Cervix:  Absent.  Bimanual Exam:  Uterus:   Absent.               Adnexa: no mass, fullness, tenderness              Rectal exam: Yes.  .  Confirms.              Anus:  normal sphincter tone, no lesions  Chaperone was present for exam.  ASSESSMENT  Status post TVH.  Pelvic organ prolapse.  Third degree cystocele, first degree vaginal vault prolapse, third degree rectocele.  Hypothyroidism.   PLAN  Plan is for an abdominal sacrocolpopexy with possible Halban's culdoplasty, anterior and posterior colporrhaphy, TVT Exact midurethral sling and cystoscopy and bilateral salpingectomy.  She is hoping to maintain her ovaries as long as they are normal.  Risks, benefits, and alternatives are discussed with the patient who wishes to proceed.  Surgical expectations and recovery are discussed.  Patient wishes to proceed. TFTs today.  Plan for Lovenox for DVT prophylaxis.  Enema the night before surgery.    An After Visit Summary was printed and given to the patient.  __25____ minutes face to face time of which over 50% was spent in counseling.

## 2017-06-13 ENCOUNTER — Encounter (HOSPITAL_COMMUNITY): Payer: Self-pay

## 2017-06-13 ENCOUNTER — Encounter (HOSPITAL_COMMUNITY)
Admission: RE | Disposition: A | Discharge status: Home / Self Care | Payer: Self-pay | Source: Ambulatory Visit | Attending: Obstetrics and Gynecology

## 2017-06-13 ENCOUNTER — Inpatient Hospital Stay (HOSPITAL_COMMUNITY): Payer: BC Managed Care – PPO | Admitting: Anesthesiology

## 2017-06-13 ENCOUNTER — Inpatient Hospital Stay (HOSPITAL_COMMUNITY)
Admission: RE | Admit: 2017-06-13 | Discharge: 2017-06-15 | DRG: 743 | Disposition: A | Discharge status: Home / Self Care | Payer: BC Managed Care – PPO | Source: Ambulatory Visit | Attending: Obstetrics and Gynecology | Admitting: Obstetrics and Gynecology

## 2017-06-13 DIAGNOSIS — K602 Anal fissure, unspecified: Secondary | ICD-10-CM | POA: Diagnosis present

## 2017-06-13 DIAGNOSIS — K649 Unspecified hemorrhoids: Secondary | ICD-10-CM | POA: Diagnosis present

## 2017-06-13 DIAGNOSIS — N8110 Cystocele, unspecified: Secondary | ICD-10-CM | POA: Diagnosis not present

## 2017-06-13 DIAGNOSIS — Z9889 Other specified postprocedural states: Secondary | ICD-10-CM

## 2017-06-13 DIAGNOSIS — E039 Hypothyroidism, unspecified: Secondary | ICD-10-CM | POA: Diagnosis present

## 2017-06-13 DIAGNOSIS — N393 Stress incontinence (female) (male): Secondary | ICD-10-CM | POA: Diagnosis present

## 2017-06-13 DIAGNOSIS — I341 Nonrheumatic mitral (valve) prolapse: Secondary | ICD-10-CM | POA: Diagnosis present

## 2017-06-13 DIAGNOSIS — N993 Prolapse of vaginal vault after hysterectomy: Secondary | ICD-10-CM | POA: Diagnosis present

## 2017-06-13 DIAGNOSIS — Z7982 Long term (current) use of aspirin: Secondary | ICD-10-CM | POA: Diagnosis not present

## 2017-06-13 DIAGNOSIS — N816 Rectocele: Secondary | ICD-10-CM | POA: Diagnosis not present

## 2017-06-13 HISTORY — PX: ANTERIOR AND POSTERIOR REPAIR: SHX5121

## 2017-06-13 HISTORY — PX: ABDOMINAL SACROCOLPOPEXY: SHX1114

## 2017-06-13 HISTORY — PX: CYSTOSCOPY: SHX5120

## 2017-06-13 HISTORY — PX: BLADDER SUSPENSION: SHX72

## 2017-06-13 HISTORY — PX: BILATERAL SALPINGECTOMY: SHX5743

## 2017-06-13 SURGERY — SACROCOLPOPEXY, ABDOMINAL
Anesthesia: General | Site: Vagina

## 2017-06-13 MED ORDER — SCOPOLAMINE 1 MG/3DAYS TD PT72
MEDICATED_PATCH | TRANSDERMAL | Status: AC
  Administered 2017-06-13: 1.5 mg via TRANSDERMAL
  Filled 2017-06-13: qty 1

## 2017-06-13 MED ORDER — PROPOFOL 10 MG/ML IV BOLUS
INTRAVENOUS | Status: DC | PRN
  Administered 2017-06-13: 100 mg via INTRAVENOUS

## 2017-06-13 MED ORDER — LIDOCAINE HCL (CARDIAC) 20 MG/ML IV SOLN
INTRAVENOUS | Status: AC
  Filled 2017-06-13: qty 5

## 2017-06-13 MED ORDER — ONDANSETRON HCL 4 MG PO TABS: 4.0000 mg | ORAL_TABLET | Freq: Four times a day (QID) | ORAL | Status: DC | PRN

## 2017-06-13 MED ORDER — HYDROMORPHONE HCL 1 MG/ML IJ SOLN
INTRAMUSCULAR | Status: AC
  Filled 2017-06-13: qty 1

## 2017-06-13 MED ORDER — LEVOTHYROXINE SODIUM 25 MCG PO TABS
25.0000 ug | ORAL_TABLET | Freq: Every day | ORAL | Status: DC
  Administered 2017-06-14 – 2017-06-15 (×2): 25 ug via ORAL
  Filled 2017-06-13 (×2): qty 1

## 2017-06-13 MED ORDER — GLYCOPYRROLATE 0.2 MG/ML IJ SOLN
INTRAMUSCULAR | Status: DC | PRN
  Administered 2017-06-13 (×2): 0.1 mg via INTRAVENOUS

## 2017-06-13 MED ORDER — ONDANSETRON HCL 4 MG/2ML IJ SOLN: 4.0000 mg | Freq: Four times a day (QID) | INTRAMUSCULAR | Status: DC | PRN

## 2017-06-13 MED ORDER — DIPHENHYDRAMINE HCL 12.5 MG/5ML PO ELIX: 12.5000 mg | ORAL_SOLUTION | Freq: Four times a day (QID) | ORAL | Status: DC | PRN

## 2017-06-13 MED ORDER — LACTATED RINGERS IV SOLN
INTRAVENOUS | Status: DC
  Administered 2017-06-13: 07:00:00 via INTRAVENOUS

## 2017-06-13 MED ORDER — ONDANSETRON HCL 4 MG/2ML IJ SOLN
INTRAMUSCULAR | Status: DC | PRN
  Administered 2017-06-13: 4 mg via INTRAVENOUS

## 2017-06-13 MED ORDER — DEXAMETHASONE SODIUM PHOSPHATE 4 MG/ML IJ SOLN
INTRAMUSCULAR | Status: AC
  Filled 2017-06-13: qty 1

## 2017-06-13 MED ORDER — LIDOCAINE HCL (CARDIAC) 20 MG/ML IV SOLN
INTRAVENOUS | Status: DC | PRN
  Administered 2017-06-13: 100 mg via INTRAVENOUS

## 2017-06-13 MED ORDER — ESTRADIOL 0.1 MG/GM VA CREA
TOPICAL_CREAM | VAGINAL | Status: AC
  Filled 2017-06-13: qty 42.5

## 2017-06-13 MED ORDER — LIDOCAINE-EPINEPHRINE 1 %-1:100000 IJ SOLN
INTRAMUSCULAR | Status: DC | PRN
Start: 2017-06-13 — End: 2017-06-13
  Administered 2017-06-13 (×2): 4 mL

## 2017-06-13 MED ORDER — SUGAMMADEX SODIUM 200 MG/2ML IV SOLN
INTRAVENOUS | Status: AC
  Filled 2017-06-13: qty 2

## 2017-06-13 MED ORDER — ROCURONIUM BROMIDE 100 MG/10ML IV SOLN
INTRAVENOUS | Status: DC | PRN
  Administered 2017-06-13: 30 mg via INTRAVENOUS
  Administered 2017-06-13: 20 mg via INTRAVENOUS
  Administered 2017-06-13: 10 mg via INTRAVENOUS
  Administered 2017-06-13: 30 mg via INTRAVENOUS
  Administered 2017-06-13 (×2): 10 mg via INTRAVENOUS

## 2017-06-13 MED ORDER — PROPOFOL 10 MG/ML IV BOLUS
INTRAVENOUS | Status: AC
  Filled 2017-06-13: qty 20

## 2017-06-13 MED ORDER — FENTANYL CITRATE (PF) 250 MCG/5ML IJ SOLN
INTRAMUSCULAR | Status: AC
  Filled 2017-06-13: qty 5

## 2017-06-13 MED ORDER — MENTHOL 3 MG MT LOZG: 1.0000 | LOZENGE | OROMUCOSAL | Status: DC | PRN

## 2017-06-13 MED ORDER — LACTATED RINGERS IV SOLN
INTRAVENOUS | Status: DC
  Administered 2017-06-13: 20:00:00 via INTRAVENOUS

## 2017-06-13 MED ORDER — DIPHENHYDRAMINE HCL 50 MG/ML IJ SOLN: 12.5000 mg | Freq: Four times a day (QID) | INTRAMUSCULAR | Status: DC | PRN

## 2017-06-13 MED ORDER — ROCURONIUM BROMIDE 100 MG/10ML IV SOLN
INTRAVENOUS | Status: AC
  Filled 2017-06-13: qty 1

## 2017-06-13 MED ORDER — ESCITALOPRAM OXALATE 10 MG PO TABS
10.0000 mg | ORAL_TABLET | Freq: Every day | ORAL | Status: DC
  Administered 2017-06-13 – 2017-06-14 (×2): 10 mg via ORAL
  Filled 2017-06-13 (×3): qty 1

## 2017-06-13 MED ORDER — KETOROLAC TROMETHAMINE 30 MG/ML IJ SOLN
INTRAMUSCULAR | Status: DC | PRN
  Administered 2017-06-13: 30 mg via INTRAVENOUS

## 2017-06-13 MED ORDER — LACTATED RINGERS IV SOLN
INTRAVENOUS | Status: DC
  Administered 2017-06-13 (×3): via INTRAVENOUS

## 2017-06-13 MED ORDER — CEFOTETAN DISODIUM-DEXTROSE 2-2.08 GM-% IV SOLR
INTRAVENOUS | Status: AC
  Filled 2017-06-13: qty 50

## 2017-06-13 MED ORDER — DEXAMETHASONE SODIUM PHOSPHATE 10 MG/ML IJ SOLN
INTRAMUSCULAR | Status: DC | PRN
  Administered 2017-06-13: 4 mg via INTRAVENOUS

## 2017-06-13 MED ORDER — EPHEDRINE 5 MG/ML INJ
INTRAVENOUS | Status: AC
  Filled 2017-06-13: qty 10

## 2017-06-13 MED ORDER — HYDROMORPHONE HCL 1 MG/ML IJ SOLN
0.2500 mg | INTRAMUSCULAR | Status: DC | PRN
  Administered 2017-06-13 (×2): 0.25 mg via INTRAVENOUS
  Administered 2017-06-13: 0.5 mg via INTRAVENOUS

## 2017-06-13 MED ORDER — MORPHINE SULFATE 2 MG/ML IV SOLN: INTRAVENOUS | Status: DC

## 2017-06-13 MED ORDER — STERILE WATER FOR IRRIGATION IR SOLN
Status: DC | PRN
  Administered 2017-06-13: 1000 mL via INTRAVESICAL

## 2017-06-13 MED ORDER — SUGAMMADEX SODIUM 200 MG/2ML IV SOLN
INTRAVENOUS | Status: DC | PRN
  Administered 2017-06-13: 200 mg via INTRAVENOUS

## 2017-06-13 MED ORDER — NALOXONE HCL 0.4 MG/ML IJ SOLN: 0.4000 mg | INTRAMUSCULAR | Status: DC | PRN

## 2017-06-13 MED ORDER — ESTRADIOL 0.1 MG/GM VA CREA
TOPICAL_CREAM | VAGINAL | Status: DC | PRN
  Administered 2017-06-13: 1 via VAGINAL

## 2017-06-13 MED ORDER — EPHEDRINE SULFATE 50 MG/ML IJ SOLN
INTRAMUSCULAR | Status: DC | PRN
  Administered 2017-06-13 (×4): 5 mg via INTRAVENOUS

## 2017-06-13 MED ORDER — LIDOCAINE-EPINEPHRINE 1 %-1:100000 IJ SOLN
INTRAMUSCULAR | Status: AC
  Filled 2017-06-13: qty 1

## 2017-06-13 MED ORDER — MORPHINE SULFATE 2 MG/ML IV SOLN
INTRAVENOUS | Status: DC
  Administered 2017-06-13 – 2017-06-14 (×4): 1.5 mg via INTRAVENOUS
  Filled 2017-06-13: qty 30

## 2017-06-13 MED ORDER — CEFOTETAN DISODIUM-DEXTROSE 2-2.08 GM-% IV SOLR: 2.0000 g | INTRAVENOUS | Status: DC

## 2017-06-13 MED ORDER — MIDAZOLAM HCL 2 MG/2ML IJ SOLN
INTRAMUSCULAR | Status: DC | PRN
  Administered 2017-06-13: 2 mg via INTRAVENOUS

## 2017-06-13 MED ORDER — KETOROLAC TROMETHAMINE 15 MG/ML IJ SOLN
15.0000 mg | Freq: Four times a day (QID) | INTRAMUSCULAR | Status: AC
  Administered 2017-06-13 – 2017-06-14 (×3): 15 mg via INTRAVENOUS
  Filled 2017-06-13 (×5): qty 1

## 2017-06-13 MED ORDER — OXYCODONE HCL 5 MG PO TABS: 5.0000 mg | ORAL_TABLET | Freq: Once | ORAL | Status: DC | PRN

## 2017-06-13 MED ORDER — ONDANSETRON HCL 4 MG/2ML IJ SOLN
INTRAMUSCULAR | Status: AC
  Filled 2017-06-13: qty 2

## 2017-06-13 MED ORDER — MORPHINE SULFATE 2 MG/ML IV SOLN
INTRAVENOUS | Status: DC
  Administered 2017-06-13: 15:00:00 via INTRAVENOUS
  Filled 2017-06-13: qty 50
  Filled 2017-06-13: qty 30

## 2017-06-13 MED ORDER — SODIUM CHLORIDE 0.9% FLUSH: 9.0000 mL | INTRAVENOUS | Status: DC | PRN

## 2017-06-13 MED ORDER — OXYCODONE-ACETAMINOPHEN 5-325 MG PO TABS
1.0000 | ORAL_TABLET | ORAL | Status: DC | PRN
  Administered 2017-06-14 – 2017-06-15 (×2): 1 via ORAL
  Filled 2017-06-13 (×2): qty 1

## 2017-06-13 MED ORDER — FENTANYL CITRATE (PF) 100 MCG/2ML IJ SOLN
INTRAMUSCULAR | Status: AC
  Filled 2017-06-13: qty 2

## 2017-06-13 MED ORDER — OXYCODONE HCL 5 MG/5ML PO SOLN: 5.0000 mg | Freq: Once | ORAL | Status: DC | PRN

## 2017-06-13 MED ORDER — SCOPOLAMINE 1 MG/3DAYS TD PT72
1.0000 | MEDICATED_PATCH | TRANSDERMAL | Status: DC
  Administered 2017-06-13: 1.5 mg via TRANSDERMAL

## 2017-06-13 MED ORDER — GLYCOPYRROLATE 0.2 MG/ML IJ SOLN
INTRAMUSCULAR | Status: AC
  Filled 2017-06-13: qty 1

## 2017-06-13 MED ORDER — FENTANYL CITRATE (PF) 100 MCG/2ML IJ SOLN
INTRAMUSCULAR | Status: DC | PRN
  Administered 2017-06-13: 100 ug via INTRAVENOUS
  Administered 2017-06-13: 50 ug via INTRAVENOUS
  Administered 2017-06-13: 100 ug via INTRAVENOUS
  Administered 2017-06-13: 50 ug via INTRAVENOUS
  Administered 2017-06-13: 100 ug via INTRAVENOUS
  Administered 2017-06-13 (×2): 50 ug via INTRAVENOUS

## 2017-06-13 MED ORDER — HYDROMORPHONE HCL 1 MG/ML IJ SOLN
INTRAMUSCULAR | Status: DC | PRN
  Administered 2017-06-13: 1 mg via INTRAVENOUS

## 2017-06-13 MED ORDER — MIDAZOLAM HCL 2 MG/2ML IJ SOLN
INTRAMUSCULAR | Status: AC
  Filled 2017-06-13: qty 2

## 2017-06-13 MED ORDER — IBUPROFEN 600 MG PO TABS
600.0000 mg | ORAL_TABLET | Freq: Four times a day (QID) | ORAL | Status: DC | PRN
  Administered 2017-06-14 – 2017-06-15 (×3): 600 mg via ORAL
  Filled 2017-06-13 (×3): qty 1

## 2017-06-13 MED ORDER — KETOROLAC TROMETHAMINE 30 MG/ML IJ SOLN
INTRAMUSCULAR | Status: AC
  Filled 2017-06-13: qty 1

## 2017-06-13 SURGICAL SUPPLY — 75 items
ADH SKN CLS APL DERMABOND .7 (GAUZE/BANDAGES/DRESSINGS) ×4
AGENT HMST KT MTR STRL THRMB (HEMOSTASIS) ×4
BLADE SURG 11 STRL SS (BLADE) ×6 IMPLANT
BLADE SURG 15 STRL LF C SS BP (BLADE) ×4 IMPLANT
BLADE SURG 15 STRL SS (BLADE) ×6
CANISTER SUCT 3000ML PPV (MISCELLANEOUS) ×6 IMPLANT
CATH FOLEY 2WAY SLVR  5CC 18FR (CATHETERS) ×2
CATH FOLEY 2WAY SLVR 5CC 18FR (CATHETERS) ×4 IMPLANT
CLOTH BEACON ORANGE TIMEOUT ST (SAFETY) ×6 IMPLANT
CONT PATH 16OZ SNAP LID 3702 (MISCELLANEOUS) IMPLANT
DECANTER SPIKE VIAL GLASS SM (MISCELLANEOUS) ×2 IMPLANT
DERMABOND ADVANCED (GAUZE/BANDAGES/DRESSINGS) ×2
DERMABOND ADVANCED .7 DNX12 (GAUZE/BANDAGES/DRESSINGS) ×4 IMPLANT
DEVICE CAPIO SLIM SINGLE (INSTRUMENTS) IMPLANT
DISSECTOR SPONGE CHERRY (GAUZE/BANDAGES/DRESSINGS) ×6 IMPLANT
DRAPE UNDERBUTTOCKS STRL (DRAPE) ×6 IMPLANT
DRAPE WARM FLUID 44X44 (DRAPE) ×6 IMPLANT
DRSG OPSITE POSTOP 4X10 (GAUZE/BANDAGES/DRESSINGS) ×6 IMPLANT
DURAPREP 26ML APPLICATOR (WOUND CARE) ×6 IMPLANT
ELECT BLADE 6.5 EXT (BLADE) ×6 IMPLANT
FILTER STRAW FLUID ASPIR (MISCELLANEOUS) IMPLANT
GAUZE PACKING 2X5 YD STRL (GAUZE/BANDAGES/DRESSINGS) ×6 IMPLANT
GAUZE SPONGE 4X4 16PLY XRAY LF (GAUZE/BANDAGES/DRESSINGS) ×8 IMPLANT
GLOVE BIO SURGEON STRL SZ 6.5 (GLOVE) ×10 IMPLANT
GLOVE BIO SURGEONS STRL SZ 6.5 (GLOVE) ×2
GLOVE BIOGEL PI IND STRL 6.5 (GLOVE) ×4 IMPLANT
GLOVE BIOGEL PI IND STRL 7.0 (GLOVE) ×16 IMPLANT
GLOVE BIOGEL PI INDICATOR 6.5 (GLOVE) ×2
GLOVE BIOGEL PI INDICATOR 7.0 (GLOVE) ×8
GOWN STRL REUS W/TWL LRG LVL3 (GOWN DISPOSABLE) ×28 IMPLANT
HEMOSTAT ARISTA ABSORB 3G PWDR (MISCELLANEOUS) IMPLANT
LEGGING LITHOTOMY PAIR STRL (DRAPES) ×6 IMPLANT
MESH RESTORELLE Y CONTOUR (Mesh General) ×2 IMPLANT
NDL MAYO 6 CRC TAPER PT (NEEDLE) IMPLANT
NEEDLE HYPO 22GX1.5 SAFETY (NEEDLE) ×6 IMPLANT
NEEDLE MAYO 6 CRC TAPER PT (NEEDLE) IMPLANT
NS IRRIG 1000ML POUR BTL (IV SOLUTION) ×8 IMPLANT
PACK ABDOMINAL GYN (CUSTOM PROCEDURE TRAY) ×6 IMPLANT
PACK TRENDGUARD 450 HYBRID PRO (MISCELLANEOUS) IMPLANT
PACK TRENDGUARD 600 HYBRD PROC (MISCELLANEOUS) IMPLANT
PACK VAGINAL WOMENS (CUSTOM PROCEDURE TRAY) ×4 IMPLANT
PAD MAGNETIC INST (MISCELLANEOUS) ×6 IMPLANT
PLUG CATH AND CAP STER (CATHETERS) ×4 IMPLANT
RTRCTR C-SECT PINK 25CM LRG (MISCELLANEOUS) ×6 IMPLANT
SET CYSTO W/LG BORE CLAMP LF (SET/KITS/TRAYS/PACK) ×6 IMPLANT
SHEET LAVH (DRAPES) ×6 IMPLANT
SLING TVT EXACT (Sling) ×2 IMPLANT
SPONGE LAP 18X18 X RAY DECT (DISPOSABLE) ×4 IMPLANT
SPONGE SURGIFOAM ABS GEL 12-7 (HEMOSTASIS) IMPLANT
SURGIFLO W/THROMBIN 8M KIT (HEMOSTASIS) ×2 IMPLANT
SUT CAPIO ETHIBPND (SUTURE) IMPLANT
SUT ETHIBOND 0 (SUTURE) ×16 IMPLANT
SUT PLAIN 2 0 XLH (SUTURE) ×6 IMPLANT
SUT VIC AB 0 CT1 18XCR BRD8 (SUTURE) ×4 IMPLANT
SUT VIC AB 0 CT1 27 (SUTURE) ×12
SUT VIC AB 0 CT1 27XBRD ANBCTR (SUTURE) ×12 IMPLANT
SUT VIC AB 0 CT1 8-18 (SUTURE) ×12
SUT VIC AB 2-0 CT1 (SUTURE) ×6 IMPLANT
SUT VIC AB 2-0 CT1 27 (SUTURE)
SUT VIC AB 2-0 CT1 TAPERPNT 27 (SUTURE) IMPLANT
SUT VIC AB 2-0 CT2 27 (SUTURE) IMPLANT
SUT VIC AB 2-0 SH 27 (SUTURE) ×12
SUT VIC AB 2-0 SH 27XBRD (SUTURE) ×16 IMPLANT
SUT VIC AB 2-0 UR6 27 (SUTURE) IMPLANT
SUT VIC AB 3-0 SH 27 (SUTURE) ×12
SUT VIC AB 3-0 SH 27X BRD (SUTURE) IMPLANT
SUT VIC AB 4-0 PS2 27 (SUTURE) ×6 IMPLANT
SYR 10ML LL (SYRINGE) ×4 IMPLANT
TOWEL OR 17X24 6PK STRL BLUE (TOWEL DISPOSABLE) ×12 IMPLANT
TRAY FOLEY CATH SILVER 14FR (SET/KITS/TRAYS/PACK) ×6 IMPLANT
TRAY FOLEY CATH SILVER 16FR (SET/KITS/TRAYS/PACK) ×6 IMPLANT
TRENDGUARD 450 HYBRID PRO PACK (MISCELLANEOUS) ×6
TRENDGUARD 600 HYBRID PROC PK (MISCELLANEOUS)
TUBING NON-CON 1/4 X 20 CONN (TUBING) ×5 IMPLANT
TUBING NON-CON 1/4 X 20' CONN (TUBING) ×1

## 2017-06-13 NOTE — Progress Notes (Signed)
Update to History and Physical  No marked change in status since office pre-op visit.   Patient examined.   OK to proceed with surgery. 

## 2017-06-13 NOTE — Anesthesia Preprocedure Evaluation (Signed)
Anesthesia Evaluation  Patient identified by MRN, date of birth, ID band Patient awake    Reviewed: Allergy & Precautions, H&P , NPO status , Patient's Chart, lab work & pertinent test results  History of Anesthesia Complications (+) PONV and history of anesthetic complications  Airway Mallampati: II   Neck ROM: full    Dental   Pulmonary neg pulmonary ROS,    breath sounds clear to auscultation       Cardiovascular + Peripheral Vascular Disease  + dysrhythmias + Valvular Problems/Murmurs MVP  Rhythm:regular Rate:Normal  SA node dysfunction   Neuro/Psych    GI/Hepatic   Endo/Other  Hypothyroidism   Renal/GU      Musculoskeletal  (+) Arthritis ,   Abdominal   Peds  Hematology   Anesthesia Other Findings   Reproductive/Obstetrics                             Anesthesia Physical Anesthesia Plan  ASA: II  Anesthesia Plan: General   Post-op Pain Management:    Induction: Intravenous  PONV Risk Score and Plan: 4 or greater and Ondansetron, Dexamethasone, Propofol, Midazolam, Scopolamine patch - Pre-op and Treatment may vary due to age or medical condition  Airway Management Planned: Oral ETT  Additional Equipment:   Intra-op Plan:   Post-operative Plan: Extubation in OR  Informed Consent: I have reviewed the patients History and Physical, chart, labs and discussed the procedure including the risks, benefits and alternatives for the proposed anesthesia with the patient or authorized representative who has indicated his/her understanding and acceptance.     Plan Discussed with: CRNA, Anesthesiologist and Surgeon  Anesthesia Plan Comments:         Anesthesia Quick Evaluation

## 2017-06-13 NOTE — Progress Notes (Signed)
Day of Surgery Procedure(s) (LRB): ABDOMINO SACROCOLPOPEXY with Halbans culdoplasty (N/A) ANTERIOR (CYSTOCELE) AND POSTERIOR REPAIR (RECTOCELE) (N/A) TRANSVAGINAL TAPE (TVT) PROCEDURE (N/A) CYSTOSCOPY (N/A) BILATERAL SALPINGECTOMY (Bilateral)  Subjective: Patient reports good pain control.  Denies nausea.  Not out of bed yet.   Objective: I have reviewed patient's vital signs and intake and output. Vitals:   06/13/17 1529 06/13/17 1615  BP:  (!) 90/41  Pulse:  (!) 57  Resp: 17 18  Temp:      I/O -  2400 cc/1100 cc.   General: alert and cooperative Resp: clear to auscultation bilaterally Cardio: regular rate and rhythm, S1, S2 normal, no murmur, click, rub or gallop GI: incision: clean, dry and intact and scant bowel sounds, soft, nontender, nondistended. Extremities: PAS and ted hose on.  DPs 2+ bilaterally.  Vaginal Bleeding: none  Assessment: s/p Procedure(s): ABDOMINO SACROCOLPOPEXY with Halbans culdoplasty (N/A) ANTERIOR (CYSTOCELE) AND POSTERIOR REPAIR (RECTOCELE) (N/A) TRANSVAGINAL TAPE (TVT) PROCEDURE (N/A) CYSTOSCOPY (N/A) BILATERAL SALPINGECTOMY (Bilateral): stable  Plan: Continue foley due to post op state. Clear liquids Morphine and Toradol for pain control.  Reviewed surgical findings and procedure with patient.  Questions answered.    LOS: 0 days    Cynthia [name] Matthew Saras 06/13/2017, 5:00 PM

## 2017-06-13 NOTE — Op Note (Signed)
OPERATIVE REPORT  PREOPERATIVE DIAGNOSIS: Post hysterectomy vaginal vault prolapse, cystocele, rectocele, genuine stress incontinence.  POSTOPERATIVE DIAGNOSIS: Post hysterectomy vaginal vault prolapse, cystocele, rectocele, genuine stress incontinence.  PROCEDURE:  Abdominal sacral colpopexy, Hallban's culdoplasty, bilateral salpingectomy, anterior and posterior colporrhaphy, TVT Exact midurethral sling and cystoscopy.  SURGEON: Lenard Galloway, MD  ASSISTANT:   Dorothy Spark, MD  ANESTHESIA: General endotracheal, local 1% lidocaine with epinephrine 1:100,000.  IV FLUIDS:  1600 cc LR.  EBL:  600 cc.   URINE OUTPUT:   450 cc.   COMPLICATIONS: None.  INDICATIONS FOR THE PROCEDURE: The patient is a 61 year old Gravida 3, Para 3 Caucasian female, status post total vaginal hysterectomy for uterine prolapse, who presented initially with a rectocele.  The patient's prolapse of the vaginal became progressive to involve her anterior vaginal wall and the vaginal apex.  The plan is now made to proceed with an abdominal sacrocolpopexy, potential Halban's culdoplasty, bilateral salpingectomy, possible bilateral oophorectomy, TVT Exact mid urethral sling, and anterior and posterior colporrhaphy. Risks, benefits, and alternatives have been reviewed with the patient who wishes to proceed.  FINDINGS: Examination under anesthesia revealed a third degree rectocele, first degree apical prolapse and a first degree cystocele.   At the time of laparotomy, the patient was noted to have normal tubes and ovaries.  The uterus was surgically absent.  There was a minor adhesion of the omentum to the anterior vaginal peritoneum just above the vaginal cuff.   The appendix was unremarkable. The liver edge and gallbladder palpated to be normal. The kidneys were unremarkable as was the periaortic region.  All peritoneal surfaces were smooth, and there was no evidence of ascites.  Cystoscopy  at termination of the bladder portion of the procedure documented the bladder to be intact throughout 360 degrees including the bladder dome and trigone. There was no evidence of a foreign body in the bladder or the urethra. The ureters were patent bilaterally.  SPECIMENS:   The bilateral fallopian tubes were sent to Pathology.   PROCEDURE IN DETAIL: The patient was reidentified in the preop hold area. She did receive cefotetan 2 g IV for antibiotic prophylaxis. She received a Lovenox injection, TED hose and PAS stockings for DVT prophylaxis.  The patient was escorted to the operating room, where she was placed in the dorsal lithotomy position in [place]. General endotracheal anesthesia was induced. The patient's lower abdomen, vagina, and perineum were sterilely prepped and she was sterilely draped. The Foley catheter was placed inside the bladder and left to gravity drainage throughout and at the termination of the procedure.  The procedure began by creating a Pfannenstiel incision with a scalpel. The incision was carried down to the subcutaneous tissues using monopolar cautery for hemostasis. The fascia was incised in the midline with a scalpel and the incision was extended bilaterally using the Mayo scissors. The rectus muscles were bluntly divided in the midline. The parietal peritoneum was elevated with 2 hemostat clamps and entered sharply with the Metzenbaum scissors. The peritoneal incision was extended cranially and caudally.  There was a small adhesion between the omentum and the anterior peritoneum, and this was lysed with monopolar cautery.   An Alexis retractor was then placed inside the peritoneal cavity and the patient was placed in the Trendelenburg position. The bowel was packed into the upper abdomen with moistened lap pads.  The bilateral fallopian tubes were removed.  The tubes were each grasped With a Babcock clamp.  A kelly was placed  across the mesosalpinx of the right tube and the pedicle was cut and sutured with 0/0 Vicryl.  The proximal portion of  the tube was excised with monopolar cautery.  This was sent to Pathology.  The  same procedure was performed on the patient's right fallopian tube.  The right  fallopian tube was sent to Pathology. The right round round ligament was close  to the fallopian tube on this side and it bled after excision of the tube.  It was  cauterized with monopolar energy, and it was then sutured with 0/0 Vicryl for  good hemostasis.   The abdominal sacrocolpopexy was performed next. An EEA Sizer was placed in the vagina at this time and the peritoneum along the anterior and posterior vaginal walls was opened and dissection was performed down to the level of the underside of the vagina using a Metzenbaum scissors, both anteriorly  and posteriorly.  Attention at this time was turned to the sacral promontory. The peritoneum overlying the sacral promontory was identified along with the landmarks of the bifurcation of the aorta and iliac vessels and the right ureter. The peritoneum was opened using a combination of a Metzenbaum scissors and monopolar cautery. Careful dissection was performed down to the level of the sacral promontory. The anterior longitudinal ligament was identified along with the middle sacral vessels. Hemostasis was good. The peritoneum was then opened inferiorly along the patient's right pelvic sidewall and all the way down to the level of dissection along the posterior vaginal peritoneal Incision using a Metzenbaum scissors and monopolar cautery for hemostasis.  At this time, the Colpoplast mesh was trimmed to properly fit the patient's anatomy and the mesh was secured to the anterior and posterior vagina  Using 0/0 Ethibond sutures.    The EEA Sizer was removed and a hand was placed inside the vagina and the elevation of the vaginal cuff was simulated.  The point of attachment of the  Colpoplast mesh was then mapped to the sacral promontory and 2 sutures of  0 Ethibond were brought through the anterior longitudinal ligament to the right  of the middle sacral vessels. These sutures were then brought through the mesh.  The sutures were tied. Excess mesh was trimmed at this time. A running suture  of 2/0  Vicryl was used to close the peritoneum over the mesh from the sacral  promontory down to the vaginal cuff.  This allowed 100% coverage of the mesh material.  Hallban's culdoplasty sutures of 0/0 Ethibond were placed in the posterior cul de sac.  The pelvis was once again irrigated and suctioned. Hemostasis was good.  The abdomen was closed. The moistened lap pads and the Alexis retractor were removed from the peritoneal cavity. The parietal peritoneum was closed with a running suture of 2-0 Vicryl. The rectus muscles were examined and hemostasis was good. The fascia was closed  with a running suture of 0 Vicryl. The subcutaneous layer was irrigated and suctioned and made hemostatic with monopolar cautery. Interrupted sutures of 2-0 plain were placed in the subcutaneous layer and the skin was closed with a subcuticular suture of 4-0 Vicryl. Dermabond was used over the incision.  A sterile bandage was later placed.  Attention was turned to the vaginal portion of the procedure at this point. The patient was examined and there was excellent vaginal cuff support. There was no evidence of a cystocele, and there was a first degree rectocele.  Allis clamps were used to mark the anterior vaginal wall  beginning 1 cm below the urethral meatus and extending down toward the vaginal cuff. The mucosa was injected locally with 1% lidocaine with epinephrine 1:100,000. The mucosa was incised vertically in the midline with a scalpel and a combination of sharp and blunt dissection were used to dissect this vaginal tissue and mucosa off  the underlying bladder. The dissection was carried back to the pubic rami.  A 1 cm suprapubic incisions were then created 2 cm to the right and left in the midline. The Foley catheter was removed. The TVT Exact was performed in a bottom up fashion. The urethral guide and Foley tip were placed in the urethra and the urethra was deflected properly as the TVT guide was brought through the right retropubic space and up through the right suprapubic incision. The urethra was then deflected in the opposite direction and the TVT guide was placed through the left retropubic space and through the left suprapubic ipsilateral incision.  The obturator guide was removed at this time and cystoscopy was performed and the findings were as noted above.  The bladder was drained of all cystoscopic fluid and the Foley catheter was replaced. The sling was brought up through the suprapubic incisions bilaterally. The plastic sheaths were removed as a Kelly clamp was placed between the urethra and the sling. The sling was noted to be in excellent position. Excess mesh was trimmed suprapubically. The vaginal skin edges were trimmed at this time and the anterior vaginal wall was closed with a running lock suture of 2-0 Vicryl.  The suprapubic incisions were closed at the termination of the procedure with Dermabond.  Attention was turned to the posterior vaginal wall at this time. Allis clamps were used to mark the introitus, the perineal body, and then the posterior vaginal wall mucosa. The perineal body and the posterior vaginal mucosa were injected with 1% lidocaine with epinephrine 1:100,000. A triangular wedge of epithelium was excised from the perineal body and the posterior vaginal mucosa was incised vertically in the midline with the Metzenbaum scissors. Sharp and blunt dissection were used to dissect the perirectal fascia off the vaginal mucosa bilaterally. The posterior  colporrhaphy was then performed with vertical mattress sutures of 0 Vicryl which reduced the rectocele. A crown stitch of 0 Vicryl were placed along the perineal body. Rectal exam confirmed the absence of sutures in the rectum.Excess vaginal mucosa was trimmed and the posterior vaginal mucosa was closed with a running lock suture of 2-0 Vicryl. This was brought down to the hymenal ring and then under the hymen. The transverse perineal muscles were reapproximated with a running suture of this 2-0 Vicryl. The suture was then brought up the perineal body in a subcuticular fashion as for an episiotomy repair. The knot was tied at the hymen.  A single simple suture of 2/0 Vicryl was placed in the perineal body mucosa to provider closure of the  epithelium.  Vaginal examination confirmed excellent vaginal support and elevation. Final rectal exam documented the absence of sutures in the rectum. A gauze packing with Estrace cream was placed inside the vagina.  At this point, the patient was awakened and extubated, and escorted to the recovery room in stable condition. There were no complications. All needle, instrument, and sponge counts were correct.   Lenard Galloway, M.D.

## 2017-06-13 NOTE — Transfer of Care (Signed)
Immediate Anesthesia Transfer of Care Note  Patient: Cynthia [name]  Procedure(s) Performed: Procedure(s): ABDOMINO SACROCOLPOPEXY with Halbans culdoplasty (N/A) ANTERIOR (CYSTOCELE) AND POSTERIOR REPAIR (RECTOCELE) (N/A) TRANSVAGINAL TAPE (TVT) PROCEDURE (N/A) CYSTOSCOPY (N/A) BILATERAL SALPINGECTOMY (Bilateral)  Patient Location: PACU  Anesthesia Type:General  Level of Consciousness: awake, alert  and oriented  Airway & Oxygen Therapy: Patient Spontanous Breathing and Patient connected to nasal cannula oxygen  Post-op Assessment: Report given to RN and Post -op Vital signs reviewed and stable  Post vital signs: Reviewed and stable  Last Vitals:  Vitals:   06/13/17 0555  BP: 115/75  Pulse: (!) 57  Resp: 16  Temp: 36.5 C    Last Pain:  Vitals:   06/13/17 0555  TempSrc: Oral      Patients Stated Pain Goal: 5 (33/74/45 1460)  Complications: No apparent anesthesia complications

## 2017-06-13 NOTE — Anesthesia Procedure Notes (Signed)
Procedure Name: Intubation Date/Time: 06/13/2017 7:41 AM Performed by: Narda Fundora, Sheron Nightingale Pre-anesthesia Checklist: Patient identified, Patient being monitored, Emergency Drugs available, Timeout performed and Suction available Patient Re-evaluated:Patient Re-evaluated prior to induction Oxygen Delivery Method: Circle system utilized Preoxygenation: Pre-oxygenation with 100% oxygen Induction Type: IV induction Ventilation: Mask ventilation without difficulty Laryngoscope Size: Mac and 3 Grade View: Grade II Tube size: 7.0 mm Number of attempts: 1 Placement Confirmation: ETT inserted through vocal cords under direct vision,  positive ETCO2 and breath sounds checked- equal and bilateral Secured at: 21 cm Dental Injury: Teeth and Oropharynx as per pre-operative assessment

## 2017-06-13 NOTE — Brief Op Note (Signed)
06/13/2017  12:53 PM  PATIENT:  Cynthia [name]  61 y.o. female  PRE-OPERATIVE DIAGNOSIS:  Pelvic organ prolapse after TVH/ cystocele, rectocele   POST-OPERATIVE DIAGNOSIS:  Pelvic organ prolapse after TVH/ cystocele, rectocele   PROCEDURE:  Procedure(s): ABDOMINO SACROCOLPOPEXY with Halbans culdoplasty (N/A) ANTERIOR (CYSTOCELE) AND POSTERIOR REPAIR (RECTOCELE) (N/A) TRANSVAGINAL TAPE (TVT) PROCEDURE (N/A) CYSTOSCOPY (N/A) BILATERAL SALPINGECTOMY (Bilateral)  SURGEON:  Surgeon(s) and Role:    * Amundson Raliegh Ip, MD - Primary    * Talbert Nan, Francesca Jewett, MD - Assisting  PHYSICIAN ASSISTANT: NA  ASSISTANTS: Loletha Carrow, MD   ANESTHESIA:   local and general  EBL:  Total I/O In: 1600 [I.V.:1600] Out: 1050 [Urine:450; Blood:600]  BLOOD ADMINISTERED:none  DRAINS: Urinary Catheter (Foley)   LOCAL MEDICATIONS USED:  LIDOCAINE   SPECIMEN:  Source of Specimen:  bilateral fallopian tubes  DISPOSITION OF SPECIMEN:  PATHOLOGY  COUNTS:  YES  TOURNIQUET:  * No tourniquets in log *  DICTATION: .Note written in EPIC  PLAN OF CARE: Admit to inpatient   PATIENT DISPOSITION:  PACU - hemodynamically stable.   Delay start of Pharmacological VTE agent (>24hrs) due to surgical blood loss or risk of bleeding: Received Lovenox preop .  Will do Ted hose and PAS post op due to intraop EBL.

## 2017-06-14 ENCOUNTER — Encounter (HOSPITAL_COMMUNITY): Payer: Self-pay | Admitting: Obstetrics and Gynecology

## 2017-06-14 LAB — BASIC METABOLIC PANEL
Anion gap: 4 — ABNORMAL LOW (ref 5–15)
BUN: 10 mg/dL (ref 6–20)
CALCIUM: 8.2 mg/dL — AB (ref 8.9–10.3)
CO2: 28 mmol/L (ref 22–32)
CREATININE: 0.62 mg/dL (ref 0.44–1.00)
Chloride: 106 mmol/L (ref 101–111)
GFR calc non Af Amer: >60 mL/min (ref >60)
GLUCOSE: 111 mg/dL — AB (ref 65–99)
Potassium: 4 mmol/L (ref 3.5–5.1)
Sodium: 138 mmol/L (ref 135–145)

## 2017-06-14 LAB — CBC
HEMATOCRIT: 29.5 % — AB (ref 36.0–46.0)
Hemoglobin: 9.7 g/dL — ABNORMAL LOW (ref 12.0–15.0)
MCH: 30.2 pg (ref 26.0–34.0)
MCHC: 32.9 g/dL (ref 30.0–36.0)
MCV: 91.9 fL (ref 78.0–100.0)
Platelets: 183 10*3/uL (ref 150–400)
RBC: 3.21 MIL/uL — ABNORMAL LOW (ref 3.87–5.11)
RDW: 14.4 % (ref 11.5–15.5)
WBC: 11.1 10*3/uL — ABNORMAL HIGH (ref 4.0–10.5)

## 2017-06-14 MED ORDER — DOCUSATE SODIUM 100 MG PO CAPS
100.0000 mg | ORAL_CAPSULE | Freq: Every day | ORAL | Status: DC
  Administered 2017-06-14 – 2017-06-15 (×2): 100 mg via ORAL
  Filled 2017-06-14 (×2): qty 1

## 2017-06-14 NOTE — Anesthesia Postprocedure Evaluation (Signed)
Anesthesia Post Note  Patient: Cynthia [name]  Procedure(s) Performed: Procedure(s) (LRB): ABDOMINO SACROCOLPOPEXY with Halbans culdoplasty (N/A) ANTERIOR (CYSTOCELE) AND POSTERIOR REPAIR (RECTOCELE) (N/A) TRANSVAGINAL TAPE (TVT) PROCEDURE (N/A) CYSTOSCOPY (N/A) BILATERAL SALPINGECTOMY (Bilateral)     Patient location during evaluation: [facility] Anesthesia Type: General Level of consciousness: awake Pain management: satisfactory to patient Vital Signs Assessment: post-procedure vital signs reviewed and stable Respiratory status: spontaneous breathing Cardiovascular status: stable Anesthetic complications: no    Last Vitals:  Vitals:   06/14/17 0600 06/14/17 0828  BP:  (!) 92/35  Pulse:  60  Resp: 17 16  Temp:  36.8 C    Last Pain:  Vitals:   06/14/17 0828  TempSrc: Oral  PainSc:    Pain Goal: Patients Stated Pain Goal: 5 (06/13/17 0555)               Casimer Lanius

## 2017-06-14 NOTE — Progress Notes (Signed)
1 Day Post-Op Procedure(s) (LRB): ABDOMINO SACROCOLPOPEXY with Halbans culdoplasty (N/A) ANTERIOR (CYSTOCELE) AND POSTERIOR REPAIR (RECTOCELE) (N/A) TRANSVAGINAL TAPE (TVT) PROCEDURE (N/A) CYSTOSCOPY (N/A) BILATERAL SALPINGECTOMY (Bilateral)  Subjective: Patient reports incisional pain, tolerating PO and no problems voiding.   Denies dizziness or lightheadedness.  Denies palpitations.   Objective: I have reviewed patient's vital signs and intake and output. Vitals:   06/14/17 1239 06/14/17 1552  BP: (!) 94/49 (!) 99/33  Pulse: 61 (!) 49  Resp: 16 16  Temp: 97.9 F (36.6 C) 98.1 F (36.7 C)     Resp: clear to auscultation bilaterally Cardio: regular rate and rhythm, S1, S2 normal, no murmur, click, rub or gallop  Assessment: s/p Procedure(s): ABDOMINO SACROCOLPOPEXY with Halbans culdoplasty (N/A) ANTERIOR (CYSTOCELE) AND POSTERIOR REPAIR (RECTOCELE) (N/A) TRANSVAGINAL TAPE (TVT) PROCEDURE (N/A) CYSTOSCOPY (N/A) BILATERAL SALPINGECTOMY (Bilateral): progressing well Low BP and pulse rate.  This is consistent with her preop vitals and her recent anesthesia.  Plan: check CBC in am.   Plan for discharge tomorrow.    LOS: 1 day    Arloa Koh 06/14/2017, 6:25 PM

## 2017-06-14 NOTE — Progress Notes (Signed)
1 Day Post-Op Procedure(s) (LRB): ABDOMINO SACROCOLPOPEXY with Halbans culdoplasty (N/A) ANTERIOR (CYSTOCELE) AND POSTERIOR REPAIR (RECTOCELE) (N/A) TRANSVAGINAL TAPE (TVT) PROCEDURE (N/A) CYSTOSCOPY (N/A) BILATERAL SALPINGECTOMY (Bilateral)  Subjective: Patient reports incisional pain and + flatus.   Foley and vaginal pack out.  No void yet.  Ambulated once last hs.  Objective: I have reviewed patient's vital signs, intake and output and labs. Vitals:   06/14/17 0411 06/14/17 0600  BP: (!) 99/42   Pulse: 64   Resp: 19 17  Temp: 98.6 F (37 C)     I/O - 2400 cc/1650 cc.  Glucose 111, Ca 8.2  CBC    Component Value Date/Time   WBC 11.1 (H) 06/14/2017 0512   RBC 3.21 (L) 06/14/2017 0512   HGB 9.7 (L) 06/14/2017 0512   HGB 14.4 06/14/2011   HCT 29.5 (L) 06/14/2017 0512   PLT 183 06/14/2017 0512   PLT 318 06/14/2011   MCV 91.9 06/14/2017 0512   MCH 30.2 06/14/2017 0512   MCHC 32.9 06/14/2017 0512   RDW 14.4 06/14/2017 0512   RDW 12.7 06/14/2011   LYMPHSABS 2.8 05/25/2016 0920   MONOABS 0.4 05/25/2016 0920   EOSABS 0.2 05/25/2016 0920   BASOSABS 0.0 05/25/2016 0920       General: alert and cooperative Resp: clear to auscultation bilaterally Cardio: regular rate and rhythm, S1, S2 normal, no murmur, click, rub or gallop GI: soft, non-tender; bowel sounds normal; no masses,  no organomegaly and incision: clean, dry and intact Extremities: Pas and Ted hose on.  Vaginal Bleeding: minimal  Assessment: s/p Procedure(s): ABDOMINO SACROCOLPOPEXY with Halbans culdoplasty (N/A) ANTERIOR (CYSTOCELE) AND POSTERIOR REPAIR (RECTOCELE) (N/A) TRANSVAGINAL TAPE (TVT) PROCEDURE (N/A) CYSTOSCOPY (N/A) BILATERAL SALPINGECTOMY (Bilateral): progressing well  Plan: Advance diet Encourage ambulation Advance to PO medication Discontinue IV fluids voiding trials.   LOS: 1 day    [employer] 06/14/2017, 7:59 AM

## 2017-06-15 LAB — CBC
HCT: 27.4 % — ABNORMAL LOW (ref 36.0–46.0)
Hemoglobin: 9.1 g/dL — ABNORMAL LOW (ref 12.0–15.0)
MCH: 30.7 pg (ref 26.0–34.0)
MCHC: 33.2 g/dL (ref 30.0–36.0)
MCV: 92.6 fL (ref 78.0–100.0)
PLATELETS: 185 10*3/uL (ref 150–400)
RBC: 2.96 MIL/uL — ABNORMAL LOW (ref 3.87–5.11)
RDW: 14.5 % (ref 11.5–15.5)
WBC: 8.4 10*3/uL (ref 4.0–10.5)

## 2017-06-15 MED ORDER — IBUPROFEN 600 MG PO TABS: 600.0000 mg | ORAL_TABLET | Freq: Four times a day (QID) | ORAL | 0 refills | Status: DC | PRN

## 2017-06-15 MED ORDER — OXYCODONE-ACETAMINOPHEN 5-325 MG PO TABS: 1.0000 | ORAL_TABLET | ORAL | 0 refills | Status: DC | PRN

## 2017-06-15 MED ORDER — DOCUSATE SODIUM 100 MG PO CAPS: 100.0000 mg | ORAL_CAPSULE | Freq: Every day | ORAL | 0 refills | Status: DC

## 2017-06-15 NOTE — Discharge Instructions (Signed)
Sacrocolpopexy, Care After Refer to this sheet in the next few weeks. These instructions provide you with information on caring for yourself after your procedure. Your health care provider may also give you more specific instructions. Your treatment has been planned according to current medical practices, but problems sometimes occur. Call your health care provider if you have any problems or questions after your procedure. What can I expect after the procedure? After your procedure, it is typical to have the following:  Pain.  Some vaginal bleeding.  Fatigue.  Follow these instructions at home: Medicine  Only take medicines as directed by your health care provider.  Make sure to finish antibiotic medicine even if you start to feel better. Bandages  Care for your bandages and cuts (incisions) as directed by your surgeon.  Do not shower until after your bandages have been removed. Activity  Take at least two 10-minute walks each day.  Ask your health care provider when you can return to work and do all your usual activities. It may take 3 months to recover completely.  You will have to restrict many activities when you first return home. For at least 6 weeks: ? Do not lift anything heavier than a gallon of milk. ? Do not sit on a bike seat. ? Do not use a tampon or put anything inside your vagina. ? Do not have sexual intercourse. ? Do not participate instrenuous activities like running or aerobics. Other Instructions  Do not take a bath or go swimming until your health care provider says you can. Most people can shower after about 6 weeks after the procedure.  Drink enough fluids to keep your urine clear or pale yellow. This helps prevent constipation.  See your health care provider to have your stitches or staples removed if necessary.  Keep all follow-up appointments. Contact a health care provider if:  You have chills or fever.  Your pain medicine is not  helping.  You have vaginal bleeding or discharge that will not go away. Get help right away if:  You have a fever for more than 2-3 days.  You have very bad pain.  You have heavy vaginal bleeding or discharge.  You have any signs of infection around your cut (incision). Watch for: ? Redness. ? Tenderness. ? Warmth. ? Pus or other discharge.  You develop a warm, tender area in your leg.  You have chest pain or trouble breathing. This information is not intended to replace advice given to you by your health care provider. Make sure you discuss any questions you have with your health care provider. Document Released: 11/19/2013 Document Revised: 04/21/2016 Document Reviewed: 10/18/2013 Elsevier Interactive Patient Education  2018 [employer].  Anterior and Posterior Colporrhaphy and Sling Procedure, Care After This sheet gives you information about how to care for yourself after your procedure. Your health care provider may also give you more specific instructions. If you have problems or questions, contact your health care provider. What can I expect after the procedure? After the procedure, it is common to have:  Pain in the surgical area.  Vaginal discharge. You will need to use a sanitary pad during this time.  Fatigue.  Follow these instructions at home: Incision care  Follow instructions from your health care provider about how to take care of your incision. Make sure you: ? Wash your hands with soap and water before touching the incision area. If soap and water are not available, use hand sanitizer. ? Clean your incision  as told by your health care provider. ? Leave stitches (sutures), skin glue, or adhesive strips in place. These skin closures may need to stay in place for 2 weeks or longer. If adhesive strip edges start to loosen and curl up, you may trim the loose edges. Do not remove adhesive strips completely unless your health care provider tells you to do  that.  Check your incision area every day for signs of infection. Check for: ? Redness, swelling, or pain. ? Fluid or blood. ? Warmth. ? Pus or a bad smell.  Check your incision every day to make sure the incision area is not separating or opening.  Do not take baths, swim, or use a hot tub until your health care provider approves. You may shower.  Keep the area between your vagina and rectum (perineal area) clean and dry. Make sure you clean the area after each bowel movement and each time you urinate.  Ask your health care provider if you can take a sitz bath or sit in a tub of clean, warm water. Activity  Do gentle, daily activity as told by your health care provider. You may be told to take short walks every day and go farther each time. Ask your health care provider what activities are safe for you.  Limit stair climbing to once or twice a day in the first week, then slowly increase this activity.  Do not lift anything that is heavier than 10 lbs. (4.5 kg), or the limit that your health care provider tells you, until he or she says that it is safe. Avoid pushing or pulling motions.  Avoid standing for long periods of time.  Do not douche, use tampons, or have sex until your health care provider says it is okay.  Do not drive or use heavy machinery while taking prescription pain medicine. To prevent constipation  To prevent or treat constipation while you are taking prescription pain medicine, your health care provider may recommend that you: ? Take over-the-counter or prescription medicines. ? Eat foods that are high in fiber, such as fresh fruits and vegetables, whole grains, and beans. ? Drink enough fluid to keep your urine clear or pale yellow. ? Limit foods that are high in fat and processed sugars, such as fried and sweet foods. General instructions  You may be instructed to do pelvic floor exercises (kegels) as told by your health care provider.  Take  over-the-counter and prescription medicines only as told by your health care provider.  Keep all follow-up visits as told by your health care provider. This is important. Contact a health care provider if:  Medicine does not help your pain.  You have frequent or urgent urination, or you are unable to completely empty your bladder.  You feel a burning sensation when urinating.  You have fluid or blood coming from your incision.  You have pus or a bad smell coming from the incision.  Your incision feels warm to the touch.  You have redness, swelling, or pain around your incision. Get help right away if:  You have a fever or chills.  Your incision separates or opens.  You cannot urinate.  You have trouble breathing. Summary  After the procedure, it is common to have pain, fatigue, and discharge from the vagina.  Keep the area between your vagina and rectum (perineal area) clean and dry. Make sure you clean the area after each bowel movement and each time you urinate.  Follow instructions from your health  care provider on any activity restrictions after the procedure. This information is not intended to replace advice given to you by your health care provider. Make sure you discuss any questions you have with your health care provider. Document Released: 06/28/2004 Document Revised: 11/14/2016 Document Reviewed: 11/14/2016 Elsevier Interactive Patient Education  2017 [employer].

## 2017-06-15 NOTE — Progress Notes (Signed)
Patient given discharge instructions.  She verbalizes understanding and will follow up with Dr Quincy Simmonds in 4 days.

## 2017-06-15 NOTE — Progress Notes (Signed)
2 Days Post-Op Procedure(s) (LRB): ABDOMINO SACROCOLPOPEXY with Halbans culdoplasty (N/A) ANTERIOR (CYSTOCELE) AND POSTERIOR REPAIR (RECTOCELE) (N/A) TRANSVAGINAL TAPE (TVT) PROCEDURE (N/A) CYSTOSCOPY (N/A) BILATERAL SALPINGECTOMY (Bilateral)  Subjective: Patient reports tolerating PO and no problems voiding.   Good pain control.   Objective: I have reviewed patient's vital signs and labs and pathology report.  Vitals:   06/14/17 2019 06/15/17 0750  BP: (!) 100/35 (!) 96/50  Pulse: 70 (!) 55  Resp: 18 16  Temp: 98.8 F (37.1 C) 98.3 F (36.8 C)   Hgb 9.1. Final surgical pathology:  Benign tubes.  General: alert and cooperative Resp: clear to auscultation bilaterally Cardio: regular rate and rhythm, S1, S2 normal, no murmur, click, rub or gallop GI: soft, non-tender; bowel sounds normal; no masses,  no organomegaly and incision: clean, dry and intact Extremities: Ted hose on. Vaginal Bleeding: none  Assessment: s/p Procedure(s): ABDOMINO SACROCOLPOPEXY with Halbans culdoplasty (N/A) ANTERIOR (CYSTOCELE) AND POSTERIOR REPAIR (RECTOCELE) (N/A) TRANSVAGINAL TAPE (TVT) PROCEDURE (N/A) CYSTOSCOPY (N/A) BILATERAL SALPINGECTOMY (Bilateral): ready for discahrge.  Plan: Discharge home.  Instructions reviewed in verbal and written form.  Percocet and Motrin for pain.  Miralax daily until has a BM.  Then will start Colace 100 mg po daily.  No iron supplementation until her bowel movements start.  Follow up in the office in 4 days.   LOS: 2 days    Arloa Koh 06/15/2017, 8:56 PM

## 2017-06-19 ENCOUNTER — Ambulatory Visit (INDEPENDENT_AMBULATORY_CARE_PROVIDER_SITE_OTHER): Payer: BC Managed Care – PPO | Admitting: Obstetrics and Gynecology

## 2017-06-19 ENCOUNTER — Encounter: Payer: Self-pay | Admitting: Obstetrics and Gynecology

## 2017-06-19 VITALS — BP 108/70 | HR 84 | Resp 16 | Wt 203.0 lb

## 2017-06-19 DIAGNOSIS — Z9889 Other specified postprocedural states: Secondary | ICD-10-CM

## 2017-06-19 DIAGNOSIS — D649 Anemia, unspecified: Secondary | ICD-10-CM

## 2017-06-19 NOTE — Progress Notes (Signed)
GYNECOLOGY  VISIT   HPI: 61 y.o.   Married  Caucasian  female   G3P3 with No LMP recorded. Patient has had a hysterectomy.   here for  1 week post op  ABDOMINO SACROCOLPOPEXY with Halbans culdoplasty (N/A Abdomen); ANTERIOR (CYSTOCELE) AND POSTERIOR REPAIR (RECTOCELE) (N/A Vagina ); TRANSVAGINAL TAPE (TVT) PROCEDURE (N/A Vagina ); CYSTOSCOPY (N/A Bladder); BILATERAL SALPINGECTOMY (Bilateral Abdomen)   Pathology report:  Benign fallopian tubes.   Incision is stinging and painful on the left side. Lower back pain from sleeping on her back. Not taking Percocet often.  Using ibuprofen regularly.   Having some BMs every day and are soft.  Painful gas.  Voiding well.   Not a lot of vaginal bleeding.   Walked a little yesterday.   GYNECOLOGIC HISTORY: No LMP recorded. Patient has had a hysterectomy. Contraception:  Hysterectomy Menopausal hormone therapy: Premarin (not using currently)  Last mammogram:  01-16-17 Density B/Neg/BiRads1:TBC Last pap smear:   Done years ago        OB History    Gravida Para Term Preterm AB Living   3 3       3    SAB TAB Ectopic Multiple Live Births           3         Patient Active Problem List   Diagnosis Date Noted  . Status post laparotomy 06/13/2017  . MVP (mitral valve prolapse) 03/14/2017  . Family history of aortic aneurysm 03/14/2017  . Dizziness 10/11/2016  . Osteoarthritis 05/25/2016  . Elevated hemoglobin A1c 05/25/2016  . BMI 34.0-34.9,adult 05/25/2016  . Preoperative cardiovascular examination 05/12/2015  . Low T4 05/12/2015  . Vitamin D deficiency 05/12/2015  . Prediabetes 05/12/2015  . History of colon polyps 05/27/2014  . Routine general medical examination at a health care facility 05/27/2014  . Vasomotor flushing 03/17/2014  . Personal history of cardiac arrhythmia 03/17/2014    Past Medical History:  Diagnosis Date  . Arthritis   . Cancer ([place])    skin on nose  . Cervical disc disease   . Cystocele   . Heart  murmur   . History of adenomatous polyp of colon 06/24/15  . History of rheumatic fever    childhood  . Hypothyroidism   . 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 surveillance  . Mitral valve prolapse   . PONV (postoperative nausea and vomiting)   . Primary osteoarthritis of right hip    Intraarticular steroid injection by Dr. Nelva Bush 01/2016  . Rectocele   . Rectocele   . SA node dysfunction [facility])     Past Surgical History:  Procedure Laterality Date  . ABDOMINAL HYSTERECTOMY  05/1996   TVH--ovaries remain  . ABDOMINAL SACROCOLPOPEXY N/A 06/13/2017   Procedure: ABDOMINO SACROCOLPOPEXY with Halbans culdoplasty;  Surgeon: Nunzio Cobbs, MD;  Location: [facility];  Service: Gynecology;  Laterality: N/A;  . ANTERIOR AND POSTERIOR REPAIR N/A 06/13/2017   Procedure: ANTERIOR (CYSTOCELE) AND POSTERIOR REPAIR (RECTOCELE);  Surgeon: Nunzio Cobbs, MD;  Location: [facility];  Service: Gynecology;  Laterality: N/A;  . arthroscopic knee Right   . BACK SURGERY  2012   disc rupture  . BILATERAL SALPINGECTOMY Bilateral 06/13/2017   Procedure: BILATERAL SALPINGECTOMY;  Surgeon: Nunzio Cobbs, MD;  Location: [facility];  Service: Gynecology;  Laterality: Bilateral;  . BLADDER SUSPENSION N/A 06/13/2017   Procedure: TRANSVAGINAL TAPE (TVT) PROCEDURE;  Surgeon: Judeth Horn  Raliegh Ip, MD;  Location: [facility];  Service: Gynecology;  Laterality: N/A;  . COLONOSCOPY W/ POLYPECTOMY  06/24/15   Recall 5 yrs (Dr. [facility])  . CYSTOSCOPY N/A 06/13/2017   Procedure: CYSTOSCOPY;  Surgeon: Nunzio Cobbs, MD;  Location: [facility];  Service: Gynecology;  Laterality: N/A;  . KNEE RECONSTRUCTION Right 2008  . LIVER BIOPSY  1997   fatty tumors    Current Outpatient Prescriptions  Medication Sig Dispense Refill  . aspirin 81 MG tablet Take 81 mg by mouth daily.    . Calcium Carb-Cholecalciferol (CALCIUM 600+D3) 600-800 MG-UNIT TABS Take  1 tablet by mouth daily.    Marland Kitchen docusate sodium (COLACE) 100 MG capsule Take 1 capsule (100 mg total) by mouth daily. 10 capsule 0  . escitalopram (LEXAPRO) 10 MG tablet Take 1 tablet (10 mg total) by mouth daily. 90 tablet 0  . ibuprofen (ADVIL,MOTRIN) 600 MG tablet Take 1 tablet (600 mg total) by mouth every 6 (six) hours as needed (mild pain). 30 tablet 0  . levothyroxine (LEVOTHROID) 25 MCG tablet Take 1 tablet (25 mcg total) by mouth daily before breakfast. 90 tablet 0  . oxyCODONE-acetaminophen (PERCOCET/ROXICET) 5-325 MG tablet Take 1-2 tablets by mouth every 4 (four) hours as needed for severe pain (moderate to severe pain (when tolerating fluids)). 30 tablet 0  . polyethylene glycol powder (GLYCOLAX/MIRALAX) powder Take 17 g by mouth daily as needed for moderate constipation.      No current facility-administered medications for this visit.      ALLERGIES: Patient has no known allergies.  Family History  Problem Relation Age of Onset  . Alcohol abuse Mother   . CVA Mother        Dec 65  . Heart disease Mother   . Cancer Father        prostate  . Arthritis Father   . Heart murmur Son   . Alcohol abuse Sister   . Heart disease Brother        congenital heart defect--Dec age 56 from MI  . Heart murmur Son   . Heart murmur Son   . Cancer Maternal Aunt 50       Dec colon CA age 16  . Cancer Cousin 42       Dec colon CA age 74    Social History   Social History  . Marital status: Married    Spouse name: [name]  . Number of children: 3  . Years of education: N/A   Occupational History  . [employer]   Social History Main Topics  . Smoking status: Never Smoker  . Smokeless tobacco: Never Used  . Alcohol use 2.4 oz/week    4 Glasses of wine per week  . Drug use: No  . Sexual activity: No     Comment: Hyst   Other Topics Concern  . Not on file   Social History Narrative   Ms. Vetsch lives in [place] with her husband.  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 She has 3 grown sons. She is from [place].    ROS:  Pertinent items are noted in HPI.  PHYSICAL EXAMINATION:    BP 108/70 (BP Location: Right Arm, Patient Position: Sitting, Cuff Size: Normal)   Pulse 84   Resp 16   Wt 203 lb (92.1 kg)   BMI 30.87 kg/m     General appearance:  alert, cooperative and appears stated age   Abdomen: incision intact, soft, non-tender, no masses,  no organomegaly   Pelvic: External genitalia:  SP incisions intact with ecchymoses of the Mons region.  No induration.              Urethra:  normal appearing urethra with no masses, tenderness or lesions                   Bimanual Exam:  Uterus:  Absent.  Suture lines intact.  No bleeding.              Adnexa: no mass, fullness, tenderness       Chaperone was present for exam.  ASSESSMENT  Doing well post op.  I think she is not using enough of her pain medication.   PLAN  OK to use Percocet 1 every 4 - 6 hour prn.  She still has enough.  Check CBC.  I expect the need for Fe po q day for 4 - 6 weeks.  Keep 6 week post op.   An After Visit Summary was printed and given to the patient.

## 2017-06-20 LAB — CBC
HEMATOCRIT: 30.2 % — AB (ref 34.0–46.6)
HEMOGLOBIN: 10.3 g/dL — AB (ref 11.1–15.9)
MCH: 29.3 pg (ref 26.6–33.0)
MCHC: 34.1 g/dL (ref 31.5–35.7)
MCV: 86 fL (ref 79–97)
Platelets: 317 10*3/uL (ref 150–379)
RBC: 3.52 x10E6/uL — ABNORMAL LOW (ref 3.77–5.28)
RDW: 14.6 % (ref 12.3–15.4)
WBC: 7.6 10*3/uL (ref 3.4–10.8)

## 2017-06-25 NOTE — Discharge Summary (Signed)
Physician Discharge Summary  Patient ID: Cynthia [name] MRN: [id number] DOB/AGE: Sep 25, 1956 61 y.o.  Admit date: 06/13/2017 Discharge date:  06/15/17 Admission Diagnoses: 1.  Post hysterectomy vaginal vault prolapse. 2.  Cystocele. 3.  Rectocele.  Discharge Diagnoses:  1.  Post hysterectomy vaginal vault prolapse. 2.  Cystocele. 3.  Rectocele. 4.  Status post abdominal sacral colpopexy, Hallban's culdoplasty, bilateral salpingectomy, anterior and posterior colporrhaphy, TVT Exact midurethral sling and cystoscopy.  Active Problems:   Status post laparotomy   Discharged Condition: good  Hospital Course:  The patient was admitted on 05/1717  for an abdominal sacral colpopexy, Hallban's culdoplasty, bilateral salpingectomy, anterior and posterior colporrhaphy, TVT Exact midurethral sling and cystoscopy which were performed without complication while under general anesthesia.  The patient's post op course was uneventful.  She had a morphine PCA and Toradol for pain control initially, and this was converted over to Percocet and Motrin on post op day one when the patient began taking po well.  She ambulated independently and wore PAS and Ted hose for DVT prophylaxis while in bed. She did received Lovenox pre-op as well.  Her vaginal packing and foley catheter were removed on post op day one, and she voided good volumes. The patient's vital signs remained stable and she demonstrated no signs of infection during her hospitalization.  The patient's post op day one Hgb was 9.7.   She was tolerating the this well.  She had very minimal vaginal bleeding, and her incision demonstrated no signs of erythema or significant drainage.  She was found to be in good condition and ready for discharge on post op day two.  Consults: None  Significant Diagnostic Studies: labs:  See Hospital Course.  Her pathology report demonstrated normal fallopian tubes.  Treatments: surgery:  abdominal sacral colpopexy,  Hallban's culdoplasty, bilateral salpingectomy, anterior and posterior colporrhaphy, TVT Exact midurethral sling and cystoscopy on 06/13/17.  Discharge Exam: Blood pressure (!) 96/50, pulse (!) 55, temperature 98.3 F (36.8 C), temperature source Oral, resp. rate 16, height 5' 7.99" (1.727 m), weight 201 lb (91.2 kg), SpO2 100 %. General: alert and cooperative Resp: clear to auscultation bilaterally Cardio: regular rate and rhythm, S1, S2 normal, no murmur, click, rub or gallop GI: soft, non-tender; bowel sounds normal; no masses,  no organomegaly and incision: clean, dry and intact Extremities: Ted hose on. Vaginal Bleeding: none   Disposition: 01-Home or Self Care Discharge instructions were given in verbal and written form.  She will start iron therapy after her bowel function has returned.  Allergies as of 06/15/2017   No Known Allergies     Medication List    STOP taking these medications   conjugated estrogens vaginal cream Commonly known as:  PREMARIN   NON FORMULARY     TAKE these medications   aspirin 81 MG tablet Take 81 mg by mouth daily.   CALCIUM 600+D3 600-800 MG-UNIT Tabs Generic drug:  Calcium Carb-Cholecalciferol Take 1 tablet by mouth daily.   docusate sodium 100 MG capsule Commonly known as:  COLACE Take 1 capsule (100 mg total) by mouth daily.   escitalopram 10 MG tablet Commonly known as:  LEXAPRO Take 1 tablet (10 mg total) by mouth daily.   ibuprofen 600 MG tablet Commonly known as:  ADVIL,MOTRIN Take 1 tablet (600 mg total) by mouth every 6 (six) hours as needed (mild pain).   levothyroxine 25 MCG tablet Commonly known as:  LEVOTHROID Take 1 tablet (25 mcg total) by mouth daily before breakfast.  oxyCODONE-acetaminophen 5-325 MG tablet Commonly known as:  PERCOCET/ROXICET Take 1-2 tablets by mouth every 4 (four) hours as needed for severe pain (moderate to severe pain (when tolerating fluids)).   polyethylene glycol powder  powder Commonly known as:  GLYCOLAX/MIRALAX Take 17 g by mouth daily as needed for moderate constipation.      Follow-up Information    Nunzio Cobbs, MD In 4 days.   Specialty:  Obstetrics and Gynecology Contact information: [address] [place] [address] [phone]           Signed: Arloa Koh 06/25/2017, 9:51 AM

## 2017-06-29 ENCOUNTER — Encounter: Payer: Self-pay | Admitting: Obstetrics and Gynecology

## 2017-06-29 ENCOUNTER — Ambulatory Visit (INDEPENDENT_AMBULATORY_CARE_PROVIDER_SITE_OTHER): Payer: BC Managed Care – PPO | Admitting: Obstetrics and Gynecology

## 2017-06-29 ENCOUNTER — Telehealth: Payer: Self-pay

## 2017-06-29 VITALS — BP 122/70 | HR 84 | Resp 16 | Wt 203.0 lb

## 2017-06-29 DIAGNOSIS — N898 Other specified noninflammatory disorders of vagina: Secondary | ICD-10-CM

## 2017-06-29 DIAGNOSIS — Z9889 Other specified postprocedural states: Secondary | ICD-10-CM

## 2017-06-29 MED ORDER — METRONIDAZOLE 500 MG PO TABS: 500.0000 mg | ORAL_TABLET | Freq: Two times a day (BID) | ORAL | 0 refills | Status: DC

## 2017-06-29 NOTE — Progress Notes (Signed)
GYNECOLOGY  VISIT   HPI: 61 y.o.   Married  Caucasian  female   G3P3 with No LMP recorded. Patient has had a hysterectomy.   here for hard lumps inside the vagina per patient.   Pain control is good.   She is noticing a foul odor.  Not sure where it is coming from.  Denies fever.  BMs were ok until she started the iron.  Now constipated.  Taking Miralax and stool softeners.  Today noticed lumps in the vagina with bathing.   GYNECOLOGIC HISTORY: No LMP recorded. Patient has had a hysterectomy. Contraception:  Hysterectomy Menopausal hormone therapy: none Last mammogram:  01-16-17 Density B/Neg/BiRads1:TBC Last pap smear:   Done years ago        OB History    Gravida Para Term Preterm AB Living   3 3       3    SAB TAB Ectopic Multiple Live Births           3         Patient Active Problem List   Diagnosis Date Noted  . Status post laparotomy 06/13/2017  . MVP (mitral valve prolapse) 03/14/2017  . Family history of aortic aneurysm 03/14/2017  . Dizziness 10/11/2016  . Osteoarthritis 05/25/2016  . Elevated hemoglobin A1c 05/25/2016  . BMI 34.0-34.9,adult 05/25/2016  . Preoperative cardiovascular examination 05/12/2015  . Low T4 05/12/2015  . Vitamin D deficiency 05/12/2015  . Prediabetes 05/12/2015  . History of colon polyps 05/27/2014  . Routine general medical examination at a health care facility 05/27/2014  . Vasomotor flushing 03/17/2014  . Personal history of cardiac arrhythmia 03/17/2014    Past Medical History:  Diagnosis Date  . Arthritis   . Cancer ([place])    skin on nose  . Cervical disc disease   . Cystocele   . Heart murmur   . History of adenomatous polyp of colon 06/24/15  . History of rheumatic fever    childhood  . Hypothyroidism   . 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 surveillance  . Mitral valve prolapse   . PONV (postoperative nausea and vomiting)   . Primary osteoarthritis of right hip    Intraarticular  steroid injection by Dr. Nelva Bush 01/2016  . Rectocele   . Rectocele   . SA node dysfunction [facility])     Past Surgical History:  Procedure Laterality Date  . ABDOMINAL HYSTERECTOMY  05/1996   TVH--ovaries remain  . ABDOMINAL SACROCOLPOPEXY N/A 06/13/2017   Procedure: ABDOMINO SACROCOLPOPEXY with Halbans culdoplasty;  Surgeon: Nunzio Cobbs, MD;  Location: [facility];  Service: Gynecology;  Laterality: N/A;  . ANTERIOR AND POSTERIOR REPAIR N/A 06/13/2017   Procedure: ANTERIOR (CYSTOCELE) AND POSTERIOR REPAIR (RECTOCELE);  Surgeon: Nunzio Cobbs, MD;  Location: [facility];  Service: Gynecology;  Laterality: N/A;  . arthroscopic knee Right   . BACK SURGERY  2012   disc rupture  . BILATERAL SALPINGECTOMY Bilateral 06/13/2017   Procedure: BILATERAL SALPINGECTOMY;  Surgeon: Nunzio Cobbs, MD;  Location: [facility];  Service: Gynecology;  Laterality: Bilateral;  . BLADDER SUSPENSION N/A 06/13/2017   Procedure: TRANSVAGINAL TAPE (TVT) PROCEDURE;  Surgeon: Nunzio Cobbs, MD;  Location: [facility];  Service: Gynecology;  Laterality: N/A;  . COLONOSCOPY W/ POLYPECTOMY  06/24/15   Recall 5 yrs (Dr. [facility])  . CYSTOSCOPY N/A 06/13/2017   Procedure: CYSTOSCOPY;  Surgeon: Yisroel Ramming, Dietrich Pates  E, MD;  Location: [facility];  Service: Gynecology;  Laterality: N/A;  . KNEE RECONSTRUCTION Right 2008  . LIVER BIOPSY  1997   fatty tumors    Current Outpatient Prescriptions  Medication Sig Dispense Refill  . Calcium Carb-Cholecalciferol (CALCIUM 600+D3) 600-800 MG-UNIT TABS Take 1 tablet by mouth daily.    Marland Kitchen docusate sodium (COLACE) 100 MG capsule Take 1 capsule (100 mg total) by mouth daily. 10 capsule 0  . escitalopram (LEXAPRO) 10 MG tablet Take 1 tablet (10 mg total) by mouth daily. 90 tablet 0  . Ferrous Sulfate (IRON) 325 (65 Fe) MG TABS Take 1 tablet by mouth daily.    Marland Kitchen ibuprofen (ADVIL,MOTRIN) 600 MG tablet Take 1 tablet (600 mg total) by mouth  every 6 (six) hours as needed (mild pain). 30 tablet 0  . levothyroxine (LEVOTHROID) 25 MCG tablet Take 1 tablet (25 mcg total) by mouth daily before breakfast. 90 tablet 0  . oxyCODONE-acetaminophen (PERCOCET/ROXICET) 5-325 MG tablet Take 1-2 tablets by mouth every 4 (four) hours as needed for severe pain (moderate to severe pain (when tolerating fluids)). 30 tablet 0  . polyethylene glycol powder (GLYCOLAX/MIRALAX) powder Take 17 g by mouth daily as needed for moderate constipation.     Marland Kitchen aspirin 81 MG tablet Take 81 mg by mouth daily.     No current facility-administered medications for this visit.      ALLERGIES: Patient has no known allergies.  Family History  Problem Relation Age of Onset  . Alcohol abuse Mother   . CVA Mother        Dec 65  . Heart disease Mother   . Cancer Father        prostate  . Arthritis Father   . Heart murmur Son   . Alcohol abuse Sister   . Heart disease Brother        congenital heart defect--Dec age 50 from MI  . Heart murmur Son   . Heart murmur Son   . Cancer Maternal Aunt 50       Dec colon CA age 27  . Cancer Cousin 26       Dec colon CA age 62    Social History   Social History  . Marital status: Married    Spouse name: [name]  . Number of children: 3  . Years of education: N/A   Occupational History  . [employer]   Social History Main Topics  . Smoking status: Never Smoker  . Smokeless tobacco: Never Used  . Alcohol use 2.4 oz/week    4 Glasses of wine per week  . Drug use: No  . Sexual activity: No     Comment: Hyst   Other Topics Concern  . Not on file   Social History Narrative   Ms. Kitchens lives in [place] with her husband.  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 She is from [place].    ROS:  Pertinent items are noted in HPI.  PHYSICAL EXAMINATION:    BP 122/70 (BP Location: Right Arm, Patient Position: Sitting, Cuff  Size: Normal)   Pulse 84   Resp 16   Wt 203 lb (92.1 kg)   BMI 30.87 kg/m     General appearance: alert, cooperative and appears stated age   Abdomen:  Pfannenstiel incision intact.    Pelvic: External genitalia:  SP incisions intact.  Urethra:  normal appearing urethra with no masses, tenderness or lesions              Bartholins and Skenes: normal                 Vagina:  Sutures intact on anterior and posterior vagina.  No masses noted.  Sling protected.  Some flecks of Ethibond suture visible.              Cervix:  Absent.                Bimanual Exam:  Uterus:  Absent.               Adnexa: no mass, fullness, tenderness           Chaperone was present for exam.  ASSESSMENT  Status post abdominal sacrocolpopexy, anterior and posterior colporrhaphy, TVT Exact midurethral sling, cystoscopy.  Vaginal odor.  Vaginal lumps are consistent with suture material.  Constipation.  PLAN  Reassurance regarding post op healing.  Stop iron.  Eat foods rich in iron. Flagyl 500 mg po bid x 7 days.  Follow up for 6 week post op visit and prn. Will likely start vaginal estrogen cream at that time.     An After Visit Summary was printed and given to the patient.

## 2017-06-29 NOTE — Telephone Encounter (Signed)
Called patient at [phone] and per DPR, left detailed message stating Dr.Silva would like to see patient. Made appointment for today at 1:45pm arrive at 1:30 to register. Advised patient to call me back if not able to keep appointment.

## 2017-06-29 NOTE — Telephone Encounter (Signed)
My Chart Message  Dr. Quincy Simmonds,    I am about 2 1/2 weeks post op and am noticing some very hard lumps on the inside of my vagina. There is no pain but the lumps are hard and prominent. there are also some white small nodules on these lumps. Is this something to be concerned about?     Thank you,    Cynthia [name]

## 2017-07-21 ENCOUNTER — Encounter: Payer: Self-pay | Admitting: Obstetrics and Gynecology

## 2017-07-21 ENCOUNTER — Ambulatory Visit (INDEPENDENT_AMBULATORY_CARE_PROVIDER_SITE_OTHER): Payer: BC Managed Care – PPO | Admitting: Obstetrics and Gynecology

## 2017-07-21 VITALS — BP 110/70 | HR 50 | Ht 67.5 in | Wt 201.4 lb

## 2017-07-21 DIAGNOSIS — T83711A Erosion of implanted vaginal mesh and other prosthetic materials to surrounding organ or tissue, initial encounter: Secondary | ICD-10-CM

## 2017-07-21 DIAGNOSIS — D649 Anemia, unspecified: Secondary | ICD-10-CM

## 2017-07-21 DIAGNOSIS — Z9889 Other specified postprocedural states: Secondary | ICD-10-CM

## 2017-07-21 MED ORDER — ESTROGENS, CONJUGATED 0.625 MG/GM VA CREA: TOPICAL_CREAM | VAGINAL | 1 refills | Status: DC

## 2017-07-21 NOTE — Progress Notes (Signed)
GYNECOLOGY  VISIT   HPI: 61 y.o.   Married  Caucasian  female   G3P3 with No LMP recorded. Patient has had a hysterectomy.   here for 6 week follow up ABDOMINO SACROCOLPOPEXY with Halbans culdoplasty (N/A Abdomen) ANTERIOR (CYSTOCELE) AND POSTERIOR REPAIR (RECTOCELE) (N/A Vagina ) TRANSVAGINAL TAPE (TVT)PROCEDURE (N/A Vagina110/70  CYSTOSCOPY (N/A Bladder) BILATERAL SALPINGECTOMY (Bilateral Abdomen).  Patient having difficulty with bowel movements. Having BM every 48 hours but having abdominal pain and bloating prior to BM.    Anemia post op.  Hgb 10.3 06/19/17.  Not taking iron.   Using Miralax and stool softeners.  BMs are different now. Not splinting anymore, so happy about that.  Not up at night to void now.  Voiding well.  No leakage of urine.   Vagina odor is gone.     GYNECOLOGIC HISTORY: No LMP recorded. Patient has had a hysterectomy. Contraception:  Hysterectomy Menopausal hormone therapy:  none Last mammogram: 01-16-17 Density B/Neg/BiRads1:TBC Last pap smear: Years ago        OB History    Gravida Para Term Preterm AB Living   3 3       3    SAB TAB Ectopic Multiple Live Births           3         Patient Active Problem List   Diagnosis Date Noted  . Status post laparotomy 06/13/2017  . MVP (mitral valve prolapse) 03/14/2017  . Family history of aortic aneurysm 03/14/2017  . Dizziness 10/11/2016  . Osteoarthritis 05/25/2016  . Elevated hemoglobin A1c 05/25/2016  . BMI 34.0-34.9,adult 05/25/2016  . Preoperative cardiovascular examination 05/12/2015  . Low T4 05/12/2015  . Vitamin D deficiency 05/12/2015  . Prediabetes 05/12/2015  . History of colon polyps 05/27/2014  . Routine general medical examination at a health care facility 05/27/2014  . Vasomotor flushing 03/17/2014  . Personal history of cardiac arrhythmia 03/17/2014    Past Medical History:  Diagnosis Date  . Arthritis   . Cancer ([place])    skin on nose  . Cervical disc disease   .  Cystocele   . Heart murmur   . History of adenomatous polyp of colon 06/24/15  . History of rheumatic fever    childhood  . Hypothyroidism   . 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 surveillance  . Mitral valve prolapse   . PONV (postoperative nausea and vomiting)   . Primary osteoarthritis of right hip    Intraarticular steroid injection by Dr. Nelva Bush 01/2016  . Rectocele   . Rectocele   . SA node dysfunction [facility])     Past Surgical History:  Procedure Laterality Date  . ABDOMINAL HYSTERECTOMY  05/1996   TVH--ovaries remain  . ABDOMINAL SACROCOLPOPEXY N/A 06/13/2017   Procedure: ABDOMINO SACROCOLPOPEXY with Halbans culdoplasty;  Surgeon: Nunzio Cobbs, MD;  Location: [facility];  Service: Gynecology;  Laterality: N/A;  . ANTERIOR AND POSTERIOR REPAIR N/A 06/13/2017   Procedure: ANTERIOR (CYSTOCELE) AND POSTERIOR REPAIR (RECTOCELE);  Surgeon: Nunzio Cobbs, MD;  Location: [facility];  Service: Gynecology;  Laterality: N/A;  . arthroscopic knee Right   . BACK SURGERY  2012   disc rupture  . BILATERAL SALPINGECTOMY Bilateral 06/13/2017   Procedure: BILATERAL SALPINGECTOMY;  Surgeon: Nunzio Cobbs, MD;  Location: [facility];  Service: Gynecology;  Laterality: Bilateral;  . BLADDER SUSPENSION N/A 06/13/2017   Procedure: TRANSVAGINAL TAPE (TVT) PROCEDURE;  Surgeon: Nunzio Cobbs, MD;  Location: [facility];  Service: Gynecology;  Laterality: N/A;  . COLONOSCOPY W/ POLYPECTOMY  06/24/15   Recall 5 yrs (Dr. [facility])  . CYSTOSCOPY N/A 06/13/2017   Procedure: CYSTOSCOPY;  Surgeon: Nunzio Cobbs, MD;  Location: [facility];  Service: Gynecology;  Laterality: N/A;  . KNEE RECONSTRUCTION Right 2008  . LIVER BIOPSY  1997   fatty tumors    Current Outpatient Prescriptions  Medication Sig Dispense Refill  . aspirin 81 MG tablet Take 81 mg by mouth daily.    . Calcium Carb-Cholecalciferol (CALCIUM 600+D3)  600-800 MG-UNIT TABS Take 1 tablet by mouth daily.    Marland Kitchen docusate sodium (COLACE) 100 MG capsule Take 1 capsule (100 mg total) by mouth daily. 10 capsule 0  . escitalopram (LEXAPRO) 10 MG tablet Take 1 tablet (10 mg total) by mouth daily. 90 tablet 0  . levothyroxine (LEVOTHROID) 25 MCG tablet Take 1 tablet (25 mcg total) by mouth daily before breakfast. 90 tablet 0  . polyethylene glycol powder (GLYCOLAX/MIRALAX) powder Take 17 g by mouth daily.     . Ferrous Sulfate (IRON) 325 (65 Fe) MG TABS Take 1 tablet by mouth daily.     No current facility-administered medications for this visit.      ALLERGIES: Patient has no known allergies.  Family History  Problem Relation Age of Onset  . Alcohol abuse Mother   . CVA Mother        Dec 65  . Heart disease Mother   . Cancer Father        prostate  . Arthritis Father   . Heart murmur Son   . Alcohol abuse Sister   . Heart disease Brother        congenital heart defect--Dec age 43 from MI  . Heart murmur Son   . Heart murmur Son   . Cancer Maternal Aunt 50       Dec colon CA age 34  . Cancer Cousin 18       Dec colon CA age 38    Social History   Social History  . Marital status: Married    Spouse name: [name]  . Number of children: 3  . Years of education: N/A   Occupational History  . [employer]   Social History Main Topics  . Smoking status: Never Smoker  . Smokeless tobacco: Never Used  . Alcohol use 2.4 oz/week    4 Glasses of wine per week  . Drug use: No  . Sexual activity: No     Comment: Hyst   Other Topics Concern  . Not on file   Social History Narrative   Ms. Helget lives in [place] with her husband.  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 She is from [place].    ROS:  Pertinent items are noted in HPI.  PHYSICAL EXAMINATION:    BP 110/70 (BP Location: Right Arm, Patient Position: Sitting, Cuff Size: Large)    Pulse (!) 50   Ht 5' 7.5" (1.715 m)   Wt 201 lb 6.4 oz (91.4 kg)   BMI 31.08 kg/m     General appearance: alert, cooperative and appears stated age   Abdomen: Pfannenstiel incision intact, soft, non-tender, no masses,  no organomegaly    Pelvic: External genitalia:  SP incisions intact.  Urethra:  normal appearing urethra with no masses, tenderness or lesions              Bartholins and Skenes: normal                 Vagina: normal appearing vagina with normal color and discharge, no lesions.  1 cm area of mesh exposure of the anterior vaginal wall with permanent suture visible.  Nontender.  Bleeds slightly with speculum exam and Q tip palpation.  Suture still present vaginally.   Midurethral sling protected.               Cervix:   absent.                 Bimanual Exam:  Uterus:   Absent.               Adnexa: no mass, fullness, tenderness            Chaperone was present for exam.  ASSESSMENT   Mesh erosion following sacrocolpopexy.  Constipation symptoms.  Post op anemia.  Off Fe.  PLAN  I discussed the mesh erosion complication with the patient.  I recommend Premarin cream 1/2 gm pv at hs x 2 weeks and then 1/2 gm pv twice weekly to stimulate tissue ingrowth to try to cover the mesh.  I told her that I may still need to clip some of the fibers vaginally after we have maximum healing from the vaginal estrogen cream and final post op healing.  Stop Miralax and stool softeners, and try Metamucil. Folllow up about Sept 17 for recheck. Will check CBC today.   An After Visit Summary was printed and given to the patient.

## 2017-07-22 LAB — CBC
HEMATOCRIT: 36.3 % (ref 34.0–46.6)
HEMOGLOBIN: 12.2 g/dL (ref 11.1–15.9)
MCH: 29.4 pg (ref 26.6–33.0)
MCHC: 33.6 g/dL (ref 31.5–35.7)
MCV: 88 fL (ref 79–97)
Platelets: 278 10*3/uL (ref 150–379)
RBC: 4.15 x10E6/uL (ref 3.77–5.28)
RDW: 15 % (ref 12.3–15.4)
WBC: 8 10*3/uL (ref 3.4–10.8)

## 2017-07-25 ENCOUNTER — Telehealth: Payer: Self-pay | Admitting: *Deleted

## 2017-07-25 NOTE — Telephone Encounter (Signed)
Request for refill on patient thyroid medication received from pharmacy. Patient had recent thyroid testing at GYN office and recommendation to stay on current dose we have not seen patient for this since last June. Her last refill sent by [place] was in May she was due for CPE in end of June.Looks like GYN is managing this. Do you want to refill?

## 2017-07-26 ENCOUNTER — Encounter: Payer: Self-pay | Admitting: *Deleted

## 2017-07-26 MED ORDER — LEVOTHYROXINE SODIUM 25 MCG PO TABS: 25.0000 ug | ORAL_TABLET | Freq: Every day | ORAL | 0 refills | Status: DC

## 2017-07-26 NOTE — Telephone Encounter (Signed)
Message sent in MY Chart 

## 2017-07-26 NOTE — Telephone Encounter (Signed)
Please call pt: -  Typically this is refilled during a CPE when thyroid is tested. I have refilled her thyroid medication for 90d. She is overdue for her CPE (last completed 04/2016). Please have her schedule her physical within that time period, so she will not run out of medication. No additional refills will be granted.

## 2017-08-15 ENCOUNTER — Ambulatory Visit (INDEPENDENT_AMBULATORY_CARE_PROVIDER_SITE_OTHER): Payer: BC Managed Care – PPO | Admitting: Obstetrics and Gynecology

## 2017-08-15 VITALS — BP 120/66 | HR 68 | Resp 16 | Ht 67.5 in | Wt 200.0 lb

## 2017-08-15 DIAGNOSIS — T83711D Erosion of implanted vaginal mesh and other prosthetic materials to surrounding organ or tissue, subsequent encounter: Secondary | ICD-10-CM

## 2017-08-15 NOTE — Progress Notes (Signed)
GYNECOLOGY  VISIT   HPI: 61 y.o.   Married  Caucasian  female   G3P3 with No LMP recorded. Patient has had a hysterectomy.   here for  3 week follow-up ABDOMINO SACROCOLPOPEXY with Halbans culdoplasty (N/A Abdomen) ANTERIOR (CYSTOCELE) AND POSTERIOR REPAIR (RECTOCELE) (N/A Vagina ) TRANSVAGINAL TAPE (TVT)PROCEDURE (N/A Vagina110/70     CYSTOSCOPY (N/A Bladder) BILATERAL SALPINGECTOMY (Bilateral Abdomen).  Has vaginal vault erosion of sacrocolpopexy mesh. Treating with vaginal estrogen cream.  Used for 2 weeks and now using twice weekly.  No vaginal bleeding.   BMs are not the same.  Some urge to have a BM and not wanting to do so.  This can last for 48 hours prior to elimination.   When she feels stool moving in her upper colon, she has pain like aching in the vagina.  Not having pain in the vagina all the time.   When she bears down to have a BM, she feels a click.   GYNECOLOGIC HISTORY: No LMP recorded. Patient has had a hysterectomy. Contraception:  Hysterectomy  Menopausal hormone therapy:  Premarin vaginal cream Last mammogram:  01-17-17 Density B/Neg/BiRads1:TBC Last pap smear:   Years ago        OB History    Gravida Para Term Preterm AB Living   3 3       3    SAB TAB Ectopic Multiple Live Births           3         Patient Active Problem List   Diagnosis Date Noted  . Status post laparotomy 06/13/2017  . MVP (mitral valve prolapse) 03/14/2017  . Family history of aortic aneurysm 03/14/2017  . Dizziness 10/11/2016  . Osteoarthritis 05/25/2016  . Elevated hemoglobin A1c 05/25/2016  . BMI 34.0-34.9,adult 05/25/2016  . Preoperative cardiovascular examination 05/12/2015  . Low T4 05/12/2015  . Vitamin D deficiency 05/12/2015  . Prediabetes 05/12/2015  . History of colon polyps 05/27/2014  . Routine general medical examination at a health care facility 05/27/2014  . Vasomotor flushing 03/17/2014  . Personal history of cardiac arrhythmia 03/17/2014    Past  Medical History:  Diagnosis Date  . Arthritis   . Cancer ([place])    skin on nose  . Cervical disc disease   . Cystocele   . Heart murmur   . History of adenomatous polyp of colon 06/24/15  . History of rheumatic fever    childhood  . Hypothyroidism   . 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 surveillance  . Mitral valve prolapse   . PONV (postoperative nausea and vomiting)   . Primary osteoarthritis of right hip    Intraarticular steroid injection by Dr. Nelva Bush 01/2016  . Rectocele   . Rectocele   . SA node dysfunction [facility])     Past Surgical History:  Procedure Laterality Date  . ABDOMINAL HYSTERECTOMY  05/1996   TVH--ovaries remain  . ABDOMINAL SACROCOLPOPEXY N/A 06/13/2017   Procedure: ABDOMINO SACROCOLPOPEXY with Halbans culdoplasty;  Surgeon: Nunzio Cobbs, MD;  Location: [facility];  Service: Gynecology;  Laterality: N/A;  . ANTERIOR AND POSTERIOR REPAIR N/A 06/13/2017   Procedure: ANTERIOR (CYSTOCELE) AND POSTERIOR REPAIR (RECTOCELE);  Surgeon: Nunzio Cobbs, MD;  Location: [facility];  Service: Gynecology;  Laterality: N/A;  . arthroscopic knee Right   . BACK SURGERY  2012   disc rupture  . BILATERAL SALPINGECTOMY Bilateral 06/13/2017   Procedure: BILATERAL SALPINGECTOMY;  Surgeon: Nunzio Cobbs, MD;  Location: [facility];  Service: Gynecology;  Laterality: Bilateral;  . BLADDER SUSPENSION N/A 06/13/2017   Procedure: TRANSVAGINAL TAPE (TVT) PROCEDURE;  Surgeon: Nunzio Cobbs, MD;  Location: [facility];  Service: Gynecology;  Laterality: N/A;  . COLONOSCOPY W/ POLYPECTOMY  06/24/15   Recall 5 yrs ([facility])  . CYSTOSCOPY N/A 06/13/2017   Procedure: CYSTOSCOPY;  Surgeon: Nunzio Cobbs, MD;  Location: [facility];  Service: Gynecology;  Laterality: N/A;  . KNEE RECONSTRUCTION Right 2008  . LIVER BIOPSY  1997   fatty tumors    Current Outpatient Prescriptions  Medication Sig Dispense  Refill  . aspirin 81 MG tablet Take 81 mg by mouth daily.    . Calcium Carb-Cholecalciferol (CALCIUM 600+D3) 600-800 MG-UNIT TABS Take 1 tablet by mouth daily.    Marland Kitchen conjugated estrogens (PREMARIN) vaginal cream Use 1/2 g vaginally every night at bed time for 2 weeks, then use 1/2 g vaginally two times per week. 30 g 1  . docusate sodium (COLACE) 100 MG capsule Take 1 capsule (100 mg total) by mouth daily. 10 capsule 0  . escitalopram (LEXAPRO) 10 MG tablet Take 1 tablet (10 mg total) by mouth daily. 90 tablet 0  . levothyroxine (LEVOTHROID) 25 MCG tablet Take 1 tablet (25 mcg total) by mouth daily before breakfast. 90 tablet 0  . polyethylene glycol powder (GLYCOLAX/MIRALAX) powder Take 17 g by mouth daily.      No current facility-administered medications for this visit.      ALLERGIES: Patient has no known allergies.  Family History  Problem Relation Age of Onset  . Alcohol abuse Mother   . CVA Mother        Dec 65  . Heart disease Mother   . Cancer Father        prostate  . Arthritis Father   . Heart murmur Son   . Alcohol abuse Sister   . Heart disease Brother        congenital heart defect--Dec age 60 from MI  . Heart murmur Son   . Heart murmur Son   . Cancer Maternal Aunt 50       Dec colon CA age 66  . Cancer Cousin 68       Dec colon CA age 75    Social History   Social History  . Marital status: Married    Spouse name: [name]  . Number of children: 3  . Years of education: N/A   Occupational History  . [employer]   Social History Main Topics  . Smoking status: Never Smoker  . Smokeless tobacco: Never Used  . Alcohol use 2.4 oz/week    4 Glasses of wine per week  . Drug use: No  . Sexual activity: No     Comment: Hyst   Other Topics Concern  . Not on file   Social History Narrative   Ms. Mazurkiewicz lives in [place] with her husband.  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 She has 3  grown sons. She is from [place].    ROS:  Pertinent items are noted in HPI.  PHYSICAL EXAMINATION:    BP 120/66 (BP Location: Right Arm, Patient Position: Sitting, Cuff Size: Large)   Pulse 68   Resp 16   Ht 5' 7.5" (1.715 m)   Wt 200 lb (90.7 kg)  BMI 30.86 kg/m     General appearance: alert, cooperative and appears stated age   Abdomen: incision intact, soft, non-tender, no masses,  no organomegaly    Pelvic: External genitalia:  no lesions              Urethra:  normal appearing urethra with no masses, tenderness or lesions              Bartholins and Skenes: normal                 Vagina:  7 - 8 mm exposure of mesh at posterior vaginal apex.  2 Ethibond sutures noted.  No erythema of the vaginal mucosa.  No drainage.  Good support of the vaginal vault, good caliber, good length.              Cervix:  Absent.                 Bimanual Exam:  Uterus:   Absent.               Adnexa: no mass, fullness, tenderness                Chaperone was present for exam.  ASSESSMENT  Vaginal vault mesh erosion.  Improved with vaginal estrogen cream.  Constipation.   PLAN  We discussed mesh erosions, risk factors for this, and possible dyspareunia. Will continue to treat with vaginal estrogen 1/2 gram twice weekly.  I discussed the possibility of clipping any remaining mesh exposure at that time.  Continue pelvic rest and no lifting over 10 pounds. Use probiotics, Colace, or Miralax to help regulate bowel movements.  Return for final 3 month post op visit.    An After Visit Summary was printed and given to the patient.

## 2017-08-16 ENCOUNTER — Ambulatory Visit (INDEPENDENT_AMBULATORY_CARE_PROVIDER_SITE_OTHER): Payer: BC Managed Care – PPO | Admitting: Family Medicine

## 2017-08-16 ENCOUNTER — Encounter: Payer: Self-pay | Admitting: Obstetrics and Gynecology

## 2017-08-16 ENCOUNTER — Encounter: Payer: Self-pay | Admitting: Family Medicine

## 2017-08-16 ENCOUNTER — Other Ambulatory Visit: Payer: Self-pay | Admitting: Family Medicine

## 2017-08-16 VITALS — BP 92/64 | HR 55 | Temp 98.3°F | Resp 20 | Ht 68.0 in | Wt 199.8 lb

## 2017-08-16 DIAGNOSIS — R232 Flushing: Secondary | ICD-10-CM

## 2017-08-16 DIAGNOSIS — Z0001 Encounter for general adult medical examination with abnormal findings: Secondary | ICD-10-CM | POA: Diagnosis not present

## 2017-08-16 DIAGNOSIS — E038 Other specified hypothyroidism: Secondary | ICD-10-CM

## 2017-08-16 DIAGNOSIS — E559 Vitamin D deficiency, unspecified: Secondary | ICD-10-CM

## 2017-08-16 DIAGNOSIS — R7303 Prediabetes: Secondary | ICD-10-CM | POA: Diagnosis not present

## 2017-08-16 DIAGNOSIS — R7309 Other abnormal glucose: Secondary | ICD-10-CM | POA: Diagnosis not present

## 2017-08-16 DIAGNOSIS — Z6834 Body mass index (BMI) 34.0-34.9, adult: Secondary | ICD-10-CM | POA: Diagnosis not present

## 2017-08-16 DIAGNOSIS — Z8679 Personal history of other diseases of the circulatory system: Secondary | ICD-10-CM

## 2017-08-16 LAB — LIPID PANEL
CHOLESTEROL: 181 mg/dL (ref 0–200)
HDL: 72.1 mg/dL (ref >39.00)
LDL CALC: 85 mg/dL (ref 0–99)
NonHDL: 108.98
Total CHOL/HDL Ratio: 3
Triglycerides: 122 mg/dL (ref 0.0–149.0)
VLDL: 24.4 mg/dL (ref 0.0–40.0)

## 2017-08-16 LAB — CBC WITH DIFFERENTIAL/PLATELET
BASOS PCT: 0.9 % (ref 0.0–3.0)
Basophils Absolute: 0.1 10*3/uL (ref 0.0–0.1)
EOS ABS: 0.3 10*3/uL (ref 0.0–0.7)
Eosinophils Relative: 5.2 % — ABNORMAL HIGH (ref 0.0–5.0)
HEMATOCRIT: 40.1 % (ref 36.0–46.0)
HEMOGLOBIN: 12.9 g/dL (ref 12.0–15.0)
LYMPHS PCT: 38.5 % (ref 12.0–46.0)
Lymphs Abs: 2.3 10*3/uL (ref 0.7–4.0)
MCHC: 32.3 g/dL (ref 30.0–36.0)
MCV: 91.4 fl (ref 78.0–100.0)
Monocytes Absolute: 0.4 10*3/uL (ref 0.1–1.0)
Monocytes Relative: 6.8 % (ref 3.0–12.0)
Neutro Abs: 2.9 10*3/uL (ref 1.4–7.7)
Neutrophils Relative %: 48.6 % (ref 43.0–77.0)
Platelets: 272 10*3/uL (ref 150.0–400.0)
RBC: 4.39 Mil/uL (ref 3.87–5.11)
RDW: 14.7 % (ref 11.5–15.5)
WBC: 5.9 10*3/uL (ref 4.0–10.5)

## 2017-08-16 LAB — COMPREHENSIVE METABOLIC PANEL
ALBUMIN: 4.2 g/dL (ref 3.5–5.2)
ALK PHOS: 97 U/L (ref 39–117)
ALT: 12 U/L (ref 0–35)
AST: 15 U/L (ref 0–37)
BUN: 14 mg/dL (ref 6–23)
CALCIUM: 9.4 mg/dL (ref 8.4–10.5)
CHLORIDE: 107 meq/L (ref 96–112)
CO2: 26 mEq/L (ref 19–32)
CREATININE: 0.73 mg/dL (ref 0.40–1.20)
GFR: 86.19 mL/min (ref >60.00)
Glucose, Bld: 99 mg/dL (ref 70–99)
POTASSIUM: 4.4 meq/L (ref 3.5–5.1)
Sodium: 140 mEq/L (ref 135–145)
TOTAL PROTEIN: 6.5 g/dL (ref 6.0–8.3)
Total Bilirubin: 0.4 mg/dL (ref 0.2–1.2)

## 2017-08-16 LAB — VITAMIN D 25 HYDROXY (VIT D DEFICIENCY, FRACTURES): VITD: 24.66 ng/mL — AB (ref 30.00–100.00)

## 2017-08-16 LAB — HEMOGLOBIN A1C: Hgb A1c MFr Bld: 5.3 % (ref 4.6–6.5)

## 2017-08-16 MED ORDER — VITAMIN D (ERGOCALCIFEROL) 1.25 MG (50000 UNIT) PO CAPS: 50000.0000 [IU] | ORAL_CAPSULE | ORAL | 0 refills | Status: DC

## 2017-08-16 MED ORDER — ESCITALOPRAM OXALATE 10 MG PO TABS: 10.0000 mg | ORAL_TABLET | Freq: Every day | ORAL | 3 refills | Status: DC

## 2017-08-16 MED ORDER — LEVOTHYROXINE SODIUM 25 MCG PO TABS: 25.0000 ug | ORAL_TABLET | Freq: Every day | ORAL | 3 refills | Status: DC

## 2017-08-16 NOTE — Progress Notes (Signed)
Vit d prescribed.

## 2017-08-16 NOTE — Progress Notes (Signed)
Patient ID: Cynthia [name], female  DOB: 08/03/1956, 61 y.o.   MRN: [id number] Patient Care Team    Relationship Specialty Notifications Start End  Ma Hillock, DO PCP - General Family Medicine  07/14/15   Rosana Berger, MD Consulting Physician Gastroenterology  06/29/15   Melina Schools, MD Consulting Physician Orthopedic Surgery  04/13/16     Chief Complaint  Patient presents with  . Annual Exam    Subjective:  ISADORE [name] is a 61 y.o.  Female  present for CPE. All past medical history, surgical history, allergies, family history, immunizations, medications and social history were updated in the electronic medical record today. All recent labs, ED visits and hospitalizations within the last year were reviewed.  Health maintenance:  Colonoscopy: UTD 2016, Dr. Glennon Hamilton ([facility] Polyps present. Pt states 5 year f/u. Mammogram: Patient will like to be screened every 2 years, last mammogram 05/19/2015. BI-RADS 1. No family history present. Cervical cancer screening: Hysterectomy, Pap smear not indicated. Has GYN for pelvics. Immunizations: Tetanus completed 2009, influenza encouraged yearly. Infectious disease screening: HIV completed, hepatitis C screening indicated. DEXA: discuss next year to completed with mammogram.  Assistive device: None Oxygen use: None Patient has a Dental home. Hospitalizations/ED visits: reviewed, recent surgery  Depression screen [facility] 2/9 08/16/2017 05/25/2016  Decreased Interest 0 0  Down, Depressed, Hopeless 0 0  PHQ - 2 Score 0 0   No flowsheet data found.   Current Exercise Habits: The patient does not participate in regular exercise at present Exercise limited by: Other - see comments (recent surgery)   Immunization History  Administered Date(s) Administered  . Influenza-Unspecified 07/21/2017  . Tdap 02/07/2008     Past Medical History:  Diagnosis Date  . Arthritis   . Cancer ([place])    skin on nose    . Cervical disc disease   . Cystocele   . Heart murmur   . History of adenomatous polyp of colon 06/24/15  . History of rheumatic fever    childhood  . Hypothyroidism   . 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 surveillance  . Mitral valve prolapse   . PONV (postoperative nausea and vomiting)   . Primary osteoarthritis of right hip    Intraarticular steroid injection by Dr. Nelva Bush 01/2016  . Rectocele   . Rectocele   . SA node dysfunction ([facility])    No Known Allergies Past Surgical History:  Procedure Laterality Date  . ABDOMINAL HYSTERECTOMY  05/1996   TVH--ovaries remain  . ABDOMINAL SACROCOLPOPEXY N/A 06/13/2017   Procedure: ABDOMINO SACROCOLPOPEXY with Halbans culdoplasty;  Surgeon: Nunzio Cobbs, MD;  Location: [facility];  Service: Gynecology;  Laterality: N/A;  . ANTERIOR AND POSTERIOR REPAIR N/A 06/13/2017   Procedure: ANTERIOR (CYSTOCELE) AND POSTERIOR REPAIR (RECTOCELE);  Surgeon: Nunzio Cobbs, MD;  Location: [facility];  Service: Gynecology;  Laterality: N/A;  . arthroscopic knee Right   . BACK SURGERY  2012   disc rupture  . BILATERAL SALPINGECTOMY Bilateral 06/13/2017   Procedure: BILATERAL SALPINGECTOMY;  Surgeon: Nunzio Cobbs, MD;  Location: [facility];  Service: Gynecology;  Laterality: Bilateral;  . BLADDER SUSPENSION N/A 06/13/2017   Procedure: TRANSVAGINAL TAPE (TVT) PROCEDURE;  Surgeon: Nunzio Cobbs, MD;  Location: [facility];  Service: Gynecology;  Laterality: N/A;  . COLONOSCOPY W/ POLYPECTOMY  06/24/15   Recall 5 yrs ([facility])  .  CYSTOSCOPY N/A 06/13/2017   Procedure: CYSTOSCOPY;  Surgeon: Nunzio Cobbs, MD;  Location: [facility];  Service: Gynecology;  Laterality: N/A;  . KNEE RECONSTRUCTION Right 2008  . LIVER BIOPSY  1997   fatty tumors   Family History  Problem Relation Age of Onset  . Alcohol abuse Mother   . CVA Mother        Dec 65  . Heart disease Mother    . Cancer Father        prostate  . Arthritis Father   . Heart murmur Son   . Alcohol abuse Sister   . Heart disease Brother        congenital heart defect--Dec age 28 from MI  . Heart murmur Son   . Heart murmur Son   . Cancer Maternal Aunt 50       Dec colon CA age 68  . Cancer Cousin 53       Dec colon CA age 68   Social History   Social History  . Marital status: Married    Spouse name: [name]  . Number of children: 3  . Years of education: N/A   Occupational History  . [employer]   Social History Main Topics  . Smoking status: Never Smoker  . Smokeless tobacco: Never Used  . Alcohol use 2.4 oz/week    4 Glasses of wine per week  . Drug use: No  . Sexual activity: No     Comment: Hyst   Other Topics Concern  . Not on file   Social History Narrative   Cynthia [name] lives in [place] with her husband.  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 She is from [place].   Allergies as of 08/16/2017   No Known Allergies     Medication List       Accurate as of 08/16/17  9:23 AM. Always use your most recent med list.          aspirin 81 MG tablet Take 81 mg by mouth daily.   CALCIUM 600+D3 600-800 MG-UNIT Tabs Generic drug:  Calcium Carb-Cholecalciferol Take 1 tablet by mouth daily.   conjugated estrogens vaginal cream Commonly known as:  PREMARIN Use 1/2 g vaginally every night at bed time for 2 weeks, then use 1/2 g vaginally two times per week.   escitalopram 10 MG tablet Commonly known as:  LEXAPRO Take 1 tablet (10 mg total) by mouth daily.   levothyroxine 25 MCG tablet Commonly known as:  LEVOTHROID Take 1 tablet (25 mcg total) by mouth daily before breakfast.   polyethylene glycol powder powder Commonly known as:  GLYCOLAX/MIRALAX Take 17 g by mouth daily.            Discharge Care Instructions        Start     Ordered   08/16/17 0000  Lipid  panel     08/16/17 0906   08/16/17 0000  Comp Met (CMET)     08/16/17 0906   08/16/17 0000  CBC w/Diff     08/16/17 0906   08/16/17 0000  HgB A1c     08/16/17 0906   08/16/17 0000  Vitamin D (25 hydroxy)     08/16/17 0906   08/16/17 0000  levothyroxine (LEVOTHROID) 25 MCG tablet  Daily before breakfast    Comments:  Hold until pt request   08/16/17 0908  08/16/17 0000  escitalopram (LEXAPRO) 10 MG tablet  Daily    Comments:  Needs office visit prior to anymore refills.   08/16/17 0909      All past medical history, surgical history, allergies, family history, immunizations andmedications were updated in the EMR today and reviewed under the history and medication portions of their EMR.     Recent Results (from the past 2160 hour(s))  Thyroid Panel With TSH     Status: None   Collection Time: 05/24/17  3:28 PM  Result Value Ref Range   TSH 2.830 0.450 - 4.500 uIU/mL   T4, Total 6.2 4.5 - 12.0 ug/dL   T3 Uptake Ratio 25 24 - 39 %   Free Thyroxine Index 1.6 1.2 - 4.9  Basic metabolic panel     Status: None   Collection Time: 06/05/17 10:00 AM  Result Value Ref Range   Sodium 138 135 - 145 mmol/L   Potassium 4.1 3.5 - 5.1 mmol/L   Chloride 106 101 - 111 mmol/L   CO2 24 22 - 32 mmol/L   Glucose, Bld 95 65 - 99 mg/dL   BUN 15 6 - 20 mg/dL   Creatinine, Ser 0.68 0.44 - 1.00 mg/dL   Calcium 9.2 8.9 - 10.3 mg/dL   GFR calc non Af Amer >60 >60 mL/min   GFR calc Af Amer >60 >60 mL/min    Comment: (NOTE) The eGFR has been calculated using the CKD EPI equation. This calculation has not been validated in all clinical situations. eGFR's persistently <60 mL/min signify possible Chronic Kidney Disease.    Anion gap 8 5 - 15  CBC     Status: None   Collection Time: 06/05/17 10:00 AM  Result Value Ref Range   WBC 6.8 4.0 - 10.5 K/uL   RBC 4.21 3.87 - 5.11 MIL/uL   Hemoglobin 12.6 12.0 - 15.0 g/dL   HCT 38.3 36.0 - 46.0 %   MCV 91.0 78.0 - 100.0 fL   MCH 29.9 26.0 - 34.0 pg    MCHC 32.9 30.0 - 36.0 g/dL   RDW 14.1 11.5 - 15.5 %   Platelets 257 150 - 400 K/uL  CBC     Status: Abnormal   Collection Time: 06/14/17  5:12 AM  Result Value Ref Range   WBC 11.1 (H) 4.0 - 10.5 K/uL   RBC 3.21 (L) 3.87 - 5.11 MIL/uL   Hemoglobin 9.7 (L) 12.0 - 15.0 g/dL   HCT 29.5 (L) 36.0 - 46.0 %   MCV 91.9 78.0 - 100.0 fL   MCH 30.2 26.0 - 34.0 pg   MCHC 32.9 30.0 - 36.0 g/dL   RDW 14.4 11.5 - 15.5 %   Platelets 183 150 - 400 K/uL  Basic metabolic panel     Status: Abnormal   Collection Time: 06/14/17  5:12 AM  Result Value Ref Range   Sodium 138 135 - 145 mmol/L   Potassium 4.0 3.5 - 5.1 mmol/L   Chloride 106 101 - 111 mmol/L   CO2 28 22 - 32 mmol/L   Glucose, Bld 111 (H) 65 - 99 mg/dL   BUN 10 6 - 20 mg/dL   Creatinine, Ser 0.62 0.44 - 1.00 mg/dL   Calcium 8.2 (L) 8.9 - 10.3 mg/dL   GFR calc non Af Amer >60 >60 mL/min   GFR calc Af Amer >60 >60 mL/min    Comment: (NOTE) The eGFR has been calculated using the CKD EPI equation. This calculation has not been  validated in all clinical situations. eGFR's persistently <60 mL/min signify possible Chronic Kidney Disease.    Anion gap 4 (L) 5 - 15  CBC     Status: Abnormal   Collection Time: 06/15/17  5:23 AM  Result Value Ref Range   WBC 8.4 4.0 - 10.5 K/uL   RBC 2.96 (L) 3.87 - 5.11 MIL/uL   Hemoglobin 9.1 (L) 12.0 - 15.0 g/dL   HCT 27.4 (L) 36.0 - 46.0 %   MCV 92.6 78.0 - 100.0 fL   MCH 30.7 26.0 - 34.0 pg   MCHC 33.2 30.0 - 36.0 g/dL   RDW 14.5 11.5 - 15.5 %   Platelets 185 150 - 400 K/uL  CBC     Status: Abnormal   Collection Time: 06/19/17  1:24 PM  Result Value Ref Range   WBC 7.6 3.4 - 10.8 x10E3/uL   RBC 3.52 (L) 3.77 - 5.28 x10E6/uL   Hemoglobin 10.3 (L) 11.1 - 15.9 g/dL   Hematocrit 30.2 (L) 34.0 - 46.6 %   MCV 86 79 - 97 fL   MCH 29.3 26.6 - 33.0 pg   MCHC 34.1 31.5 - 35.7 g/dL   RDW 14.6 12.3 - 15.4 %   Platelets 317 150 - 379 x10E3/uL  CBC     Status: None   Collection Time: 07/21/17  4:22 PM   Result Value Ref Range   WBC 8.0 3.4 - 10.8 x10E3/uL   RBC 4.15 3.77 - 5.28 x10E6/uL   Hemoglobin 12.2 11.1 - 15.9 g/dL   Hematocrit 36.3 34.0 - 46.6 %   MCV 88 79 - 97 fL   MCH 29.4 26.6 - 33.0 pg   MCHC 33.6 31.5 - 35.7 g/dL   RDW 15.0 12.3 - 15.4 %   Platelets 278 150 - 379 x10E3/uL    No results found.   ROS: 14 pt review of systems performed and negative (unless mentioned in an HPI)  Objective: BP 92/64 (BP Location: Right Arm, Patient Position: Sitting, Cuff Size: Large)   Pulse (!) 55   Temp 98.3 F (36.8 C)   Resp 20   Ht _0  (1.727 m)   Wt 199 lb 12 oz (90.6 kg)   SpO2 98%   BMI 30.37 kg/m  Gen: Afebrile. No acute distress. Nontoxic in appearance, well-developed, well-nourished,  Pleasant caucasian female.  HENT: AT. [place]. Bilateral TM visualized and normal in appearance, normal external auditory canal. MMM, no oral lesions, adequate dentition. Bilateral nares within normal limits. Throat without erythema, ulcerations or exudates. no Cough on exam, no hoarseness on exam. Eyes:Pupils Equal Round Reactive to light, Extraocular movements intact,  Conjunctiva without redness, discharge or icterus. Neck/lymp/endocrine: Supple,no lymphadenopathy, no thyromegaly CV: RRR no murmur, non edema, +2/4 P posterior tibialis pulses. no carotid bruits. No JVD. Chest: CTAB, no wheeze, rhonchi or crackles. normal Respiratory effort. good Air movement. Abd: Soft. Obese. Well healing pfannenstiel scar.  NTND. BS present. no Masses palpated. No hepatosplenomegaly. No rebound tenderness or guarding. Skin: no rashes, purpura or petechiae. Warm and well-perfused. Skin intact. Neuro/Msk: Normal gait. PERLA. EOMi. Alert. Oriented x3.  Cranial nerves II through XII intact. Muscle strength 5/5 upper/lower extremity. DTRs equal bilaterally. Psych: Normal affect, dress and demeanor. Normal speech. Normal thought content and judgment.   No exam data present  Assessment/plan: SHAKIYA MCNEARY  is a 61 y.o. female present for CPE. BMI 34.0-34.9,adult - Lipid panel - HgB A1c Vitamin D deficiency - Vitamin D (25 hydroxy) Hypothyroidism/Personal history of cardiac  arrhythmia - refills on synthroid 25 mcg for 1 year. TSH in 04/2017 normal at  GYN.  - Lipid panel - Comp Met (CMET) - CBC w/Diff - HgB A1c  Encounter for general adult medical examination with abnormal findings Patient was encouraged to exercise greater than 150 minutes a week. Patient was encouraged to choose a diet filled with fresh fruits and vegetables, and lean meats. AVS provided to patient today for education/recommendation on gender specific health and safety maintenance. UTD immunizations. Td due next year (2019) Mammogram due 12/2017. Colonoscopy due 2021 ID screen completed.  Consider Dexa next year with mammogram (Estrogen deficient and Vit d deficient) Elevated hemoglobin A1c - HgB A1c Vasomotor flushing - Stable. refills on lexapro for 1 year - escitalopram (LEXAPRO) 10 MG tablet; Take 1 tablet (10 mg total) by mouth daily.  Dispense: 90 tablet; Refill: 3   Return in about 1 year (around 08/16/2018) for CPE.  Electronically signed by: Howard Pouch, DO [place]

## 2017-08-16 NOTE — Patient Instructions (Signed)
Health Maintenance, Female Adopting a healthy lifestyle and getting preventive care can go a long way to promote health and wellness. Talk with your health care provider about what schedule of regular examinations is right for you. This is a good chance for you to check in with your provider about disease prevention and staying healthy. In between checkups, there are plenty of things you can do on your own. Experts have done a lot of research about which lifestyle changes and preventive measures are most likely to keep you healthy. Ask your health care provider for more information. Weight and diet Eat a healthy diet  Be sure to include plenty of vegetables, fruits, low-fat dairy products, and lean protein.  Do not eat a lot of foods high in solid fats, added sugars, or salt.  Get regular exercise. This is one of the most important things you can do for your health. ? Most adults should exercise for at least 150 minutes each week. The exercise should increase your heart rate and make you sweat (moderate-intensity exercise). ? Most adults should also do strengthening exercises at least twice a week. This is in addition to the moderate-intensity exercise.  Maintain a healthy weight  Body mass index (BMI) is a measurement that can be used to identify possible weight problems. It estimates body fat based on height and weight. Your health care provider can help determine your BMI and help you achieve or maintain a healthy weight.  For females 20 years of age and older: ? A BMI below 18.5 is considered underweight. ? A BMI of 18.5 to 24.9 is normal. ? A BMI of 25 to 29.9 is considered overweight. ? A BMI of 30 and above is considered obese.  Watch levels of cholesterol and blood lipids  You should start having your blood tested for lipids and cholesterol at 61 years of age, then have this test every 5 years.  You may need to have your cholesterol levels checked more often if: ? Your lipid or  cholesterol levels are high. ? You are older than 61 years of age. ? You are at high risk for heart disease.  Cancer screening Lung Cancer  Lung cancer screening is recommended for adults 55-80 years old who are at high risk for lung cancer because of a history of smoking.  A yearly low-dose CT scan of the lungs is recommended for people who: ? Currently smoke. ? Have quit within the past 15 years. ? Have at least a 30-pack-year history of smoking. A pack year is smoking an average of one pack of cigarettes a day for 1 year.  Yearly screening should continue until it has been 15 years since you quit.  Yearly screening should stop if you develop a health problem that would prevent you from having lung cancer treatment.  Breast Cancer  Practice breast self-awareness. This means understanding how your breasts normally appear and feel.  It also means doing regular breast self-exams. Let your health care provider know about any changes, no matter how small.  If you are in your 20s or 30s, you should have a clinical breast exam (CBE) by a health care provider every 1-3 years as part of a regular health exam.  If you are 40 or older, have a CBE every year. Also consider having a breast X-ray (mammogram) every year.  If you have a family history of breast cancer, talk to your health care provider about genetic screening.  If you are at high risk   for breast cancer, talk to your health care provider about having an MRI and a mammogram every year.  Breast cancer gene (BRCA) assessment is recommended for women who have family members with BRCA-related cancers. BRCA-related cancers include: ? Breast. ? Ovarian. ? Tubal. ? Peritoneal cancers.  Results of the assessment will determine the need for genetic counseling and BRCA1 and BRCA2 testing.  Cervical Cancer Your health care provider may recommend that you be screened regularly for cancer of the pelvic organs (ovaries, uterus, and  vagina). This screening involves a pelvic examination, including checking for microscopic changes to the surface of your cervix (Pap test). You may be encouraged to have this screening done every 3 years, beginning at age 22.  For women ages 56-65, health care providers may recommend pelvic exams and Pap testing every 3 years, or they may recommend the Pap and pelvic exam, combined with testing for human papilloma virus (HPV), every 5 years. Some types of HPV increase your risk of cervical cancer. Testing for HPV may also be done on women of any age with unclear Pap test results.  Other health care providers may not recommend any screening for nonpregnant women who are considered low risk for pelvic cancer and who do not have symptoms. Ask your health care provider if a screening pelvic exam is right for you.  If you have had past treatment for cervical cancer or a condition that could lead to cancer, you need Pap tests and screening for cancer for at least 20 years after your treatment. If Pap tests have been discontinued, your risk factors (such as having a new sexual partner) need to be reassessed to determine if screening should resume. Some women have medical problems that increase the chance of getting cervical cancer. In these cases, your health care provider may recommend more frequent screening and Pap tests.  Colorectal Cancer  This type of cancer can be detected and often prevented.  Routine colorectal cancer screening usually begins at 61 years of age and continues through 61 years of age.  Your health care provider may recommend screening at an earlier age if you have risk factors for colon cancer.  Your health care provider may also recommend using home test kits to check for hidden blood in the stool.  A small camera at the end of a tube can be used to examine your colon directly (sigmoidoscopy or colonoscopy). This is done to check for the earliest forms of colorectal  cancer.  Routine screening usually begins at age 33.  Direct examination of the colon should be repeated every 5-10 years through 61 years of age. However, you may need to be screened more often if early forms of precancerous polyps or small growths are found.  Skin Cancer  Check your skin from head to toe regularly.  Tell your health care provider about any new moles or changes in moles, especially if there is a change in a mole's shape or color.  Also tell your health care provider if you have a mole that is larger than the size of a pencil eraser.  Always use sunscreen. Apply sunscreen liberally and repeatedly throughout the day.  Protect yourself by wearing long sleeves, pants, a wide-brimmed hat, and sunglasses whenever you are outside.  Heart disease, diabetes, and high blood pressure  High blood pressure causes heart disease and increases the risk of stroke. High blood pressure is more likely to develop in: ? People who have blood pressure in the high end of  the normal range (130-139/85-89 mm Hg). ? People who are overweight or obese. ? People who are African American.  If you are 21-29 years of age, have your blood pressure checked every 3-5 years. If you are 3 years of age or older, have your blood pressure checked every year. You should have your blood pressure measured twice-once when you are at a hospital or clinic, and once when you are not at a hospital or clinic. Record the average of the two measurements. To check your blood pressure when you are not at a hospital or clinic, you can use: ? An automated blood pressure machine at a pharmacy. ? A home blood pressure monitor.  If you are between 17 years and 37 years old, ask your health care provider if you should take aspirin to prevent strokes.  Have regular diabetes screenings. This involves taking a blood sample to check your fasting blood sugar level. ? If you are at a normal weight and have a low risk for diabetes,  have this test once every three years after 61 years of age. ? If you are overweight and have a high risk for diabetes, consider being tested at a younger age or more often. Preventing infection Hepatitis B  If you have a higher risk for hepatitis B, you should be screened for this virus. You are considered at high risk for hepatitis B if: ? You were born in a country where hepatitis B is common. Ask your health care provider which countries are considered high risk. ? Your parents were born in a high-risk country, and you have not been immunized against hepatitis B (hepatitis B vaccine). ? You have HIV or AIDS. ? You use needles to inject street drugs. ? You live with someone who has hepatitis B. ? You have had sex with someone who has hepatitis B. ? You get hemodialysis treatment. ? You take certain medicines for conditions, including cancer, organ transplantation, and autoimmune conditions.  Hepatitis C  Blood testing is recommended for: ? Everyone born from [birth year] through 1965. ? Anyone with known risk factors for hepatitis C.  Sexually transmitted infections (STIs)  You should be screened for sexually transmitted infections (STIs) including gonorrhea and chlamydia if: ? You are sexually active and are younger than 61 years of age. ? You are older than 61 years of age and your health care provider tells you that you are at risk for this type of infection. ? Your sexual activity has changed since you were last screened and you are at an increased risk for chlamydia or gonorrhea. Ask your health care provider if you are at risk.  If you do not have HIV, but are at risk, it may be recommended that you take a prescription medicine daily to prevent HIV infection. This is called pre-exposure prophylaxis (PrEP). You are considered at risk if: ? You are sexually active and do not regularly use condoms or know the HIV status of your partner(s). ? You take drugs by injection. ? You are  sexually active with a partner who has HIV.  Talk with your health care provider about whether you are at high risk of being infected with HIV. If you choose to begin PrEP, you should first be tested for HIV. You should then be tested every 3 months for as long as you are taking PrEP. Pregnancy  If you are premenopausal and you may become pregnant, ask your health care provider about preconception counseling.  If you may become  pregnant, take 400 to 800 micrograms (mcg) of folic acid every day.  If you want to prevent pregnancy, talk to your health care provider about birth control (contraception). Osteoporosis and menopause  Osteoporosis is a disease in which the bones lose minerals and strength with aging. This can result in serious bone fractures. Your risk for osteoporosis can be identified using a bone density scan.  If you are 65 years of age or older, or if you are at risk for osteoporosis and fractures, ask your health care provider if you should be screened.  Ask your health care provider whether you should take a calcium or vitamin D supplement to lower your risk for osteoporosis.  Menopause may have certain physical symptoms and risks.  Hormone replacement therapy may reduce some of these symptoms and risks. Talk to your health care provider about whether hormone replacement therapy is right for you. Follow these instructions at home:  Schedule regular health, dental, and eye exams.  Stay current with your immunizations.  Do not use any tobacco products including cigarettes, chewing tobacco, or electronic cigarettes.  If you are pregnant, do not drink alcohol.  If you are breastfeeding, limit how much and how often you drink alcohol.  Limit alcohol intake to no more than 1 drink per day for nonpregnant women. One drink equals 12 ounces of beer, 5 ounces of wine, or 1 ounces of hard liquor.  Do not use street drugs.  Do not share needles.  Ask your health care  provider for help if you need support or information about quitting drugs.  Tell your health care provider if you often feel depressed.  Tell your health care provider if you have ever been abused or do not feel safe at home. This information is not intended to replace advice given to you by your health care provider. Make sure you discuss any questions you have with your health care provider. Document Released: 05/30/2011 Document Revised: 04/21/2016 Document Reviewed: 08/18/2015 Elsevier Interactive Patient Education  2018 [employer]   Please help us help you:  We are honored you have chosen [facility] for your Primary Care home. Below you will find basic instructions that you may need to access in the future. Please help us help you by reading the instructions, which cover many of the frequent questions we experience.   Prescription refills and request:  -In order to allow more efficient response time, please call your pharmacy for all refills. They will forward the request electronically to us. This allows for the quickest possible response. Request left on a nurse line can take longer to refill, since these are checked as time allows between office patients and other phone calls.  - refill request can take up to 3-5 working days to complete.  - If request is sent electronically and request is appropiate, it is usually completed in 1-2 business days.  - all patients will need to be seen routinely for all chronic medical conditions requiring prescription medications (see follow-up below). If you are overdue for follow up on your condition, you will be asked to make an appointment and we will call in enough medication to cover you until your appointment (up to 30 days).  - all controlled substances will require a face to face visit to request/refill.  - if you desire your prescriptions to go through a new pharmacy, and have an active script at original pharmacy, you will need to call  your pharmacy and have scripts transferred to   new pharmacy. This is completed between the pharmacy locations and not by your provider.    Results: If any images or labs were ordered, it can take up to 1 week to get results depending on the test ordered and the lab/facility running and resulting the test. - Normal or stable results, which do not need further discussion, may be released to your mychart immediately with attached note to you. A call may not be generated for normal results. Please make certain to sign up for mychart. If you have questions on how to activate your mychart you can call the front office.  - If your results need further discussion, our office will attempt to contact you via phone, and if unable to reach you after 2 attempts, we will release your abnormal result to your mychart with instructions.  - All results will be automatically released in mychart after 1 week.  - Your provider will provide you with explanation and instruction on all relevant material in your results. Please keep in mind, results and labs may appear confusing or abnormal to the untrained eye, but it does not mean they are actually abnormal for you personally. If you have any questions about your results that are not covered, or you desire more detailed explanation than what was provided, you should make an appointment with your provider to do so.   Our office handles many outgoing and incoming calls daily. If we have not contacted you within 1 week about your results, please check your mychart to see if there is a message first and if not, then contact our office.  In helping with this matter, you help decrease call volume, and therefore allow us to be able to respond to patients needs more efficiently.   Acute office visits (sick visit):  An acute visit is intended for a new problem and are scheduled in shorter time slots to allow schedule openings for patients with new problems. This is the appropriate visit  to discuss a new problem. In order to provide you with excellent quality medical care with proper time for you to explain your problem, have an exam and receive treatment with instructions, these appointments should be limited to one new problem per visit. If you experience a new problem, in which you desire to be addressed, please make an acute office visit, we save openings on the schedule to accommodate you. Please do not save your new problem for any other type of visit, let us take care of it properly and quickly for you.   Follow up visits:  Depending on your condition(s) your provider will need to see you routinely in order to provide you with quality care and prescribe medication(s). Most chronic conditions (Example: hypertension, Diabetes, depression/anxiety... etc), require visits a couple times a year. Your provider will instruct you on proper follow up for your personal medical conditions and history. Please make certain to make follow up appointments for your condition as instructed. Failing to do so could result in lapse in your medication treatment/refills. If you request a refill, and are overdue to be seen on a condition, we will always provide you with a 30 day script (once) to allow you time to schedule.    Medicare wellness (well visit): - we have a wonderful Nurse (Kim), that will meet with you and provide you will yearly medicare wellness visits. These visits should occur yearly (can not be scheduled less than 1 calendar year apart) and cover preventive health, immunizations, advance directives and   you are entitled to yearly through your medicare benefits. Do not miss out on your entitled benefits, this is when medicare will pay for these benefits to be ordered for you.  These are strongly encouraged by your provider and is the appropriate type of visit to make certain you are up to date with all preventive health benefits. If you have not had your medicare wellness exam in  the last 12 months, please make certain to schedule one by calling the office and schedule your medicare wellness with Maudie Mercury as soon as possible.   Yearly physical (well visit):  - Adults are recommended to be seen yearly for physicals. Check with your insurance and date of your last physical, most insurances require one calendar year between physicals. Physicals include all preventive health topics, screenings, medical exam and labs that are appropriate for gender/age and history. You may have fasting labs needed at this visit. This is a well visit (not a sick visit), new problems should not be covered during this visit (see acute visit).  - Pediatric patients are seen more frequently when they are younger. Your provider will advise you on well child visit timing that is appropriate for your their age. - This is not a medicare wellness visit. Medicare wellness exams do not have an exam portion to the visit. Some medicare companies allow for a physical, some do not allow a yearly physical. If your medicare allows a yearly physical you can schedule the medicare wellness with our nurse Maudie Mercury and have your physical with your provider after, on the same day. Please check with insurance for your full benefits.   Late Policy/No Shows:  - all new patients should arrive 15-30 minutes earlier than appointment to allow [place] time  to  obtain all personal demographics,  insurance information and for you to complete office paperwork. - All established patients should arrive 10-15 minutes earlier than appointment time to update all information and be checked in .  - In our best efforts to run on time, if you are late for your appointment you will be asked to either reschedule or if able, we will work you back into the schedule. There will be a wait time to work you back in the schedule,  depending on availability.  - If you are unable to make it to your appointment as scheduled, please call 24 hours ahead of time to allow [place]  to fill the time slot with someone else who needs to be seen. If you do not cancel your appointment ahead of time, you may be charged a no show fee.

## 2017-09-04 ENCOUNTER — Ambulatory Visit: Payer: BC Managed Care – PPO | Admitting: Obstetrics and Gynecology

## 2017-09-14 ENCOUNTER — Encounter: Payer: Self-pay | Admitting: Obstetrics and Gynecology

## 2017-09-14 ENCOUNTER — Ambulatory Visit (INDEPENDENT_AMBULATORY_CARE_PROVIDER_SITE_OTHER): Payer: BC Managed Care – PPO | Admitting: Obstetrics and Gynecology

## 2017-09-14 VITALS — BP 120/68 | HR 50 | Ht 67.5 in | Wt 202.0 lb

## 2017-09-14 DIAGNOSIS — Z9889 Other specified postprocedural states: Secondary | ICD-10-CM

## 2017-09-14 DIAGNOSIS — T83711D Erosion of implanted vaginal mesh and other prosthetic materials to surrounding organ or tissue, subsequent encounter: Secondary | ICD-10-CM

## 2017-09-14 NOTE — Progress Notes (Signed)
GYNECOLOGY  VISIT   HPI: 61 y.o.   Married  Caucasian  female   G3P3 with No LMP recorded. Patient has had a hysterectomy.   here for 3 month follow up from ABDOMINO SACROCOLPOPEXY with Halbans culdoplasty (N/A Abdomen) [12458 CPT (R)] ANTERIOR (CYSTOCELE) AND POSTERIOR REPAIR (RECTOCELE) (N/A Vagina ) TRANSVAGINAL TAPE (TVT) PROCEDURE (N/A Vagina ) [09983 CPT (R)] CYSTOSCOPY (N/A Bladder) [52000 CPT (R)] BILATERAL SALPINGECTOMY (Bilateral Abdomen)[58700 CPT (R)]. Now 3 months post op.  Discomfort with BMs are better.  GYNECOLOGIC HISTORY: No LMP recorded. Patient has had a hysterectomy. Contraception: Hysterectomy Menopausal hormone therapy:  Premarin vaginal cream Last mammogram:  01-17-17 Density B/Neg/BiRads1:TBC Last pap smear: Years ago        OB History    Gravida Para Term Preterm AB Living   3 3       3    SAB TAB Ectopic Multiple Live Births           3         Patient Active Problem List   Diagnosis Date Noted  . MVP (mitral valve prolapse) 03/14/2017  . Family history of aortic aneurysm 03/14/2017  . Osteoarthritis 05/25/2016  . Elevated hemoglobin A1c 05/25/2016  . BMI 34.0-34.9,adult 05/25/2016  . Vitamin D deficiency 05/12/2015  . History of colon polyps 05/27/2014  . Vasomotor flushing 03/17/2014  . Personal history of cardiac arrhythmia 03/17/2014    Past Medical History:  Diagnosis Date  . Arthritis   . Cancer ([place])    skin on nose  . Cervical disc disease   . Cystocele   . Heart murmur   . History of adenomatous polyp of colon 06/24/15  . History of rheumatic fever    childhood  . Hypothyroidism   . 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 surveillance  . Mitral valve prolapse   . PONV (postoperative nausea and vomiting)   . Primary osteoarthritis of right hip    Intraarticular steroid injection by Dr. Nelva Bush 01/2016  . Rectocele   . Rectocele   . SA node dysfunction [facility])     Past Surgical History:  Procedure  Laterality Date  . ABDOMINAL HYSTERECTOMY  05/1996   TVH--ovaries remain  . ABDOMINAL SACROCOLPOPEXY N/A 06/13/2017   Procedure: ABDOMINO SACROCOLPOPEXY with Halbans culdoplasty;  Surgeon: Nunzio Cobbs, MD;  Location: [place] [facility];  Service: Gynecology;  Laterality: N/A;  . ANTERIOR AND POSTERIOR REPAIR N/A 06/13/2017   Procedure: ANTERIOR (CYSTOCELE) AND POSTERIOR REPAIR (RECTOCELE);  Surgeon: Nunzio Cobbs, MD;  Location: [place] [facility];  Service: Gynecology;  Laterality: N/A;  . arthroscopic knee Right   . BACK SURGERY  2012   disc rupture  . BILATERAL SALPINGECTOMY Bilateral 06/13/2017   Procedure: BILATERAL SALPINGECTOMY;  Surgeon: Nunzio Cobbs, MD;  Location: [place] [facility];  Service: Gynecology;  Laterality: Bilateral;  . BLADDER SUSPENSION N/A 06/13/2017   Procedure: TRANSVAGINAL TAPE (TVT) PROCEDURE;  Surgeon: Nunzio Cobbs, MD;  Location: [place] [facility];  Service: Gynecology;  Laterality: N/A;  . COLONOSCOPY W/ POLYPECTOMY  06/24/15   Recall 5 yrs ([facility])  . CYSTOSCOPY N/A 06/13/2017   Procedure: CYSTOSCOPY;  Surgeon: Nunzio Cobbs, MD;  Location: [facility];  Service: Gynecology;  Laterality: N/A;  . KNEE RECONSTRUCTION Right 2008  . LIVER BIOPSY  1997   fatty tumors    Current Outpatient Prescriptions  Medication Sig Dispense Refill  .  aspirin 81 MG tablet Take 81 mg by mouth daily.    . Calcium Carb-Cholecalciferol (CALCIUM 600+D3) 600-800 MG-UNIT TABS Take 1 tablet by mouth daily.    Marland Kitchen conjugated estrogens (PREMARIN) vaginal cream Use 1/2 g vaginally every night at bed time for 2 weeks, then use 1/2 g vaginally two times per week. 30 g 1  . escitalopram (LEXAPRO) 10 MG tablet Take 1 tablet (10 mg total) by mouth daily. 90 tablet 3  . levothyroxine (LEVOTHROID) 25 MCG tablet Take 1 tablet (25 mcg total) by mouth daily before breakfast. 90 tablet 3  . polyethylene glycol powder (GLYCOLAX/MIRALAX) powder Take 17 g by mouth  daily.     . Vitamin D, Ergocalciferol, (DRISDOL) 50000 units CAPS capsule Take 1 capsule (50,000 Units total) by mouth every 7 (seven) days. 8 capsule 0   No current facility-administered medications for this visit.      ALLERGIES: Patient has no known allergies.  Family History  Problem Relation Age of Onset  . Alcohol abuse Mother   . CVA Mother        Dec 65  . Heart disease Mother   . Cancer Father        prostate  . Arthritis Father   . Heart murmur Son   . Alcohol abuse Sister   . Heart disease Brother        congenital heart defect--Dec age 53 from MI  . Heart murmur Son   . Heart murmur Son   . Cancer Maternal Aunt 50       Dec colon CA age 90  . Cancer Cousin 10       Dec colon CA age 51    Social History   Social History  . Marital status: Married    Spouse name: [name]  . Number of children: 3  . Years of education: N/A   Occupational History  . [employer]   Social History Main Topics  . Smoking status: Never Smoker  . Smokeless tobacco: Never Used  . Alcohol use 2.4 oz/week    4 Glasses of wine per week  . Drug use: No  . Sexual activity: No     Comment: Hyst   Other Topics Concern  . Not on file   Social History Narrative   Ms. Einstein lives in [place] with her husband.  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 She is from [place].    ROS:  Pertinent items are noted in HPI.  PHYSICAL EXAMINATION:    BP 120/68 (BP Location: Right Arm, Patient Position: Sitting, Cuff Size: Large)   Pulse (!) 50   Ht 5' 7.5" (1.715 m)   Wt 202 lb (91.6 kg)   BMI 31.17 kg/m     General appearance: alert, cooperative and appears stated age    Pelvic: External genitalia:  no lesions              Urethra:  normal appearing urethra with no masses, tenderness or lesions              Bartholins and Skenes: normal                 Vagina:  Good support.  Vaginal cuff  with 8 mm exposure of mesh and one Ethibond suture.              Cervix:  Absent.  Bimanual Exam:  Uterus:   Absent.              Adnexa:  No masses, nontender.          Verbal permission to trim the exposed mesh.  Mesh and Ethibond picked up with ring forceps and excised with a scissors.  Shown to patient and discarded.  Chaperone was present for exam.  ASSESSMENT  Small vaginal mesh erosion following sacrocolpopexy.  Excised today.   PLAN  Resume vaginal estrogen cream in 1 week and follow up in 3 weeks, hopefully for final postop check.  Ii do not expect the very small area that was excised from the mesh to increase her risk of failure of her prolapse repair.   This was done to avoid dyspareunia.    An After Visit Summary was printed and given to the patient.

## 2017-10-05 ENCOUNTER — Ambulatory Visit (INDEPENDENT_AMBULATORY_CARE_PROVIDER_SITE_OTHER): Payer: BC Managed Care – PPO | Admitting: Obstetrics and Gynecology

## 2017-10-05 ENCOUNTER — Other Ambulatory Visit: Payer: Self-pay

## 2017-10-05 ENCOUNTER — Encounter: Payer: Self-pay | Admitting: Obstetrics and Gynecology

## 2017-10-05 VITALS — BP 110/58 | HR 74 | Resp 14 | Ht 67.5 in | Wt 205.0 lb

## 2017-10-05 DIAGNOSIS — N8189 Other female genital prolapse: Secondary | ICD-10-CM

## 2017-10-05 NOTE — Progress Notes (Signed)
GYNECOLOGY  VISIT   HPI: 61 y.o.   Married  Caucasian  female   G3P3 with No LMP recorded. Patient has had a hysterectomy.   here for follow up.    Sacrocolpopexy fibers were clipped at her last office visit on 09/14/17.   Still difficult to eliminate bowel movements.  Feels like she does not have enough push power.  Has a friend who is a PT and this person gave her some pelvic floor exercises to do.  Having a right knee replacement on 11/09/17.   Marital strife. Not having vaginal intercourse.    GYNECOLOGIC HISTORY: No LMP recorded. Patient has had a hysterectomy. Contraception:  Status post hysterectomy Menopausal hormone therapy:  Premarin cream Last mammogram:  01-17-17 Density B/Neg/BiRads1:TBC Last pap smear:   Years ago, normal per patient        OB History    Gravida Para Term Preterm AB Living   3 3       3    SAB TAB Ectopic Multiple Live Births           3         Patient Active Problem List   Diagnosis Date Noted  . MVP (mitral valve prolapse) 03/14/2017  . Family history of aortic aneurysm 03/14/2017  . Osteoarthritis 05/25/2016  . Elevated hemoglobin A1c 05/25/2016  . BMI 34.0-34.9,adult 05/25/2016  . Vitamin D deficiency 05/12/2015  . History of colon polyps 05/27/2014  . Vasomotor flushing 03/17/2014  . Personal history of cardiac arrhythmia 03/17/2014    Past Medical History:  Diagnosis Date  . Arthritis   . Cancer ([place])    skin on nose  . Cervical disc disease   . Cystocele   . Heart murmur   . History of adenomatous polyp of colon 06/24/15  . History of rheumatic fever    childhood  . Hypothyroidism   . 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 surveillance  . Mitral valve prolapse   . PONV (postoperative nausea and vomiting)   . Primary osteoarthritis of right hip    Intraarticular steroid injection by Dr. Nelva Bush 01/2016  . Rectocele   . Rectocele   . SA node dysfunction [facility])     Past Surgical History:   Procedure Laterality Date  . ABDOMINAL HYSTERECTOMY  05/1996   TVH--ovaries remain  . arthroscopic knee Right   . BACK SURGERY  2012   disc rupture  . COLONOSCOPY W/ POLYPECTOMY  06/24/15   Recall 5 yrs ([facility])  . KNEE RECONSTRUCTION Right 2008  . LIVER BIOPSY  1997   fatty tumors    Current Outpatient Medications  Medication Sig Dispense Refill  . aspirin 81 MG tablet Take 81 mg by mouth daily.    . Calcium Carb-Cholecalciferol (CALCIUM 600+D3) 600-800 MG-UNIT TABS Take 1 tablet by mouth daily.    Marland Kitchen conjugated estrogens (PREMARIN) vaginal cream Use 1/2 g vaginally every night at bed time for 2 weeks, then use 1/2 g vaginally two times per week. 30 g 1  . escitalopram (LEXAPRO) 10 MG tablet Take 1 tablet (10 mg total) by mouth daily. 90 tablet 3  . levothyroxine (LEVOTHROID) 25 MCG tablet Take 1 tablet (25 mcg total) by mouth daily before breakfast. 90 tablet 3  . polyethylene glycol powder (GLYCOLAX/MIRALAX) powder Take 17 g by mouth daily.     . Vitamin D, Ergocalciferol, (DRISDOL) 50000 units CAPS capsule Take 1 capsule (50,000 Units total) by  mouth every 7 (seven) days. 8 capsule 0   No current facility-administered medications for this visit.      ALLERGIES: Patient has no known allergies.  Family History  Problem Relation Age of Onset  . Alcohol abuse Mother   . CVA Mother        Dec 65  . Heart disease Mother   . Cancer Father        prostate  . Arthritis Father   . Heart murmur Son   . Alcohol abuse Sister   . Heart disease Brother        congenital heart defect--Dec age 25 from MI  . Heart murmur Son   . Heart murmur Son   . Cancer Maternal Aunt 50       Dec colon CA age 39  . Cancer Cousin 48       Dec colon CA age 84    Social History   Socioeconomic History  . Marital status: Married    Spouse name: [name]  . Number of children: 3  . Years of education: Not on file  . Highest education level: Not on file  Social  Needs  . Financial resource strain: Not on file  . Food insecurity - worry: Not on file  . Food insecurity - inability: Not on file  . Transportation needs - medical: Not on file  . Transportation needs - non-medical: Not on file  Occupational History  . Occupation: Product manager: [employer]  Tobacco Use  . Smoking status: Never Smoker  . Smokeless tobacco: Never Used  Substance and Sexual Activity  . Alcohol use: Yes    Alcohol/week: 2.4 oz    Types: 4 Glasses of wine per week  . Drug use: No  . Sexual activity: No    Birth control/protection: Other-see comments, Surgical    Comment: Hyst  Other Topics Concern  . Not on file  Social History Narrative   Ms. Bratz lives in [place] with her husband.  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 She is from [place].    ROS:  Pertinent items are noted in HPI.  PHYSICAL EXAMINATION:    BP (!) 110/58 (BP Location: Right Arm, Patient Position: Sitting, Cuff Size: Large)   Pulse 74   Resp 14   Ht 5' 7.5" (1.715 m)   Wt 205 lb (93 kg)   BMI 31.63 kg/m     General appearance: alert, cooperative and appears stated age   Pelvic: External genitalia:  no lesions              Urethra:  normal appearing urethra with no masses, tenderness or lesions              Bartholins and Skenes: normal                 Vagina:  Small area of superficial fibers noted at posterior vaginal apex.  I can see an Ethibond suture under the mucosa.  Small midvaginal rectocele.               Cervix: absent.                 Bimanual Exam:  Uterus:  Absent.               Adnexa: no mass, fullness, tenderness  Rectal exam: Yes.  .  Confirms.              Anus:  normal sphincter tone, no lesions  Chaperone was present for exam.  ASSESSMENT  Status post abdominal sacral colpopexy, Hallban's culdoplasty, bilateral salpingectomy, anterior and posterior  colporrhaphy, TVT Exact midurethral sling and cystoscopy. Pelvic floor weakness following prolapse repair.  Small rectocele.  Mesh exposure.   Using local vaginal estrogen treatment.   PLAN  No need for revision of vaginal vault with mesh fibers at this time. I shared with the patient that it looked like she has a small mid rectocele. Referral to [facility].  Continue vaginal estrogen 1/2 gram pv twice weekly.  Vaginal intercourse ok if she would like to pursue this. Follow up in 3 months.    An After Visit Summary was printed and given to the patient.

## 2017-10-06 ENCOUNTER — Encounter: Payer: Self-pay | Admitting: Obstetrics and Gynecology

## 2017-10-16 ENCOUNTER — Emergency Department (HOSPITAL_COMMUNITY)
Admission: EM | Admit: 2017-10-16 | Discharge: 2017-10-16 | Disposition: A | Discharge status: Home / Self Care | Payer: BC Managed Care – PPO | Attending: Emergency Medicine | Admitting: Emergency Medicine

## 2017-10-16 ENCOUNTER — Emergency Department (HOSPITAL_COMMUNITY): Payer: BC Managed Care – PPO

## 2017-10-16 ENCOUNTER — Ambulatory Visit: Payer: BC Managed Care – PPO | Admitting: Family Medicine

## 2017-10-16 ENCOUNTER — Encounter (HOSPITAL_COMMUNITY): Payer: Self-pay

## 2017-10-16 ENCOUNTER — Other Ambulatory Visit: Payer: Self-pay

## 2017-10-16 ENCOUNTER — Encounter: Payer: Self-pay | Admitting: Family Medicine

## 2017-10-16 VITALS — BP 119/75 | HR 75 | Temp 98.4°F | Resp 20 | Wt 205.0 lb

## 2017-10-16 DIAGNOSIS — L0231 Cutaneous abscess of buttock: Secondary | ICD-10-CM

## 2017-10-16 DIAGNOSIS — Z85828 Personal history of other malignant neoplasm of skin: Secondary | ICD-10-CM | POA: Insufficient documentation

## 2017-10-16 DIAGNOSIS — L089 Local infection of the skin and subcutaneous tissue, unspecified: Secondary | ICD-10-CM | POA: Insufficient documentation

## 2017-10-16 DIAGNOSIS — Z79899 Other long term (current) drug therapy: Secondary | ICD-10-CM | POA: Insufficient documentation

## 2017-10-16 DIAGNOSIS — Z7982 Long term (current) use of aspirin: Secondary | ICD-10-CM | POA: Diagnosis not present

## 2017-10-16 DIAGNOSIS — R222 Localized swelling, mass and lump, trunk: Secondary | ICD-10-CM | POA: Diagnosis present

## 2017-10-16 DIAGNOSIS — L03317 Cellulitis of buttock: Secondary | ICD-10-CM | POA: Diagnosis not present

## 2017-10-16 DIAGNOSIS — E039 Hypothyroidism, unspecified: Secondary | ICD-10-CM | POA: Diagnosis not present

## 2017-10-16 LAB — CBC WITH DIFFERENTIAL/PLATELET
BASOS PCT: 0 %
Basophils Absolute: 0 10*3/uL (ref 0.0–0.1)
EOS ABS: 0.1 10*3/uL (ref 0.0–0.7)
Eosinophils Relative: 1 %
HEMATOCRIT: 39 % (ref 36.0–46.0)
Hemoglobin: 12.6 g/dL (ref 12.0–15.0)
Lymphocytes Relative: 20 %
Lymphs Abs: 2.8 10*3/uL (ref 0.7–4.0)
MCH: 28.6 pg (ref 26.0–34.0)
MCHC: 32.3 g/dL (ref 30.0–36.0)
MCV: 88.6 fL (ref 78.0–100.0)
MONO ABS: 0.5 10*3/uL (ref 0.1–1.0)
MONOS PCT: 4 %
Neutro Abs: 10.5 10*3/uL — ABNORMAL HIGH (ref 1.7–7.7)
Neutrophils Relative %: 75 %
Platelets: 242 10*3/uL (ref 150–400)
RBC: 4.4 MIL/uL (ref 3.87–5.11)
RDW: 14.4 % (ref 11.5–15.5)
WBC: 14 10*3/uL — ABNORMAL HIGH (ref 4.0–10.5)

## 2017-10-16 LAB — COMPREHENSIVE METABOLIC PANEL
ALBUMIN: 4.2 g/dL (ref 3.5–5.0)
ALK PHOS: 105 U/L (ref 38–126)
ALT: 13 U/L — AB (ref 14–54)
AST: 21 U/L (ref 15–41)
Anion gap: 7 (ref 5–15)
BUN: 10 mg/dL (ref 6–20)
CALCIUM: 9 mg/dL (ref 8.9–10.3)
CO2: 25 mmol/L (ref 22–32)
CREATININE: 0.74 mg/dL (ref 0.44–1.00)
Chloride: 105 mmol/L (ref 101–111)
GFR calc Af Amer: >60 mL/min (ref >60)
GFR calc non Af Amer: >60 mL/min (ref >60)
GLUCOSE: 101 mg/dL — AB (ref 65–99)
Potassium: 4.8 mmol/L (ref 3.5–5.1)
SODIUM: 137 mmol/L (ref 135–145)
Total Bilirubin: 0.6 mg/dL (ref 0.3–1.2)
Total Protein: 7 g/dL (ref 6.5–8.1)

## 2017-10-16 LAB — I-STAT CG4 LACTIC ACID, ED: Lactic Acid, Venous: 1.83 mmol/L (ref 0.5–1.9)

## 2017-10-16 MED ORDER — OXYCODONE-ACETAMINOPHEN 5-325 MG PO TABS
1.0000 | ORAL_TABLET | ORAL | Status: DC | PRN
  Administered 2017-10-16: 1 via ORAL
  Filled 2017-10-16: qty 1

## 2017-10-16 MED ORDER — MORPHINE SULFATE (PF) 4 MG/ML IV SOLN
4.0000 mg | Freq: Once | INTRAVENOUS | Status: AC
  Administered 2017-10-16: 4 mg via INTRAVENOUS
  Filled 2017-10-16: qty 1

## 2017-10-16 MED ORDER — IOPAMIDOL (ISOVUE-300) INJECTION 61%
INTRAVENOUS | Status: AC
  Administered 2017-10-16: 100 mL via INTRAVENOUS
  Filled 2017-10-16: qty 100

## 2017-10-16 MED ORDER — OXYCODONE-ACETAMINOPHEN 5-325 MG PO TABS: 2.0000 | ORAL_TABLET | ORAL | 0 refills | Status: DC | PRN

## 2017-10-16 MED ORDER — CLINDAMYCIN HCL 150 MG PO CAPS: 450.0000 mg | ORAL_CAPSULE | Freq: Four times a day (QID) | ORAL | 0 refills | Status: AC

## 2017-10-16 NOTE — ED Provider Notes (Signed)
[facility] EMERGENCY DEPARTMENT Provider Note   CSN: [id number] Arrival date & time: 10/16/17  1223     History   Chief Complaint Chief Complaint  Patient presents with  . Abscess    possible perirectal    HPI [name] is a 61 y.o. female.  The history is provided by the patient.  Abscess  Location:  Pelvis Pelvic abscess location:  R buttock Size:  5cm Abscess quality: induration, painful, redness and warmth   Red streaking: no   Duration:  6 days Progression:  Worsening Pain details:    Quality:  Pressure and aching   Severity:  Severe   Duration:  6 days   Timing:  Constant   Progression:  Worsening Chronicity:  New Context: not diabetes, not immunosuppression, not injected drug use and not skin injury   Relieved by:  Nothing Exacerbated by: sitting on the buttock. Ineffective treatments:  None tried Associated symptoms: no fatigue, no fever, no headaches, no nausea and no vomiting      Past Medical History:  Diagnosis Date  . Arthritis   . Cancer ([place])    skin on nose  . Cervical disc disease   . Cystocele   . Heart murmur   . History of adenomatous polyp of colon 06/24/15  . History of rheumatic fever    childhood  . Hypothyroidism   . 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 surveillance  . Mitral valve prolapse   . PONV (postoperative nausea and vomiting)   . Primary osteoarthritis of right hip    Intraarticular steroid injection by Dr. Nelva Bush 01/2016  . Rectocele   . Rectocele   . SA node dysfunction [facility])     Patient Active Problem List   Diagnosis Date Noted  . MVP (mitral valve prolapse) 03/14/2017  . Family history of aortic aneurysm 03/14/2017  . Osteoarthritis 05/25/2016  . Elevated hemoglobin A1c 05/25/2016  . BMI 34.0-34.9,adult 05/25/2016  . Vitamin D deficiency 05/12/2015  . History of colon polyps 05/27/2014  . Vasomotor flushing 03/17/2014  . Personal history of cardiac  arrhythmia 03/17/2014    Past Surgical History:  Procedure Laterality Date  . ABDOMINAL HYSTERECTOMY  05/1996   TVH--ovaries remain  . ABDOMINO SACROCOLPOPEXY with Halbans culdoplasty N/A 06/13/2017   Performed by Nunzio Cobbs, MD at [facility]  . ANTERIOR (CYSTOCELE) AND POSTERIOR REPAIR (RECTOCELE) N/A 06/13/2017   Performed by Nunzio Cobbs, MD at [facility]  . arthroscopic knee Right   . BACK SURGERY  2012   disc rupture  . BILATERAL SALPINGECTOMY Bilateral 06/13/2017   Performed by Nunzio Cobbs, MD at [facility]  . COLONOSCOPY W/ POLYPECTOMY  06/24/15   Recall 5 yrs (Dr[facility])  . CYSTOSCOPY N/A 06/13/2017   Performed by Nunzio Cobbs, MD at [facility] ORS  . KNEE RECONSTRUCTION Right 2008  . LIVER BIOPSY  1997   fatty tumors  . TRANSVAGINAL TAPE (TVT) PROCEDURE N/A 06/13/2017   Performed by Nunzio Cobbs, MD at [facility] ORS    OB History    Gravida Para Term Preterm AB Living   3 3       3    SAB TAB Ectopic Multiple Live Births           3       Home Medications    Prior to Admission medications  Medication Sig Start Date End Date Taking? Authorizing Provider  aspirin 81 MG tablet Take 81 mg by mouth daily.    [provider]  Calcium Carb-Cholecalciferol (CALCIUM 600+D3) 600-800 MG-UNIT TABS Take 1 tablet by mouth daily.    [provider]  conjugated estrogens (PREMARIN) vaginal cream Use 1/2 g vaginally every night at bed time for 2 weeks, then use 1/2 g vaginally two times per week. 07/21/17   Nunzio Cobbs, MD  escitalopram (LEXAPRO) 10 MG tablet Take 1 tablet (10 mg total) by mouth daily. 08/16/17   Kuneff, Renee A, DO  levothyroxine (LEVOTHROID) 25 MCG tablet Take 1 tablet (25 mcg total) by mouth daily before breakfast. 08/16/17   Kuneff, Renee A, DO  polyethylene glycol powder (GLYCOLAX/MIRALAX) powder Take 17 g by mouth daily.  02/15/17   [provider]     Family History Family History  Problem Relation Age of Onset  . Alcohol abuse Mother   . CVA Mother        Dec 65  . Heart disease Mother   . Cancer Father        prostate  . Arthritis Father   . Heart murmur Son   . Alcohol abuse Sister   . Heart disease Brother        congenital heart defect--Dec age 60 from MI  . Heart murmur Son   . Heart murmur Son   . Cancer Maternal Aunt 50       Dec colon CA age 83  . Cancer Cousin 47       Dec colon CA age 68    Social History Social History   Tobacco Use  . Smoking status: Never Smoker  . Smokeless tobacco: Never Used  Substance Use Topics  . Alcohol use: Yes    Alcohol/week: 2.4 oz    Types: 4 Glasses of wine per week  . Drug use: No     Allergies   Patient has no known allergies.   Review of Systems Review of Systems  Constitutional: Negative for chills, fatigue and fever.  HENT: Negative for ear pain and sore throat.   Eyes: Negative for pain and visual disturbance.  Respiratory: Negative for cough and shortness of breath.   Cardiovascular: Negative for chest pain and palpitations.  Gastrointestinal: Negative for abdominal pain, nausea and vomiting.  Genitourinary: Negative for dysuria and hematuria.  Musculoskeletal: Negative for arthralgias and back pain.  Skin: Positive for rash and wound. Negative for color change.  Neurological: Negative for seizures, syncope and headaches.  All other systems reviewed and are negative.    Physical Exam Updated Vital Signs BP (!) 107/59   Pulse 76   Temp 99.9 F (37.7 C) (Oral)   Resp 18   SpO2 99%   Physical Exam  Constitutional: She is oriented to person, place, and time. She appears well-developed and well-nourished. No distress.  HENT:  Head: Normocephalic and atraumatic.  Eyes: Conjunctivae are normal.  Neck: Normal range of motion. Neck supple.  Cardiovascular: Normal rate and regular rhythm.  No murmur heard. Pulmonary/Chest: Effort normal and  breath sounds normal. No respiratory distress.  Abdominal: Soft. There is no tenderness.  Musculoskeletal: She exhibits no edema.  Neurological: She is alert and oriented to person, place, and time.  Skin: Skin is warm and dry. There is erythema.     5cm area of induration and erythema with a 1cm area of central necrosis, no obvious fluctuance. Anus appears to be  spared.  Psychiatric: She has a normal mood and affect.  Nursing note and vitals reviewed.    ED Treatments / Results  Labs (all labs ordered are listed, but only abnormal results are displayed) Labs Reviewed  COMPREHENSIVE METABOLIC PANEL - Abnormal; Notable for the following components:      Result Value   Glucose, Bld 101 (*)    ALT 13 (*)    All other components within normal limits  CBC WITH DIFFERENTIAL/PLATELET - Abnormal; Notable for the following components:   WBC 14.0 (*)    Neutro Abs 10.5 (*)    All other components within normal limits  I-STAT CG4 LACTIC ACID, ED    EKG  EKG Interpretation None       Radiology Ct Abdomen Pelvis W Contrast  Result Date: 10/16/2017 CLINICAL DATA:  Right buttock mass starting 6 days ago. EXAM: CT ABDOMEN AND PELVIS WITH CONTRAST TECHNIQUE: Multidetector CT imaging of the abdomen and pelvis was performed using the standard protocol following bolus administration of intravenous contrast. CONTRAST:  142mL ISOVUE-300 IOPAMIDOL (ISOVUE-300) INJECTION 61% COMPARISON:  None. FINDINGS: Lower chest: Bibasilar dependent atelectasis. The included heart size is normal without pericardial effusion. Small hiatal hernia. Hepatobiliary: No space-occupying mass of the liver. No gallstones or secondary signs of acute acute cholecystitis. No biliary dilatation. Pancreas: Normal Spleen: 1.5 cm circumscribed hypodensity consistent with a acquired or congenital cyst. No splenomegaly. Adrenals/Urinary Tract: Tiny too small to further characterize ovoid nodule at the left adrenal apex measuring 7 x  9 mm. The kidneys, ureters and bladder are nonacute. Stomach/Bowel: Nondistended stomach. Normal small bowel rotation without inflammation or obstruction. A moderate to large amount of fecal retention is seen within the colon without inflammation. Normal appendix. Vascular/Lymphatic: No significant vascular findings are present. No enlarged abdominal or pelvic lymph nodes. Reproductive: Status post hysterectomy. No adnexal masses. Other: Small fat containing umbilical hernia. Musculoskeletal: There is subcutaneous soft tissue induration of the the right buttock medially along the natal cleft. No drainable fluid collections are noted. Degenerative disc disease with vacuum disc phenomenon at L4-5. Multiple Schmorl's nodes along the included lower thoracic and upper lumbar spine. No kyphosis. IMPRESSION: 1. Moderate degree of subcutaneous soft tissue induration consistent with cellulitis of the right buttock. No drainable fluid collections are noted. 2. 1.5 cm splenic cyst. 3. Increased colonic stool burden. Electronically Signed   By: Ashley Royalty M.D.   On: 10/16/2017 16:28    Procedures Procedures (including critical care time)  Medications Ordered in ED Medications  oxyCODONE-acetaminophen (PERCOCET/ROXICET) 5-325 MG per tablet 1 tablet (1 tablet Oral Given 10/16/17 1244)  morphine 4 MG/ML injection 4 mg (4 mg Intravenous Given 10/16/17 1540)  iopamidol (ISOVUE-300) 61 % injection (100 mLs Intravenous Contrast Given 10/16/17 1606)     Initial Impression / Assessment and Plan / ED Course  I have reviewed the triage vital signs and the nursing notes.  Pertinent labs & imaging results that were available during my care of the patient were reviewed by me and considered in my medical decision making (see chart for details).     61yoF with hx of hypothyroidism who had an extensive reconstructive pelvic surgery on 06/13/17 for vaginal prolapse, cystocele, rectocele and stress incontinence presenting  with concern for a gluteal abscess. Afebrile and HDS. Exam is as above with a benign abdomen. Exam concerning for abscess. Due to recent pelvic surgery, concern for deeper abscess involving the rectum. CT pelvis w contrast ordered. Labs notable for a leukocytosis of 14.  Morphine given for pain.   CT with signs of cellulitis and soft tissue infection with no drainable abscess. As there is no fluid collection noted on CT, will start pt on clindamycin and have pt f/u with PCP in 48 hours for re-evaluation. Opioid database queried with no suspicious activity. Short course of percocet given. Pt amenable with plan. Strict return precautions given. D/c home in good condition.  Final Clinical Impressions(s) / ED Diagnoses   Final diagnoses:  Cellulitis of buttock  Soft tissue infection    ED Discharge Orders        Ordered    oxyCODONE-acetaminophen (PERCOCET/ROXICET) 5-325 MG tablet  Every 4 hours PRN     10/16/17 1730    clindamycin (CLEOCIN) 150 MG capsule  Every 6 hours     10/16/17 1730       Crandall Harvel Mali, MD 10/16/17 1733    Margette Fast, MD 10/17/17 [phone]

## 2017-10-16 NOTE — Progress Notes (Signed)
Cynthia [name] , 1956-06-23, 61 y.o., female MRN: [id number] Patient Care Team    Relationship Specialty Notifications Start End  Ma Hillock, DO PCP - General Family Medicine  07/14/15   Rosana Berger, MD Consulting Physician Gastroenterology  06/29/15   Melina Schools, MD Consulting Physician Orthopedic Surgery  04/13/16     Chief Complaint  Patient presents with  . Recurrent Skin Infections    right buttock x 1 week      Subjective: Pt presents for an OV with complaints of right buttocks mass and pain that started approximately 6 days ago. Patient reports the area has grown tremendously over this time, is swollen, red and draining a small amount of blood. She is unable to sit on her buttocks, or walk without severe discomfort. She has been taking antibiotic and Tylenol consistently over the last few days including this morning. She is fatigued and feels ill.  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 Last surgery 2 weeks ago.  Depression screen [facility] 2/9 08/16/2017 05/25/2016  Decreased Interest 0 0  Down, Depressed, Hopeless 0 0  PHQ - 2 Score 0 0    No Known Allergies Social History   Tobacco Use  . Smoking status: Never Smoker  . Smokeless tobacco: Never Used  Substance Use Topics  . Alcohol use: Yes    Alcohol/week: 2.4 oz    Types: 4 Glasses of wine per week   Past Medical History:  Diagnosis Date  . Arthritis   . Cancer ([place])    skin on nose  . Cervical disc disease   . Cystocele   . Heart murmur   . History of adenomatous polyp of colon 06/24/15  . History of rheumatic fever    childhood  . Hypothyroidism   . 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 surveillance  . Mitral valve prolapse   . PONV (postoperative nausea and vomiting)   . Primary osteoarthritis of right hip     Intraarticular steroid injection by Dr. Nelva Bush 01/2016  . Rectocele   . Rectocele   . SA node dysfunction [facility])    Past Surgical History:  Procedure Laterality Date  . ABDOMINAL HYSTERECTOMY  05/1996   TVH--ovaries remain  . ABDOMINO SACROCOLPOPEXY with Halbans culdoplasty N/A 06/13/2017   Performed by Nunzio Cobbs, MD at [facility] ORS  . ANTERIOR (CYSTOCELE) AND POSTERIOR REPAIR (RECTOCELE) N/A 06/13/2017   Performed by Nunzio Cobbs, MD at [facility] ORS  . arthroscopic knee Right   . BACK SURGERY  2012   disc rupture  . BILATERAL SALPINGECTOMY Bilateral 06/13/2017   Performed by Nunzio Cobbs, MD at [facility] ORS  . COLONOSCOPY W/ POLYPECTOMY  06/24/15   Recall 5 yrs ([facility])  . CYSTOSCOPY N/A 06/13/2017   Performed by Nunzio Cobbs, MD at [facility] ORS  . KNEE RECONSTRUCTION Right 2008  . LIVER BIOPSY  1997   fatty tumors  . TRANSVAGINAL TAPE (TVT) PROCEDURE N/A 06/13/2017   Performed by Nunzio Cobbs, MD at [facility] ORS   Family History  Problem Relation Age of Onset  . Alcohol abuse Mother   . CVA Mother        Dec 65  . Heart disease Mother   .  Cancer Father        prostate  . Arthritis Father   . Heart murmur Son   . Alcohol abuse Sister   . Heart disease Brother        congenital heart defect--Dec age 55 from MI  . Heart murmur Son   . Heart murmur Son   . Cancer Maternal Aunt 50       Dec colon CA age 62  . Cancer Cousin 50       Dec colon CA age 90   Allergies as of 10/16/2017   No Known Allergies     Medication List        Accurate as of 10/16/17 11:18 AM. Always use your most recent med list.          aspirin 81 MG tablet Take 81 mg by mouth daily.   CALCIUM 600+D3 600-800 MG-UNIT Tabs Generic drug:  Calcium Carb-Cholecalciferol Take 1 tablet by mouth daily.   conjugated estrogens vaginal cream Commonly known as:  PREMARIN Use 1/2 g vaginally every night at bed time for 2 weeks, then  use 1/2 g vaginally two times per week.   escitalopram 10 MG tablet Commonly known as:  LEXAPRO Take 1 tablet (10 mg total) by mouth daily.   levothyroxine 25 MCG tablet Commonly known as:  LEVOTHROID Take 1 tablet (25 mcg total) by mouth daily before breakfast.   polyethylene glycol powder powder Commonly known as:  GLYCOLAX/MIRALAX Take 17 g by mouth daily.       All past medical history, surgical history, allergies, family history, immunizations andmedications were updated in the EMR today and reviewed under the history and medication portions of their EMR.     ROS: Negative, with the exception of above mentioned in HPI   Objective:  BP 119/75 (BP Location: Left Arm, Patient Position: Sitting, Cuff Size: Large)   Pulse 75   Temp 98.4 F (36.9 C)   Resp 20   Wt 205 lb (93 kg)   SpO2 100%   BMI 31.63 kg/m  Body mass index is 31.63 kg/m. Gen: Afebrile. No acute distress. Nontoxic in appearance, well developed, well nourished. Appears ill and fatigued.  HENT: AT. [place].  MMM Eyes:Pupils Equal Round Reactive to light, Extraocular movements intact,  Conjunctiva without redness, discharge or icterus. CV: RRR  Chest: CTAB, no wheeze or crackles.   Neuro: Limp. PERLA. EOMi. Alert. Oriented x3  Right buttocks:  just right of midline, 5 in x 5 in erythema with central necrosis, baseball size firmness. No obvious drainage. Anus/rectal area spared.    No exam data present No results found. No results found for this or any previous visit (from the past 24 hour(s)).  Assessment/Plan: EARLY ORD is a 61 y.o. female present for OV for  - afebrile, however pt has taken ibuprophen and tylenol both today. 6 days of abscess formation right buttocks. Rather large with erythema and firmness.  - discussed with pt with her h/o cystocele and rectocele/sacroplexy  repair with migrating mesh surgery repeat x2 since July (last 2 weeks ago), now with right buttocks abscess I would  recommend emergent services, pt likely needing immediate labs, image, IV abx and surgery.  - [place] main campus ED called, RN spoke with [place], and informed pt was on her way. She is stopping at home so that her husband can drive her.   Reviewed expectations re: course of current medical issues.  Discussed self-management of symptoms.  Outlined signs and symptoms  indicating need for more acute intervention.  Patient verbalized understanding and all questions were answered.  Patient received an After-Visit Summary.    No orders of the defined types were placed in this encounter.    Note is dictated utilizing voice recognition software. Although note has been proof read prior to signing, occasional typographical errors still can be missed. If any questions arise, please do not hesitate to call for verification.   electronically signed by:  Howard Pouch, DO  [place]

## 2017-10-16 NOTE — ED Triage Notes (Signed)
Pt states she was sent here by her PCP for possible perirectal abscess. She states it was initially the size of a pimple but is now swollen. Her MD is worried for infection, vitals stable.

## 2017-10-16 NOTE — Patient Instructions (Signed)
I would recommend you report to the ED immediately. The infection of your right buttock is rather deep and large. This will more than likely require immediate labs, imaging, IV antibiotics and surgery.   We will call ahead to let them know you are coming in to [facility] on [address]

## 2017-10-24 ENCOUNTER — Encounter: Payer: Self-pay | Admitting: Family Medicine

## 2017-10-24 ENCOUNTER — Ambulatory Visit: Payer: BC Managed Care – PPO | Admitting: Family Medicine

## 2017-10-24 VITALS — BP 121/61 | HR 50 | Temp 97.9°F | Resp 20 | Wt 205.5 lb

## 2017-10-24 DIAGNOSIS — L03317 Cellulitis of buttock: Secondary | ICD-10-CM

## 2017-10-24 MED ORDER — DOXYCYCLINE HYCLATE 100 MG PO TABS: 100.0000 mg | ORAL_TABLET | Freq: Two times a day (BID) | ORAL | 0 refills | Status: DC

## 2017-10-24 MED ORDER — MUPIROCIN 2 % EX OINT: 1.0000 "application " | TOPICAL_OINTMENT | Freq: Two times a day (BID) | CUTANEOUS | 0 refills | Status: DC

## 2017-10-24 NOTE — Progress Notes (Signed)
Cynthia [name] , [birth year], 61 y.o., female MRN: [id number] Patient Care Team    Relationship Specialty Notifications Start End  Ma Hillock, DO PCP - General Family Medicine  07/14/15   Rosana Berger, MD Consulting Physician Gastroenterology  06/29/15   Melina Schools, MD Consulting Physician Orthopedic Surgery  04/13/16     Chief Complaint  Patient presents with  . Follow-up    abcess     Subjective:  Patient presents for follow-up today after emergency room visit for right buttock cellulitis. Reviewed CT results with patient today which resulted in soft tissue swelling, no abscess noted, cellulitis diagnosis. Patient was placed on clindamycin 150 mg 4 times a day for 7 days. She returns today and states that she feels like it is definitely improved, but is not completely healed. She is concerned he has not improved more and she has an upcoming knee surgery in a few weeks. Patient denies fever, chills or drainage. She endorses the redness and the pain has much improved. She has finished her antibiotics.  Prior note 10/16/2017 Pt presents for an OV with complaints of right buttocks mass and pain that started approximately 6 days ago. Patient reports the area has grown tremendously over this time, is swollen, red and draining a small amount of blood. She is unable to sit on her buttocks, or walk without severe discomfort. She has been taking antibiotic and Tylenol consistently over the last few days including this morning. She is fatigued and feels ill.  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 Last surgery 2 weeks ago.  Depression screen [facility] 2/9 08/16/2017 05/25/2016  Decreased Interest 0 0  Down, Depressed, Hopeless 0 0  PHQ - 2 Score 0 0    No Known Allergies Social History   Tobacco Use  . Smoking status: Never  Smoker  . Smokeless tobacco: Never Used  Substance Use Topics  . Alcohol use: Yes    Alcohol/week: 2.4 oz    Types: 4 Glasses of wine per week   Past Medical History:  Diagnosis Date  . Arthritis   . Cancer ([place])    skin on nose  . Cervical disc disease   . Cystocele   . Heart murmur   . History of adenomatous polyp of colon 06/24/15  . History of rheumatic fever    childhood  . Hypothyroidism   . 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 surveillance  . Mitral valve prolapse   . PONV (postoperative nausea and vomiting)   . Primary osteoarthritis of right hip    Intraarticular steroid injection by Dr. Nelva Bush 01/2016  . Rectocele   . Rectocele   . SA node dysfunction [facility])    Past Surgical History:  Procedure Laterality Date  . ABDOMINAL HYSTERECTOMY  05/1996   TVH--ovaries remain  . ABDOMINAL SACROCOLPOPEXY N/A 06/13/2017   Procedure: ABDOMINO SACROCOLPOPEXY with Halbans culdoplasty;  Surgeon: Nunzio Cobbs, MD;  Location: [place] ORS;  Service: Gynecology;  Laterality: N/A;  . ANTERIOR AND POSTERIOR REPAIR N/A 06/13/2017   Procedure: ANTERIOR (CYSTOCELE) AND POSTERIOR REPAIR (RECTOCELE);  Surgeon: Nunzio Cobbs, MD;  Location: [place] ORS;  Service: Gynecology;  Laterality: N/A;  . arthroscopic knee Right   . BACK SURGERY  2012   disc rupture  .  BILATERAL SALPINGECTOMY Bilateral 06/13/2017   Procedure: BILATERAL SALPINGECTOMY;  Surgeon: Nunzio Cobbs, MD;  Location: [facility] [address] ORS;  Service: Gynecology;  Laterality: Bilateral;  . BLADDER SUSPENSION N/A 06/13/2017   Procedure: TRANSVAGINAL TAPE (TVT) PROCEDURE;  Surgeon: Nunzio Cobbs, MD;  Location: [place] ORS;  Service: Gynecology;  Laterality: N/A;  . COLONOSCOPY W/ POLYPECTOMY  06/24/15   Recall 5 yrs (Dr[facility])  . CYSTOSCOPY N/A 06/13/2017   Procedure: CYSTOSCOPY;  Surgeon: Nunzio Cobbs, MD;  Location: [place] ORS;  Service: Gynecology;   Laterality: N/A;  . KNEE RECONSTRUCTION Right 2008  . LIVER BIOPSY  1997   fatty tumors   Family History  Problem Relation Age of Onset  . Alcohol abuse Mother   . CVA Mother        Dec 65  . Heart disease Mother   . Cancer Father        prostate  . Arthritis Father   . Heart murmur Son   . Alcohol abuse Sister   . Heart disease Brother        congenital heart defect--Dec age 85 from MI  . Heart murmur Son   . Heart murmur Son   . Cancer Maternal Aunt 50       Dec colon CA age 38  . Cancer Cousin 50       Dec colon CA age 36   Allergies as of 10/24/2017   No Known Allergies     Medication List        Accurate as of 10/24/17  5:33 PM. Always use your most recent med list.          aspirin 81 MG tablet Take 81 mg at bedtime by mouth.   CALCIUM 600+D3 600-800 MG-UNIT Tabs Generic drug:  Calcium Carb-Cholecalciferol Take 1 tablet at bedtime by mouth.   conjugated estrogens vaginal cream Commonly known as:  PREMARIN Use 1/2 g vaginally every night at bed time for 2 weeks, then use 1/2 g vaginally two times per week.   docusate sodium 50 MG capsule Commonly known as:  COLACE Take 50 mg at bedtime by mouth.   doxycycline 100 MG tablet Commonly known as:  VIBRA-TABS Take 1 tablet (100 mg total) by mouth 2 (two) times daily.   escitalopram 10 MG tablet Commonly known as:  LEXAPRO Take 1 tablet (10 mg total) by mouth daily.   ibuprofen 200 MG tablet Commonly known as:  ADVIL,MOTRIN Take 400 mg every 6 (six) hours as needed by mouth.   levothyroxine 25 MCG tablet Commonly known as:  LEVOTHROID Take 1 tablet (25 mcg total) by mouth daily before breakfast.   mupirocin ointment 2 % Commonly known as:  BACTROBAN Place 1 application into the nose 2 (two) times daily. Apply to area twice a day.   polyethylene glycol powder powder Commonly known as:  GLYCOLAX/MIRALAX Take 17 g as needed by mouth for moderate constipation.       All past medical history,  surgical history, allergies, family history, immunizations andmedications were updated in the EMR today and reviewed under the history and medication portions of their EMR.     ROS: Negative, with the exception of above mentioned in HPI   Objective:  BP 121/61 (BP Location: Right Arm, Patient Position: Sitting, Cuff Size: Large)   Pulse (!) 50   Temp 97.9 F (36.6 C)   Resp 20   Wt 205 lb 8 oz (93.2 kg)  SpO2 100%   BMI 31.71 kg/m  Body mass index is 31.71 kg/m. Gen: Afebrile. No acute distress. Nontoxic in appearance, well-developed, well-nourished, Caucasian female. HENT: AT. [place].  MMM.  Eyes:Pupils Equal Round Reactive to light, Extraocular movements intact,  Conjunctiva without redness, discharge or icterus. Skin-right buttock: No erythema, no soft tissue swelling. Area of hyperpigmentation and induration with approximately 2 cm open wound with surrounding hyperpigmentation. Mild serosanguineous drainage only. No rashes, purpura or petechiae.     No exam data present No results found. No results found for this or any previous visit (from the past 24 hour(s)).  Assessment/Plan: MALIEA GRANDMAISON is a 61 y.o. female present for OV for  Cellulitis of buttock - Reviewed CT with patient today, likely no abscess formation was present. She appears much improved on exam, however do not feel cellulitis is completely healed. She is starting to experience loose stools with clindamycin use. Will switch therapy to doxycycline for an additional 7 days coverage, along with Bactroban ointment over open area. Patient to keep area clean, dry, covered with bandages changes daily. She is nonadherent dressing is to not pull off scab formation. If not resolved, or ominous completely healed in one week, patient should follow-up may need wound care. Have asked her to inform her surgeon of current wound. As long as healed should not cause an issue with her having upcoming surgery but would want her to be  cautious not to obtain a bed sore in that location given she already has breakdown from the cellulitis. - Patient advised to avoid any pressure to the area, such as sitting. Use of padding/Donut encouraged.  Reviewed expectations re: course of current medical issues.  Discussed self-management of symptoms.  Outlined signs and symptoms indicating need for more acute intervention.  Patient verbalized understanding and all questions were answered.  Patient received an After-Visit Summary.    No orders of the defined types were placed in this encounter.    Note is dictated utilizing voice recognition software. Although note has been proof read prior to signing, occasional typographical errors still can be missed. If any questions arise, please do not hesitate to call for verification.   electronically signed by:  Howard Pouch, DO  [place]

## 2017-10-24 NOTE — Patient Instructions (Signed)
Start doxycyline every 12 hours. Use ointment twice daily with new Band-Aid and Telfa.  Keep area clean dry and padded. Try to keep pressure off area at all times while healing.      Cellulitis, Adult Cellulitis is a skin infection. The infected area is usually red and tender. This condition occurs most often in the arms and lower legs. The infection can travel to the muscles, blood, and underlying tissue and become serious. It is very important to get treated for this condition. What are the causes? Cellulitis is caused by bacteria. The bacteria enter through a break in the skin, such as a cut, burn, insect bite, open sore, or crack. What increases the risk? This condition is more likely to occur in people who:  Have a weak defense system (immune system).  Have open wounds on the skin such as cuts, burns, bites, and scrapes. Bacteria can enter the body through these open wounds.  Are older.  Have diabetes.  Have a type of long-lasting (chronic) liver disease (cirrhosis) or kidney disease.  Use IV drugs.  What are the signs or symptoms? Symptoms of this condition include:  Redness, streaking, or spotting on the skin.  Swollen area of the skin.  Tenderness or pain when an area of the skin is touched.  Warm skin.  Fever.  Chills.  Blisters.  How is this diagnosed? This condition is diagnosed based on a medical history and physical exam. You may also have tests, including:  Blood tests.  Lab tests.  Imaging tests.  How is this treated? Treatment for this condition may include:  Medicines, such as antibiotic medicines or antihistamines.  Supportive care, such as rest and application of cold or warm cloths (cold or warm compresses) to the skin.  Hospital care, if the condition is severe.  The infection usually gets better within 1-2 days of treatment. Follow these instructions at home:  Take over-the-counter and prescription medicines only as told by your  health care provider.  If you were prescribed an antibiotic medicine, take it as told by your health care provider. Do not stop taking the antibiotic even if you start to feel better.  Drink enough fluid to keep your urine clear or pale yellow.  Do not touch or rub the infected area.  Raise (elevate) the infected area above the level of your heart while you are sitting or lying down.  Apply warm or cold compresses to the affected area as told by your health care provider.  Keep all follow-up visits as told by your health care provider. This is important. These visits let your health care provider make sure a more serious infection is not developing. Contact a health care provider if:  You have a fever.  Your symptoms do not improve within 1-2 days of starting treatment.  Your bone or joint underneath the infected area becomes painful after the skin has healed.  Your infection returns in the same area or another area.  You notice a swollen bump in the infected area.  You develop new symptoms.  You have a general ill feeling (malaise) with muscle aches and pains. Get help right away if:  Your symptoms get worse.  You feel very sleepy.  You develop vomiting or diarrhea that persists.  You notice red streaks coming from the infected area.  Your red area gets larger or turns dark in color. This information is not intended to replace advice given to you by your health care provider. Make sure you  discuss any questions you have with your health care provider. Document Released: 08/24/2005 Document Revised: 03/24/2016 Document Reviewed: 09/23/2015 Elsevier Interactive Patient Education  2017 [employer].

## 2017-10-30 ENCOUNTER — Telehealth: Payer: Self-pay | Admitting: Family Medicine

## 2017-10-30 MED ORDER — DOXYCYCLINE HYCLATE 100 MG PO TABS: 100.0000 mg | ORAL_TABLET | Freq: Two times a day (BID) | ORAL | 0 refills | Status: DC

## 2017-10-30 NOTE — Telephone Encounter (Signed)
I am ok extending her course, but ortho will need to continue if they desire after surgery. Refilled prescribed

## 2017-10-30 NOTE — Telephone Encounter (Signed)
Copied from [place] [id number]. Topic: General - Other >> Oct 30, 2017 12:47 PM Darl Householder, RMA wrote: Reason for CRM: medication refill request for doxcycline 100 mg due to patient is having surgery on 11/09/2017 and surgeon is requesting that pt continue antibiotic until surgery date

## 2017-10-30 NOTE — Addendum Note (Signed)
Addended by: Howard Pouch A on: 10/30/2017 04:40 PM   Modules accepted: Orders

## 2017-10-30 NOTE — Telephone Encounter (Signed)
See previous phone note addressed by Dr Raoul Pitch

## 2017-10-31 NOTE — Telephone Encounter (Signed)
Left message on patient voice mail per [facility]

## 2017-11-07 ENCOUNTER — Telehealth: Payer: Self-pay | Admitting: Obstetrics and Gynecology

## 2017-11-07 NOTE — Telephone Encounter (Signed)
Spoke with patient, OV scheduled with Dr. Sabra Heck on 11/08/17 at 12:45pm with Dr. Sabra Heck. Patient is agreeable to date and time.   Routing to provider for final review. Patient is agreeable to disposition. Will close encounter.  Cc: Dr. Quincy Simmonds, Lamont Snowball, RN

## 2017-11-07 NOTE — Telephone Encounter (Signed)
Patient returned call to [facility].

## 2017-11-07 NOTE — Telephone Encounter (Signed)
Patient called to report she has cellulitis and she'd like Dr. Quincy Simmonds to be aware of this. Patient is aware Dr. Quincy Simmonds is on leave.

## 2017-11-07 NOTE — Telephone Encounter (Signed)
Left message to call Breiana Stratmann at [phone].  

## 2017-11-07 NOTE — Telephone Encounter (Addendum)
Spoke with patient. Patient has concerns r/t recent diagnosis of cellulitis, seen in ED on 10/16/17 states she was advised to notify GYN. Patient states knee replacement cancelled by surgeon d/t cellulitis. Patient being followed by PCP, next appt on 11/08/17.   Advised patient Dr. Quincy Simmonds is out of the office d/t family emergency, will review with nursing supervisor and covering physician and return call. Patient is agreeable.   Reviewed with Kandace Blitz, RN -OV recommended with covering provider.

## 2017-11-08 ENCOUNTER — Ambulatory Visit: Payer: BC Managed Care – PPO | Admitting: Obstetrics & Gynecology

## 2017-11-08 ENCOUNTER — Other Ambulatory Visit: Payer: Self-pay

## 2017-11-08 ENCOUNTER — Ambulatory Visit: Payer: BC Managed Care – PPO | Admitting: Family Medicine

## 2017-11-08 ENCOUNTER — Encounter: Payer: Self-pay | Admitting: Obstetrics & Gynecology

## 2017-11-08 VITALS — BP 112/70 | HR 84 | Resp 16 | Ht 67.5 in | Wt 201.0 lb

## 2017-11-08 DIAGNOSIS — L03317 Cellulitis of buttock: Secondary | ICD-10-CM | POA: Diagnosis not present

## 2017-11-08 NOTE — Progress Notes (Signed)
GYNECOLOGY  VISIT  CC:   Buttocks abscess with cellulitis  HPI: 61 y.o. G3P3 Married Caucasian female here for follow up after being diagnosed with buttocks abscess on 10/16/17.  Pt went to ER due to pain.  Was 5cm in size with central necrotic area.  No fluctuation.  White count was 14K.  She was started on clindamycin 150mg  every 6 hours.  CT was done in ER also showing no drainable fluid collections.  NO culture was obtained.    Pt followed up with Dr. Raoul Pitch, PCP, who changed antibiotics to doxycycline.  Note says this was done for 7 days.  Pt states she's been on them for 14 days.  Either way, she finished the antibiotics yesterday.    Pt was scheduled for knee replacement which has been cancelled due to this.  Was advised by surgery center to follow up with [place] (Dr. Quincy Simmonds) because this was "likely" where the bacteria came from due to mesh erosion from sacrocolpopexy.  That surgery was done in July with a small portion of mesh erosion at the vaginal apex that was excised 10/18.  Unlikely that this had anything to do with skin issue on buttocks.  Pt feels the same but was advised to follow up here, so she did.  Also has appt today with PCP for same issue.  Denies fever.  States buttocks is much less tender.  Area is much small.  Has almost no drainage.  Is keeping a dressing on it due to scab over area that eventually opened and drained.    GYNECOLOGIC HISTORY: No LMP recorded. Patient has had a hysterectomy. Contraception: hysterectomy Menopausal hormone therapy: vaginal estrogen therapy  Patient Active Problem List   Diagnosis Date Noted  . MVP (mitral valve prolapse) 03/14/2017  . Family history of aortic aneurysm 03/14/2017  . Osteoarthritis 05/25/2016  . Elevated hemoglobin A1c 05/25/2016  . BMI 34.0-34.9,adult 05/25/2016  . Vitamin D deficiency 05/12/2015  . History of colon polyps 05/27/2014  . Vasomotor flushing 03/17/2014  . Personal history of cardiac arrhythmia 03/17/2014     Past Medical History:  Diagnosis Date  . Arthritis   . Cancer ([place])    skin on nose  . Cervical disc disease   . Cystocele   . Heart murmur   . History of adenomatous polyp of colon 06/24/15  . History of rheumatic fever    childhood  . Hypothyroidism   . 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 surveillance  . Mitral valve prolapse   . PONV (postoperative nausea and vomiting)   . Primary osteoarthritis of right hip    Intraarticular steroid injection by Dr. Nelva Bush 01/2016  . Rectocele   . Rectocele   . SA node dysfunction [facility])     Past Surgical History:  Procedure Laterality Date  . ABDOMINAL HYSTERECTOMY  05/1996   TVH--ovaries remain  . ABDOMINAL SACROCOLPOPEXY N/A 06/13/2017   Procedure: ABDOMINO SACROCOLPOPEXY with Halbans culdoplasty;  Surgeon: Nunzio Cobbs, MD;  Location: [place] [facility];  Service: Gynecology;  Laterality: N/A;  . ANTERIOR AND POSTERIOR REPAIR N/A 06/13/2017   Procedure: ANTERIOR (CYSTOCELE) AND POSTERIOR REPAIR (RECTOCELE);  Surgeon: Nunzio Cobbs, MD;  Location: [place] [facility];  Service: Gynecology;  Laterality: N/A;  . arthroscopic knee Right   . BACK SURGERY  2012   disc rupture  . BILATERAL SALPINGECTOMY Bilateral 06/13/2017   Procedure: BILATERAL SALPINGECTOMY;  Surgeon: Nunzio Cobbs, MD;  Location: [facility] [facility];  Service: Gynecology;  Laterality: Bilateral;  . BLADDER SUSPENSION N/A 06/13/2017   Procedure: TRANSVAGINAL TAPE (TVT) PROCEDURE;  Surgeon: Nunzio Cobbs, MD;  Location: [facility];  Service: Gynecology;  Laterality: N/A;  . COLONOSCOPY W/ POLYPECTOMY  06/24/15   Recall 5 yrs (Dr. [facility])  . CYSTOSCOPY N/A 06/13/2017   Procedure: CYSTOSCOPY;  Surgeon: Nunzio Cobbs, MD;  Location: [facility];  Service: Gynecology;  Laterality: N/A;  . KNEE RECONSTRUCTION Right 2008  . LIVER BIOPSY  1997   fatty tumors    MEDS:   Current Outpatient Medications on File  Prior to Visit  Medication Sig Dispense Refill  . aspirin 81 MG tablet Take 81 mg at bedtime by mouth.     . Calcium Carb-Cholecalciferol (CALCIUM 600+D3) 600-800 MG-UNIT TABS Take 1 tablet at bedtime by mouth.     . conjugated estrogens (PREMARIN) vaginal cream Use 1/2 g vaginally every night at bed time for 2 weeks, then use 1/2 g vaginally two times per week. 30 g 1  . docusate sodium (COLACE) 50 MG capsule Take 50 mg at bedtime by mouth.    . escitalopram (LEXAPRO) 10 MG tablet Take 1 tablet (10 mg total) by mouth daily. (Patient taking differently: Take 10 mg at bedtime by mouth. ) 90 tablet 3  . levothyroxine (LEVOTHROID) 25 MCG tablet Take 1 tablet (25 mcg total) by mouth daily before breakfast. 90 tablet 3  . mupirocin ointment (BACTROBAN) 2 % Place 1 application into the nose 2 (two) times daily. Apply to area twice a day. 22 g 0  . polyethylene glycol powder (GLYCOLAX/MIRALAX) powder Take 17 g as needed by mouth for moderate constipation.      No current facility-administered medications on file prior to visit.     ALLERGIES: Patient has no known allergies.  Family History  Problem Relation Age of Onset  . Alcohol abuse Mother   . CVA Mother        Dec 65  . Heart disease Mother   . Cancer Father        prostate  . Arthritis Father   . Heart murmur Son   . Alcohol abuse Sister   . Heart disease Brother        congenital heart defect--Dec age 43 from MI  . Heart murmur Son   . Heart murmur Son   . Cancer Maternal Aunt 50       Dec colon CA age 52  . Cancer Cousin 24       Dec colon CA age 36    SH:  Non smoker, married  Review of Systems  Constitutional: Negative.   Respiratory: Negative.   Cardiovascular: Negative.   Gastrointestinal: Negative.     PHYSICAL EXAMINATION:    BP 112/70 (BP Location: Right Arm, Patient Position: Sitting, Cuff Size: Large)   Pulse 84   Resp 16   Ht 5' 7.5" (1.715 m)   Wt 201 lb (91.2 kg)   BMI 31.02 kg/m     General  appearance: alert, cooperative and appears stated age Gyn:  NAEFG, no lesions Buttocks:  Small 1.2 x 1.2cm area with scab that is probed.  No drainage.  This is all superficial.  Very minimal surrounding erythema and induration (which is expected at this point in healing).      Chaperone was present for exam.  Assessment: Buttocks abscess, cellulitis, much improved.  Off antibiotics.  Plan: Advised follow up  again with Dr. Raoul Pitch in two weeks to ensure fully resolved.  Would expect it to be mostly resolved by them.  Will have my office call to cancel afternoon appt with her.  Routing note with picture for review.

## 2017-11-29 ENCOUNTER — Ambulatory Visit: Payer: BC Managed Care – PPO | Admitting: Family Medicine

## 2017-11-29 ENCOUNTER — Encounter: Payer: Self-pay | Admitting: Family Medicine

## 2017-11-29 VITALS — BP 92/59 | HR 56 | Temp 97.9°F | Wt 205.4 lb

## 2017-11-29 DIAGNOSIS — E669 Obesity, unspecified: Secondary | ICD-10-CM

## 2017-11-29 DIAGNOSIS — L03317 Cellulitis of buttock: Secondary | ICD-10-CM | POA: Diagnosis not present

## 2017-11-29 NOTE — Patient Instructions (Addendum)
You are healing well.  Completely clear of infection. I am so sorry your knee surgery was cancelled.

## 2017-11-29 NOTE — Progress Notes (Signed)
Cynthia [name] , [birth year], 62 y.o., female MRN: [id number] Patient Care Team    Relationship Specialty Notifications Start End  Ma Hillock, DO PCP - General Family Medicine  07/14/15   Rosana Berger, MD Consulting Physician Gastroenterology  06/29/15   Melina Schools, MD Consulting Physician Orthopedic Surgery  04/13/16     Chief Complaint  Patient presents with  . Cellulitis    follow up     Subjective:  Pt returns today for follow up on cellulitis of right buttock. She unfortunately had her surgery cancelled of her knee secondary to buttock wound. She has been treated with 2 rounds of antibiotics, one being an extended course of doxy for extra coverage surrounding her surgery. She reports the area is completely healed with only a small scab left over. It is not red, swollen or tender.  Prior  Note 11/29/2017: Patient presents for follow-up today after emergency room visit for right buttock cellulitis. Reviewed CT results with patient today which resulted in soft tissue swelling, no abscess noted, cellulitis diagnosis. Patient was placed on clindamycin 150 mg 4 times a day for 7 days. She returns today and states that she feels like it is definitely improved, but is not completely healed. She is concerned he has not improved more and she has an upcoming knee surgery in a few weeks. Patient denies fever, chills or drainage. She endorses the redness and the pain has much improved. She has finished her antibiotics.  Prior note 10/16/2017 Pt presents for an OV with complaints of right buttocks mass and pain that started approximately 6 days ago. Patient reports the area has grown tremendously over this time, is swollen, red and draining a small amount of blood. She is unable to sit on her buttocks, or walk without severe discomfort. She has been taking antibiotic and Tylenol consistently over the last few days including this morning. She is fatigued and feels ill.  Patient has had a  history of anterior and posterior repair, abdominal sacral plexi with bladder suspension in July. 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 Last surgery 2 weeks ago.  Depression screen [facility] 2/9 08/16/2017 05/25/2016  Decreased Interest 0 0  Down, Depressed, Hopeless 0 0  PHQ - 2 Score 0 0    No Known Allergies Social History   Tobacco Use  . Smoking status: Never Smoker  . Smokeless tobacco: Never Used  Substance Use Topics  . Alcohol use: Yes    Alcohol/week: 2.4 oz    Types: 4 Glasses of wine per week   Past Medical History:  Diagnosis Date  . Arthritis   . Cancer ([place])    skin on nose  . Cervical disc disease   . Cystocele   . Heart murmur   . History of adenomatous polyp of colon 06/24/15  . History of rheumatic fever    childhood  . Hypothyroidism   . 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 surveillance  . Mitral valve prolapse   . PONV (postoperative nausea and vomiting)   . Primary osteoarthritis of right hip    Intraarticular steroid injection by Dr. Nelva Bush 01/2016  . Rectocele   . Rectocele   . SA node dysfunction [facility])    Past Surgical History:  Procedure Laterality Date  . ABDOMINAL HYSTERECTOMY  05/1996   TVH--ovaries remain  . ABDOMINAL SACROCOLPOPEXY N/A 06/13/2017   Procedure: ABDOMINO SACROCOLPOPEXY with  Halbans culdoplasty;  Surgeon: Nunzio Cobbs, MD;  Location: [place] [facility];  Service: Gynecology;  Laterality: N/A;  . ANTERIOR AND POSTERIOR REPAIR N/A 06/13/2017   Procedure: ANTERIOR (CYSTOCELE) AND POSTERIOR REPAIR (RECTOCELE);  Surgeon: Nunzio Cobbs, MD;  Location: [place] [facility] [facility];  Service: Gynecology;  Laterality: N/A;  . arthroscopic knee Right   . BACK SURGERY  2012   disc rupture  . BILATERAL SALPINGECTOMY Bilateral 06/13/2017   Procedure: BILATERAL SALPINGECTOMY;  Surgeon: Nunzio Cobbs, MD;  Location: [facility] [facility];  Service: Gynecology;  Laterality: Bilateral;  .  BLADDER SUSPENSION N/A 06/13/2017   Procedure: TRANSVAGINAL TAPE (TVT) PROCEDURE;  Surgeon: Nunzio Cobbs, MD;  Location: [facility];  Service: Gynecology;  Laterality: N/A;  . COLONOSCOPY W/ POLYPECTOMY  06/24/15   Recall 5 yrs (Dr. [facility])  . CYSTOSCOPY N/A 06/13/2017   Procedure: CYSTOSCOPY;  Surgeon: Nunzio Cobbs, MD;  Location: [place] [facility];  Service: Gynecology;  Laterality: N/A;  . KNEE RECONSTRUCTION Right 2008  . LIVER BIOPSY  1997   fatty tumors   Family History  Problem Relation Age of Onset  . Alcohol abuse Mother   . CVA Mother        Dec 65  . Heart disease Mother   . Cancer Father        prostate  . Arthritis Father   . Heart murmur Son   . Alcohol abuse Sister   . Heart disease Brother        congenital heart defect--Dec age 107 from MI  . Heart murmur Son   . Heart murmur Son   . Cancer Maternal Aunt 50       Dec colon CA age 45  . Cancer Cousin 50       Dec colon CA age 58   Allergies as of 11/29/2017   No Known Allergies     Medication List        Accurate as of 11/29/17  8:27 AM. Always use your most recent med list.          aspirin 81 MG tablet Take 81 mg at bedtime by mouth.   CALCIUM 600+D3 600-800 MG-UNIT Tabs Generic drug:  Calcium Carb-Cholecalciferol Take 1 tablet at bedtime by mouth.   conjugated estrogens vaginal cream Commonly known as:  PREMARIN Use 1/2 g vaginally every night at bed time for 2 weeks, then use 1/2 g vaginally two times per week.   docusate sodium 50 MG capsule Commonly known as:  COLACE Take 50 mg at bedtime by mouth.   escitalopram 10 MG tablet Commonly known as:  LEXAPRO Take 1 tablet (10 mg total) by mouth daily.   levothyroxine 25 MCG tablet Commonly known as:  LEVOTHROID Take 1 tablet (25 mcg total) by mouth daily before breakfast.   mupirocin ointment 2 % Commonly known as:  BACTROBAN Place 1 application into the nose 2 (two) times daily. Apply to area twice a  day.   polyethylene glycol powder powder Commonly known as:  GLYCOLAX/MIRALAX Take 17 g as needed by mouth for moderate constipation.       All past medical history, surgical history, allergies, family history, immunizations andmedications were updated in the EMR today and reviewed under the history and medication portions of their EMR.     ROS: Negative, with the exception of above mentioned in HPI   Objective:  BP (!) 92/59 (BP Location: Left Arm, Patient Position: Sitting,  Cuff Size: Large)   Pulse (!) 56   Temp 97.9 F (36.6 C) (Oral)   Wt 205 lb 6.4 oz (93.2 kg)   SpO2 98%   BMI 31.70 kg/m  Body mass index is 31.7 kg/m. Gen: Afebrile. No acute distress. Nontoxic. Skin: No erythema, swelling or tenderness. Small <0.5 cm scab formation remains over central hyperpigmented area of prior abscess. No drainage. No rashes, purpura or petechiae.  Neuro: Normal gait. PERLA. EOMi. Alert. Oriented x3     No exam data present No results found. No results found for this or any previous visit (from the past 24 hour(s)).  Assessment/Plan: Cynthia [name] is a 62 y.o. female present for OV for  Cellulitis of buttock/obesity - cellulitis resolved. Area healing well. Very small scab formation present centrally. No signs of infection. Unfortunate her surgery was cancelled, and she will not be able to financially afford at this time. - Continue to monitor area.Let scab fall off on its own.  - F/U PRN if signs of infection return.    Reviewed expectations re: course of current medical issues.  Discussed self-management of symptoms.  Outlined signs and symptoms indicating need for more acute intervention.  Patient verbalized understanding and all questions were answered.  Patient received an After-Visit Summary.   > 15 minutes spent with patient, >50% of time spent face to face concerning medical condition  No orders of the defined types were placed in this  encounter.    Note is dictated utilizing voice recognition software. Although note has been proof read prior to signing, occasional typographical errors still can be missed. If any questions arise, please do not hesitate to call for verification.   electronically signed by:  Howard Pouch, DO  [place]

## 2017-12-21 NOTE — Progress Notes (Signed)
GYNECOLOGY  VISIT   HPI: 62 y.o.   Married  Caucasian  female   G3P3 with No LMP recorded. Patient has had a hysterectomy.   here for 3 month follow up.   Recent right buttock abscess.  No wound culture was done.  States sometimes it will scab over and then open again.   Starting care with Ileana Roup now for pelvic floor therapy.  Still struggling with having BMs.  Increasing fluids and fiber. Using Colace daily.  Used to doing manual manipulation to have BMS for 20 years.   GYNECOLOGIC HISTORY: No LMP recorded. Patient has had a hysterectomy. Contraception:  Hysterectomy Menopausal hormone therapy:  Premarin cream Last mammogram:  01-17-17 Density B/Neg/BiRads1:TBC Last pap smear: years ago, normal per patient        OB History    Gravida Para Term Preterm AB Living   3 3       3    SAB TAB Ectopic Multiple Live Births           3         Patient Active Problem List   Diagnosis Date Noted  . MVP (mitral valve prolapse) 03/14/2017  . Family history of aortic aneurysm 03/14/2017  . Osteoarthritis 05/25/2016  . Elevated hemoglobin A1c 05/25/2016  . Obesity (BMI 30-39.9) 05/25/2016  . Vitamin D deficiency 05/12/2015  . History of colon polyps 05/27/2014  . Vasomotor flushing 03/17/2014  . Personal history of cardiac arrhythmia 03/17/2014    Past Medical History:  Diagnosis Date  . Arthritis   . Cancer ([place])    skin on nose  . Cervical disc disease   . Cystocele   . Heart murmur   . History of adenomatous polyp of colon 06/24/15  . History of rheumatic fever    childhood  . Hypothyroidism   . 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 surveillance  . Mitral valve prolapse   . PONV (postoperative nausea and vomiting)   . Primary osteoarthritis of right hip    Intraarticular steroid injection by Dr. Nelva Bush 01/2016  . Rectocele   . Rectocele   . SA node dysfunction [facility])     Past Surgical History:  Procedure Laterality Date  . ABDOMINAL  HYSTERECTOMY  05/1996   TVH--ovaries remain  . ABDOMINAL SACROCOLPOPEXY N/A 06/13/2017   Procedure: ABDOMINO SACROCOLPOPEXY with Halbans culdoplasty;  Surgeon: Nunzio Cobbs, MD;  Location: [place] [facility];  Service: Gynecology;  Laterality: N/A;  . ANTERIOR AND POSTERIOR REPAIR N/A 06/13/2017   Procedure: ANTERIOR (CYSTOCELE) AND POSTERIOR REPAIR (RECTOCELE);  Surgeon: Nunzio Cobbs, MD;  Location: [facility] [facility];  Service: Gynecology;  Laterality: N/A;  . arthroscopic knee Right   . BACK SURGERY  2012   disc rupture  . BILATERAL SALPINGECTOMY Bilateral 06/13/2017   Procedure: BILATERAL SALPINGECTOMY;  Surgeon: Nunzio Cobbs, MD;  Location: [place] [facility];  Service: Gynecology;  Laterality: Bilateral;  . BLADDER SUSPENSION N/A 06/13/2017   Procedure: TRANSVAGINAL TAPE (TVT) PROCEDURE;  Surgeon: Nunzio Cobbs, MD;  Location: [place] [facility];  Service: Gynecology;  Laterality: N/A;  . COLONOSCOPY W/ POLYPECTOMY  06/24/15   Recall 5 yrs (Dr. [facility])  . CYSTOSCOPY N/A 06/13/2017   Procedure: CYSTOSCOPY;  Surgeon: Nunzio Cobbs, MD;  Location: [place] [facility];  Service: Gynecology;  Laterality: N/A;  . KNEE RECONSTRUCTION Right 2008  . LIVER BIOPSY  1997   fatty tumors  Current Outpatient Medications  Medication Sig Dispense Refill  . aspirin 81 MG tablet Take 81 mg at bedtime by mouth.     . Calcium Carb-Cholecalciferol (CALCIUM 600+D3) 600-800 MG-UNIT TABS Take 1 tablet at bedtime by mouth.     . conjugated estrogens (PREMARIN) vaginal cream Use 1/2 g vaginally every night at bed time for 2 weeks, then use 1/2 g vaginally two times per week. 30 g 1  . escitalopram (LEXAPRO) 10 MG tablet Take 1 tablet (10 mg total) by mouth daily. (Patient taking differently: Take 10 mg at bedtime by mouth. ) 90 tablet 3  . levothyroxine (LEVOTHROID) 25 MCG tablet Take 1 tablet (25 mcg total) by mouth daily before breakfast. 90 tablet 3   No current  facility-administered medications for this visit.      ALLERGIES: Patient has no known allergies.  Family History  Problem Relation Age of Onset  . Alcohol abuse Mother   . CVA Mother        Dec 65  . Heart disease Mother   . Cancer Father        prostate  . Arthritis Father   . Heart murmur Son   . Alcohol abuse Sister   . Heart disease Brother        congenital heart defect--Dec age 68 from MI  . Heart murmur Son   . Heart murmur Son   . Cancer Maternal Aunt 50       Dec colon CA age 15  . Cancer Cousin 75       Dec colon CA age 41    Social History   Socioeconomic History  . Marital status: Married    Spouse name: [name]  . Number of children: 3  . Years of education: Not on file  . Highest education level: Not on file  Social Needs  . Financial resource strain: Not on file  . Food insecurity - worry: Not on file  . Food insecurity - inability: Not on file  . Transportation needs - medical: Not on file  . Transportation needs - non-medical: Not on file  Occupational History  . Occupation: Product manager: [employer]  Tobacco Use  . Smoking status: Never Smoker  . Smokeless tobacco: Never Used  Substance and Sexual Activity  . Alcohol use: Yes    Alcohol/week: 2.4 oz    Types: 4 Glasses of wine per week  . Drug use: No  . Sexual activity: No    Birth control/protection: Other-see comments, Surgical    Comment: Hyst  Other Topics Concern  . Not on file  Social History Narrative   Ms. Cashatt lives in [place] with her husband.  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 She is from [place].    ROS:  Pertinent items are noted in HPI.  PHYSICAL EXAMINATION:    BP 116/62 (BP Location: Right Arm, Patient Position: Sitting, Cuff Size: Normal)   Pulse 76   Resp 16   Wt 203 lb (92.1 kg)   BMI 31.33 kg/m     General appearance: alert, cooperative and appears stated  age  Pelvic: External genitalia:  no lesions              Urethra:  normal appearing urethra with no masses, tenderness or lesions              Bartholins and Skenes: normal  Vagina: normal appearing vagina with normal color and discharge, no lesions.  Asymmetrical left rectocele.               Cervix:  Absent.  Small area of exposure of mesh at vaginal apex and minimal granulation tissue treated with AgNO3.                 Bimanual Exam:  Uterus:   Absent.               Adnexa: no mass, fullness, tenderness        Right buttock - scar noted.  No fluctuance, drainage, mass or tenderness.  Chaperone was present for exam.  ASSESSMENT  Status post sacrocolpopexy and prolapse repair.  Asymmetrical rectocele.  Small exposure of vaginal apical mesh.  Not a problem for the patient.  Recent buttock abscess.  No culture done.   PLAN  I discussed her rectocele.  Will plan for physical therapy, dietary control, and stool softeners.  I did mention a posterior colporrhaphy with biologic mesh placement as a possibility for the future if needed.  Follow up in 3 months.  Patient in agreement with the plan.     An After Visit Summary was printed and given to the patient.  ___15___ minutes face to face time of which over 50% was spent in counseling.

## 2017-12-25 ENCOUNTER — Ambulatory Visit (INDEPENDENT_AMBULATORY_CARE_PROVIDER_SITE_OTHER): Payer: BC Managed Care – PPO | Admitting: Obstetrics and Gynecology

## 2017-12-25 ENCOUNTER — Other Ambulatory Visit: Payer: Self-pay

## 2017-12-25 ENCOUNTER — Encounter: Payer: Self-pay | Admitting: Obstetrics and Gynecology

## 2017-12-25 VITALS — BP 116/62 | HR 76 | Resp 16 | Wt 203.0 lb

## 2017-12-25 DIAGNOSIS — N816 Rectocele: Secondary | ICD-10-CM | POA: Diagnosis not present

## 2018-01-05 ENCOUNTER — Ambulatory Visit: Payer: BC Managed Care – PPO | Admitting: Obstetrics and Gynecology

## 2018-02-12 ENCOUNTER — Other Ambulatory Visit: Payer: Self-pay | Admitting: Obstetrics and Gynecology

## 2018-02-13 ENCOUNTER — Telehealth: Payer: Self-pay | Admitting: Obstetrics and Gynecology

## 2018-02-13 NOTE — Telephone Encounter (Signed)
See telephone encounter dated 02/13/18.

## 2018-02-13 NOTE — Telephone Encounter (Signed)
Patient returned a call to [place]. Patient said she has not had a  MMG this year and does not have one scheduled.

## 2018-02-13 NOTE — Telephone Encounter (Signed)
Message left to return call to [facility] at [phone].    Need to advise patient Dr. Quincy Simmonds will not refill premarin until she has received MMG. Need to have patient schedule MMG.

## 2018-02-13 NOTE — Telephone Encounter (Signed)
Medication refill request: premarin  Last OV:  12/25/17 BS  Next OV : 03/28/18  Last MMG (if hormonal medication request): 01/17/17 density b/BIRADS 1 negative. Detailed message left for patient to return call to update if she has had her MMG this year or if it is scheduled  Refill authorized: 07/21/17 30g, 1RF. Today, please advise.

## 2018-02-27 ENCOUNTER — Ambulatory Visit: Payer: BC Managed Care – PPO | Admitting: Family Medicine

## 2018-02-28 NOTE — Telephone Encounter (Signed)
Detailed message left on mobile number per DPR advising patient Dr. Quincy Simmonds would like to receive MMG results before refilling estrogen. Advised to return call if she needs help in scheduling MMG.

## 2018-03-05 ENCOUNTER — Other Ambulatory Visit: Payer: Self-pay | Admitting: Obstetrics and Gynecology

## 2018-03-05 DIAGNOSIS — Z1231 Encounter for screening mammogram for malignant neoplasm of breast: Secondary | ICD-10-CM

## 2018-03-05 NOTE — Telephone Encounter (Signed)
Patient returned call. Message given to patient as seen below from Dr. Quincy Simmonds. Patient states she does her MMG every 2 years. RN advised per Dr. Quincy Simmonds, will not be able to refill premarin until MMG results are in. 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 RN offered to schedule MMG for patient and she is agreeable. RN advised would call [facility] to schedule MMG and will return call to patient. Patient agreeable.

## 2018-03-05 NOTE — Telephone Encounter (Signed)
Call to patient. Advised patient RN had called [facility] and MMG is scheduled for Wednesday 03/28/18 at 1620. Patient agreeable to date and time of appointment.   Patient agreeable to disposition. Will close encounter.

## 2018-03-06 ENCOUNTER — Telehealth: Payer: Self-pay | Admitting: Family Medicine

## 2018-03-06 DIAGNOSIS — E2839 Other primary ovarian failure: Secondary | ICD-10-CM

## 2018-03-06 NOTE — Telephone Encounter (Signed)
Pt has an appt for 4/10 late afternoon for a mychart message of needing a bone density order and a tdap.  - pt has had her CPE here 08/16/2017. She was told to consider bone density when she has her next mammogram (which is ordered by her GYN). It looks like she had her MAM yesterday? I am uncertain the surroundings of her needing an order from here for her bone density (maybe her GYN does not do them in office with mammogram?).  However her CPE is UTD here, so if she decided she did want the dexa and wants [place] to order, I am happy to do it for her without a provider appt, but she would have to let us know where and I can not order it to be completed at her GYN office (that would come from her GYN).  Also ok to give TDAP immunization update by nurse visit. This was also addressed at CPE and due after 01/2018.  If she is desiring to see me, of course I will see her, but neither of those 2 orders need a provider appt since her CPE is UTD and these were discussed.  Please advise

## 2018-03-07 ENCOUNTER — Ambulatory Visit (INDEPENDENT_AMBULATORY_CARE_PROVIDER_SITE_OTHER): Payer: BC Managed Care – PPO

## 2018-03-07 DIAGNOSIS — Z23 Encounter for immunization: Secondary | ICD-10-CM | POA: Diagnosis not present

## 2018-03-07 DIAGNOSIS — E2839 Other primary ovarian failure: Secondary | ICD-10-CM

## 2018-03-07 NOTE — Progress Notes (Addendum)
Pt presented to our Department for her Tdap Immunization Shot. Pt tolerated meds well with no complain.  Medical screening examination/treatment/procedure(s) were performed by non-physician practitioner and as supervising physician I was immediately available for consultation/collaboration.  I agree with above assessment and plan.  Electronically Signed by: Howard Pouch, DO [facility] [place]

## 2018-03-07 NOTE — Telephone Encounter (Signed)
Patient only needs order for Bone density scan and will come in as nurse visit for Tdap .She states she doesn't have any issues to address today with Dr Raoul Pitch appt changed to nurse visit and order for Bone Density placed.

## 2018-03-07 NOTE — Telephone Encounter (Signed)
Left detailed message requesting patient to call the office for clarification on her requests.

## 2018-03-08 ENCOUNTER — Telehealth: Payer: Self-pay | Admitting: Family Medicine

## 2018-03-08 NOTE — Telephone Encounter (Signed)
noted 

## 2018-03-08 NOTE — Telephone Encounter (Signed)
Left message for patient that she has an order in for a bone density test that needs to be scheduled. She has an appointment for a mammogram 03/28/18. Unfortunately I was unable to pair the two. The mammogram was scheduled before the bone density order was placed. Patient can call [phone] to schedule the bone density and have them move the mammogram if that works better for her.

## 2018-03-13 NOTE — Telephone Encounter (Signed)
Patient scheduled for both exams 04/10/18

## 2018-03-15 ENCOUNTER — Encounter: Payer: Self-pay | Admitting: Family Medicine

## 2018-03-15 NOTE — Telephone Encounter (Signed)
This encounter was created in error - please disregard.

## 2018-03-28 ENCOUNTER — Ambulatory Visit: Payer: BC Managed Care – PPO

## 2018-03-28 ENCOUNTER — Encounter: Payer: Self-pay | Admitting: Obstetrics and Gynecology

## 2018-03-28 ENCOUNTER — Ambulatory Visit: Payer: BC Managed Care – PPO | Admitting: Obstetrics and Gynecology

## 2018-03-28 ENCOUNTER — Other Ambulatory Visit: Payer: Self-pay

## 2018-03-28 VITALS — BP 116/60 | HR 68 | Resp 16 | Ht 68.0 in | Wt 207.0 lb

## 2018-03-28 DIAGNOSIS — N816 Rectocele: Secondary | ICD-10-CM

## 2018-03-28 NOTE — Progress Notes (Signed)
GYNECOLOGY  VISIT   HPI: 62 y.o.   Married  Caucasian  female   G3P3 with Patient's last menstrual period was 03/28/1996 (within months).   here for 3 month recheck.  Feels the rectocele is returning.  Doing vaginal splinting and the stool will not come out as easily as previously. Using OTC stool softener once at night.  Thinks she needs to increase this.  Using prune in morning shake.   If does more prune, has runny stool.  Doing PT with Ileana Roup.   GYNECOLOGIC HISTORY: Patient's last menstrual period was 03/28/1996 (within months). Contraception:  Hysterectomy Menopausal hormone therapy:  Premarin cream Last mammogram: 01-17-17 Density B/Neg/BiRads1 - Scheduled 04/10/18 -- TBC Last pap smear:   Years ago, normal per patient        OB History    Gravida  3   Para  3   Term      Preterm      AB      Living  3     SAB      TAB      Ectopic      Multiple      Live Births  3              Patient Active Problem List   Diagnosis Date Noted  . MVP (mitral valve prolapse) 03/14/2017  . Family history of aortic aneurysm 03/14/2017  . Osteoarthritis 05/25/2016  . Elevated hemoglobin A1c 05/25/2016  . Obesity (BMI 30-39.9) 05/25/2016  . Vitamin D deficiency 05/12/2015  . History of colon polyps 05/27/2014  . Vasomotor flushing 03/17/2014  . Personal history of cardiac arrhythmia 03/17/2014    Past Medical History:  Diagnosis Date  . Arthritis   . Cancer ([place])    skin on nose  . Cervical disc disease   . Cystocele   . Heart murmur   . History of adenomatous polyp of colon 06/24/15  . History of rheumatic fever    childhood  . Hypothyroidism   . 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 surveillance  . Mitral valve prolapse   . PONV (postoperative nausea and vomiting)   . Primary osteoarthritis of right hip    Intraarticular steroid injection by Dr. Nelva Bush 01/2016  . Rectocele   . Rectocele   . SA node dysfunction [facility])      Past Surgical History:  Procedure Laterality Date  . ABDOMINAL HYSTERECTOMY  05/1996   TVH--ovaries remain  . ABDOMINAL SACROCOLPOPEXY N/A 06/13/2017   Procedure: ABDOMINO SACROCOLPOPEXY with Halbans culdoplasty;  Surgeon: Nunzio Cobbs, MD;  Location: [facility];  Service: Gynecology;  Laterality: N/A;  . ANTERIOR AND POSTERIOR REPAIR N/A 06/13/2017   Procedure: ANTERIOR (CYSTOCELE) AND POSTERIOR REPAIR (RECTOCELE);  Surgeon: Nunzio Cobbs, MD;  Location: [facility];  Service: Gynecology;  Laterality: N/A;  . arthroscopic knee Right   . BACK SURGERY  2012   disc rupture  . BILATERAL SALPINGECTOMY Bilateral 06/13/2017   Procedure: BILATERAL SALPINGECTOMY;  Surgeon: Nunzio Cobbs, MD;  Location: [facility];  Service: Gynecology;  Laterality: Bilateral;  . BLADDER SUSPENSION N/A 06/13/2017   Procedure: TRANSVAGINAL TAPE (TVT) PROCEDURE;  Surgeon: Nunzio Cobbs, MD;  Location: [facility];  Service: Gynecology;  Laterality: N/A;  . COLONOSCOPY W/ POLYPECTOMY  06/24/15   Recall 5 yrs (Dr. [facility])  . CYSTOSCOPY N/A 06/13/2017   Procedure: CYSTOSCOPY;  Surgeon:  Nunzio Cobbs, MD;  Location: [facility];  Service: Gynecology;  Laterality: N/A;  . KNEE RECONSTRUCTION Right 2008  . LIVER BIOPSY  1997   fatty tumors    Current Outpatient Medications  Medication Sig Dispense Refill  . aspirin 81 MG tablet Take 81 mg at bedtime by mouth.     . Calcium Carb-Cholecalciferol (CALCIUM 600+D3) 600-800 MG-UNIT TABS Take 1 tablet at bedtime by mouth.     . conjugated estrogens (PREMARIN) vaginal cream Use 1/2 g vaginally every night at bed time for 2 weeks, then use 1/2 g vaginally two times per week. 30 g 1  . escitalopram (LEXAPRO) 10 MG tablet Take 1 tablet (10 mg total) by mouth daily. (Patient taking differently: Take 10 mg at bedtime by mouth. ) 90 tablet 3  . levothyroxine (LEVOTHROID) 25 MCG tablet Take 1 tablet (25 mcg total) by  mouth daily before breakfast. 90 tablet 3   No current facility-administered medications for this visit.      ALLERGIES: Patient has no known allergies.  Family History  Problem Relation Age of Onset  . Alcohol abuse Mother   . CVA Mother        Dec 65  . Heart disease Mother   . Cancer Father        prostate  . Arthritis Father   . Heart murmur Son   . Alcohol abuse Sister   . Heart disease Brother        congenital heart defect--Dec age 54 from MI  . Heart murmur Son   . Heart murmur Son   . Cancer Maternal Aunt 50       Dec colon CA age 58  . Cancer Cousin 90       Dec colon CA age 53    Social History   Socioeconomic History  . Marital status: Married    Spouse name: [name]  . Number of children: 3  . Years of education: Not on file  . Highest education level: Not on file  Occupational History  . Occupation: Product manager: [employer]  Social Needs  . Financial resource strain: Not on file  . Food insecurity:    Worry: Not on file    Inability: Not on file  . Transportation needs:    Medical: Not on file    Non-medical: Not on file  Tobacco Use  . Smoking status: Never Smoker  . Smokeless tobacco: Never Used  Substance and Sexual Activity  . Alcohol use: Yes    Alcohol/week: 2.4 oz    Types: 4 Glasses of wine per week  . Drug use: No  . Sexual activity: Not Currently    Birth control/protection: Other-see comments, Surgical    Comment: Hyst  Lifestyle  . Physical activity:    Days per week: Not on file    Minutes per session: Not on file  . Stress: Not on file  Relationships  . Social connections:    Talks on phone: Not on file    Gets together: Not on file    Attends religious service: Not on file    Active member of club or organization: Not on file    Attends meetings of clubs or organizations: Not on file    Relationship status: Not on file  . Intimate partner violence:    Fear of current or ex partner: Not on file     Emotionally abused: Not on file    Physically  abused: Not on file    Forced sexual activity: Not on file  Other Topics Concern  . Not on file  Social History Narrative   Ms. Pinkerton lives in [place] with her husband.  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 She is from [place].    ROS:  Pertinent items are noted in HPI.  PHYSICAL EXAMINATION:    BP 116/60 (BP Location: Right Arm, Patient Position: Sitting, Cuff Size: Large)   Pulse 68   Resp 16   Ht 5\' 8"  (1.727 m)   Wt 207 lb (93.9 kg)   LMP 03/28/1996 (Within Months)   BMI 31.47 kg/m     General appearance: alert, cooperative and appears stated age    Pelvic: External genitalia:  no lesions              Urethra:  normal appearing urethra with no masses, tenderness or lesions              Bartholins and Skenes: normal                 Vagina: normal appearing vagina with normal color and discharge, no lesions              Cervix: absent.  5 mm area of mesh exposure at vaginal apex.  Suture knot apparent but not coming through mucosa.   Second degree rectocele.   Asymmetrical.  Left greater than right. Midvagina.                Bimanual Exam:  Uterus:   absent              Adnexa: no mass, fullness, tenderness              Rectal exam: Yes.  .  Confirms.              Anus:  normal sphincter tone, no lesions  Chaperone was present for exam.  ASSESSMENT  Status post sacrocolpopexy and prolapse repair.  Asymmetrical rectocele.  Small exposure of vaginal apical mesh.     PLAN  We discussed recurrence of the rectocele and options for care:  Metamucil, pessary, native tissue repair, referral to [facility] for consultation regarding graft of the posterior vaginal prolapse.  Could also have excision of the exposed sacrocolpopexy mesh at the same time.  FU prn.    An After Visit Summary was printed and given to the patient.  ___15___ minutes face to  face time of which over 50% was spent in counseling.

## 2018-04-10 ENCOUNTER — Ambulatory Visit: Payer: BC Managed Care – PPO

## 2018-04-10 ENCOUNTER — Other Ambulatory Visit: Payer: BC Managed Care – PPO

## 2018-05-10 ENCOUNTER — Ambulatory Visit
Admission: RE | Admit: 2018-05-10 | Discharge: 2018-05-10 | Disposition: A | Discharge status: Home / Self Care | Payer: BC Managed Care – PPO | Source: Ambulatory Visit | Attending: Family Medicine | Admitting: Family Medicine

## 2018-05-10 ENCOUNTER — Ambulatory Visit
Admission: RE | Admit: 2018-05-10 | Discharge: 2018-05-10 | Disposition: A | Discharge status: Home / Self Care | Payer: BC Managed Care – PPO | Source: Ambulatory Visit | Attending: Obstetrics and Gynecology | Admitting: Obstetrics and Gynecology

## 2018-05-10 DIAGNOSIS — E2839 Other primary ovarian failure: Secondary | ICD-10-CM

## 2018-05-10 DIAGNOSIS — Z1231 Encounter for screening mammogram for malignant neoplasm of breast: Secondary | ICD-10-CM

## 2018-06-20 ENCOUNTER — Ambulatory Visit: Payer: BC Managed Care – PPO | Admitting: Family Medicine

## 2018-06-20 ENCOUNTER — Encounter: Payer: Self-pay | Admitting: Family Medicine

## 2018-06-20 VITALS — BP 108/71 | HR 58 | Temp 97.5°F | Resp 20 | Ht 68.0 in | Wt 211.5 lb

## 2018-06-20 DIAGNOSIS — Z9109 Other allergy status, other than to drugs and biological substances: Secondary | ICD-10-CM

## 2018-06-20 DIAGNOSIS — H9203 Otalgia, bilateral: Secondary | ICD-10-CM | POA: Diagnosis not present

## 2018-06-20 DIAGNOSIS — M159 Polyosteoarthritis, unspecified: Secondary | ICD-10-CM

## 2018-06-20 MED ORDER — FLUTICASONE PROPIONATE 50 MCG/ACT NA SUSP: 2.0000 | Freq: Every day | NASAL | 6 refills | Status: DC

## 2018-06-20 MED ORDER — MELOXICAM 15 MG PO TABS: 15.0000 mg | ORAL_TABLET | Freq: Every day | ORAL | 3 refills | Status: DC

## 2018-06-20 NOTE — Patient Instructions (Signed)
Start mobic daily with food for arthritis.  Start flonase and +/- daily OTC allergy pill for ear/eye symptoms. --> this sounds allergy related.   Prescribed flonase and mobic.     Allergies An allergy is when your body reacts to a substance in a way that is not normal. An allergic reaction can happen after you:  Eat something.  Breathe in something.  Touch something.  You can be allergic to:  Things that are only around during certain seasons, like molds and pollens.  Foods.  Drugs.  Insects.  Animal dander.  What are the signs or symptoms?  Puffiness (swelling). This may happen on the lips, face, tongue, mouth, or throat.  Sneezing.  Coughing.  Breathing loudly (wheezing).  Stuffy nose.  Tingling in the mouth.  A rash.  Itching.  Itchy, red, puffy areas of skin (hives).  Watery eyes.  Throwing up (vomiting).  Watery poop (diarrhea).  Dizziness.  Feeling faint or fainting.  Trouble breathing or swallowing.  A tight feeling in the chest.  A fast heartbeat. How is this diagnosed? Allergies can be diagnosed with:  A medical and family history.  Skin tests.  Blood tests.  A food diary. A food diary is a record of all the foods, drinks, and symptoms you have each day.  The results of an elimination diet. This diet involves making sure not to eat certain foods and then seeing what happens when you start eating them again.  How is this treated? There is no cure for allergies, but allergic reactions can be treated with medicine. Severe reactions usually need to be treated at a hospital. How is this prevented? The best way to prevent an allergic reaction is to avoid the thing you are allergic to. Allergy shots and medicines can also help prevent reactions in some cases. This information is not intended to replace advice given to you by your health care provider. Make sure you discuss any questions you have with your health care  provider. Document Released: 03/11/2013 Document Revised: 07/11/2016 Document Reviewed: 08/26/2014 Elsevier Interactive Patient Education  [employer].

## 2018-06-20 NOTE — Progress Notes (Signed)
Cynthia [name] , 31-Mar-1956, 62 y.o., female MRN: [id number] Patient Care Team    Relationship Specialty Notifications Start End  Ma Hillock, DO PCP - General Family Medicine  07/14/15   Rosana Berger, MD Consulting Physician Gastroenterology  06/29/15   Melina Schools, MD Consulting Physician Orthopedic Surgery  04/13/16     Chief Complaint  Patient presents with  . Ear Pain    bilateral x 3 days     Subjective: Pt presents for an OV with complaints of bilateral ear pain and popping of 3 days duration.  Associated symptoms include itchy watery eyes. She denies URI symptoms or fever. She has tied nothing for the discomfort. She normally does not have allergies.   Arthritis: She also complains of daily bilateral hip pain that improves as the day goes on. She has had lumbar disc rupture in 2012 with surgery. She states she gets a mild flare of back pain 2-3x a year, that usually occurs if she over works it and resolves with OTC tx and ice. She reports this past weekend she had a flare, and she did not do anything that she can recall to bring it on. Pain radiated from her lower back midline to her bilateral hips. She has been taken IBF and it is improving but she is wondering if she should go back to ortho to have it looked at.   Depression screen [facility] 2/9 06/20/2018 08/16/2017 05/25/2016  Decreased Interest 0 0 0  Down, Depressed, Hopeless 0 0 0  PHQ - 2 Score 0 0 0    No Known Allergies Social History   Tobacco Use  . Smoking status: Never Smoker  . Smokeless tobacco: Never Used  Substance Use Topics  . Alcohol use: Yes    Alcohol/week: 2.4 oz    Types: 4 Glasses of wine per week   Past Medical History:  Diagnosis Date  . Arthritis   . Cancer ([place])    skin on nose  . Cervical disc disease   . Cystocele   . Heart murmur   . History of adenomatous polyp of colon 06/24/15  . History of rheumatic fever    childhood  . Hypothyroidism   . Liver tumor (benign)    3.9  cm fatty tumor, found 1997, 3 yrs. surveillance  . Mitral valve prolapse   . PONV (postoperative nausea and vomiting)   . Primary osteoarthritis of right hip    Intraarticular steroid injection by Dr. Nelva Bush 01/2016  . Rectocele   . Rectocele   . SA node dysfunction [facility])    Past Surgical History:  Procedure Laterality Date  . ABDOMINAL HYSTERECTOMY  05/1996   TVH--ovaries remain  . ABDOMINAL SACROCOLPOPEXY N/A 06/13/2017   Procedure: ABDOMINO SACROCOLPOPEXY with Halbans culdoplasty;  Surgeon: Nunzio Cobbs, MD;  Location: [facility];  Service: Gynecology;  Laterality: N/A;  . ANTERIOR AND POSTERIOR REPAIR N/A 06/13/2017   Procedure: ANTERIOR (CYSTOCELE) AND POSTERIOR REPAIR (RECTOCELE);  Surgeon: Nunzio Cobbs, MD;  Location: [facility];  Service: Gynecology;  Laterality: N/A;  . arthroscopic knee Right   . BACK SURGERY  2012   disc rupture  . BILATERAL SALPINGECTOMY Bilateral 06/13/2017   Procedure: BILATERAL SALPINGECTOMY;  Surgeon: Nunzio Cobbs, MD;  Location: [facility];  Service: Gynecology;  Laterality: Bilateral;  . BLADDER SUSPENSION N/A 06/13/2017   Procedure: TRANSVAGINAL TAPE (TVT) PROCEDURE;  Surgeon: Nunzio Cobbs, MD;  Location: [facility];  Service: Gynecology;  Laterality: N/A;  . COLONOSCOPY W/ POLYPECTOMY  06/24/15   Recall 5 yrs ([facility])  . CYSTOSCOPY N/A 06/13/2017   Procedure: CYSTOSCOPY;  Surgeon: Nunzio Cobbs, MD;  Location: [facility];  Service: Gynecology;  Laterality: N/A;  . KNEE RECONSTRUCTION Right 2008  . LIVER BIOPSY  1997   fatty tumors   Family History  Problem Relation Age of Onset  . Alcohol abuse Mother   . CVA Mother        Dec 65  . Heart disease Mother   . Cancer Father        prostate  . Arthritis Father   . Heart murmur Son   . Alcohol abuse Sister   . Heart disease Brother        congenital heart defect--Dec age 72 from MI  . Heart murmur Son   . Heart murmur Son     . Cancer Maternal Aunt 50       Dec colon CA age 26  . Cancer Cousin 50       Dec colon CA age 66   Allergies as of 06/20/2018   No Known Allergies     Medication List        Accurate as of 06/20/18 10:16 AM. Always use your most recent med list.          aspirin 81 MG tablet Take 81 mg at bedtime by mouth.   calcium gluconate 500 MG tablet Take 1 tablet by mouth 3 (three) times daily.   cholecalciferol 1000 units tablet Commonly known as:  VITAMIN D Take 1,000 Units by mouth daily.   conjugated estrogens vaginal cream Commonly known as:  PREMARIN Use 1/2 g vaginally every night at bed time for 2 weeks, then use 1/2 g vaginally two times per week.   escitalopram 10 MG tablet Commonly known as:  LEXAPRO Take 1 tablet (10 mg total) by mouth daily.   Fish Oil 1000 MG Caps Take 1 capsule by mouth daily.   levothyroxine 25 MCG tablet Commonly known as:  LEVOTHROID Take 1 tablet (25 mcg total) by mouth daily before breakfast.       All past medical history, surgical history, allergies, family history, immunizations andmedications were updated in the EMR today and reviewed under the history and medication portions of their EMR.     ROS: Negative, with the exception of above mentioned in HPI   Objective:  BP 108/71 (BP Location: Right Arm, Patient Position: Sitting, Cuff Size: Large)   Pulse (!) 58   Temp (!) 97.5 F (36.4 C)   Resp 20   Ht 5\' 8"  (1.727 m)   Wt 211 lb 8 oz (95.9 kg)   LMP 03/28/1996 (Within Months)   SpO2 97%   BMI 32.16 kg/m  Body mass index is 32.16 kg/m. Gen: Afebrile. No acute distress. Nontoxic in appearance, well developed, well nourished.  HENT: AT. [place]. Bilateral TM visualized with fluid present, no erythema or bulging bilateral R>L. MMM, no oral lesions. Bilateral nares without erythema, swelling or drainage. Throat without erythema or exudates. No cough or hoarseness.  Eyes:Pupils Equal Round Reactive to light, Extraocular movements  intact,  Conjunctiva without redness, discharge or icterus. Neck/lymp/endocrine: Supple,no lymphadenopathy CV: RRR  Chest: CTAB, no wheeze or crackles. Good air movement, normal resp effort.  MSK: NROM. No obvious deformities or discomfort.  Neuro:  Normal gait. PERLA. EOMi. Alert. Oriented x3  No exam data present No results  found. No results found for this or any previous visit (from the past 24 hour(s)).  Assessment/Plan: RABIAH GOESER is a 62 y.o. female present for OV for  Osteoarthritis of multiple joints, unspecified osteoarthritis type - reassured having some flares can be normal and not need orthopedic intervention. She is improving and almost back to her normal after 2-3 days of conservative self treatment.  - Educated her on alarm signs. Intervention with NSAIDS, ice/heat, steroids, muscle relaxers etc discussed. If having more flares or persistent flares, would certainly agree to move forward with evaluation. She will communicate if she feels this is necessary.  - offered daily mobic (w/ food), which she is agreeable to try. - F/u PRN  Environmental allergies/Ear discomfort, bilateral - ear and eye symptoms appear allergy related today.  - start flonase daily. Consider OTC allergy pill as well if desired.  - could consider eye drops, but it is not currently al that bad and is improving.  - fluticasone (FLONASE) 50 MCG/ACT nasal spray; Place 2 sprays into both nostrils daily.  Dispense: 16 g; Refill: 6 - f/u PRN   Reviewed expectations re: course of current medical issues.  Discussed self-management of symptoms.  Outlined signs and symptoms indicating need for more acute intervention.  Patient verbalized understanding and all questions were answered.  Patient received an After-Visit Summary.    No orders of the defined types were placed in this encounter.    Note is dictated utilizing voice recognition software. Although note has been proof read prior to  signing, occasional typographical errors still can be missed. If any questions arise, please do not hesitate to call for verification.   electronically signed by:  Howard Pouch, DO  [place]

## 2018-06-21 ENCOUNTER — Other Ambulatory Visit: Payer: Self-pay | Admitting: Obstetrics and Gynecology

## 2018-06-21 NOTE — Telephone Encounter (Signed)
Medication refill request: Premarin Vaginal cream  Last AEX:  03/28/18 office visit  Next AEX: none scheduled at this time  Last MMG (if hormonal medication request): 6/113/19 Bi-rads Category 1 neg  Refill authorized: Please refill if appropriate.

## 2018-07-18 ENCOUNTER — Ambulatory Visit: Payer: BC Managed Care – PPO | Admitting: Family Medicine

## 2018-07-18 ENCOUNTER — Encounter: Payer: Self-pay | Admitting: Family Medicine

## 2018-07-18 VITALS — BP 105/70 | HR 62 | Temp 98.0°F | Resp 16 | Ht 68.0 in | Wt 213.5 lb

## 2018-07-18 DIAGNOSIS — J029 Acute pharyngitis, unspecified: Secondary | ICD-10-CM | POA: Diagnosis not present

## 2018-07-18 DIAGNOSIS — J069 Acute upper respiratory infection, unspecified: Secondary | ICD-10-CM

## 2018-07-18 DIAGNOSIS — Z79899 Other long term (current) drug therapy: Secondary | ICD-10-CM

## 2018-07-18 LAB — POCT RAPID STREP A (OFFICE): Rapid Strep A Screen: NEGATIVE

## 2018-07-18 LAB — BASIC METABOLIC PANEL
BUN: 19 mg/dL (ref 6–23)
CALCIUM: 9.5 mg/dL (ref 8.4–10.5)
CO2: 25 mEq/L (ref 19–32)
Chloride: 104 mEq/L (ref 96–112)
Creatinine, Ser: 0.78 mg/dL (ref 0.40–1.20)
GFR: 79.6 mL/min (ref >60.00)
GLUCOSE: 109 mg/dL — AB (ref 70–99)
POTASSIUM: 4.7 meq/L (ref 3.5–5.1)
SODIUM: 138 meq/L (ref 135–145)

## 2018-07-18 MED ORDER — DOXYCYCLINE HYCLATE 100 MG PO TABS: 100.0000 mg | ORAL_TABLET | Freq: Two times a day (BID) | ORAL | 0 refills | Status: DC

## 2018-07-18 NOTE — Patient Instructions (Addendum)
It was nice to see you today.  Printed script for doxycyline to start in 3-4 days if symptoms are not resolved or worsening. However, if you get a fever, chills prior to 3-4 days--> start antibiotic.  If you continue to improve--> shred script.   Rest, hydrate.  + flonase, mucinex (DM if cough), robitussin cough syrup. Use  nettie pot or nasal saline daily.    We will check your kidney function today and call you with results

## 2018-07-18 NOTE — Progress Notes (Signed)
Cynthia [name] , 01-12-1956, 62 y.o., female MRN: [id number] Patient Care Team    Relationship Specialty Notifications Start End  Ma Hillock, DO PCP - General Family Medicine  07/14/15   Rosana Berger, MD Consulting Physician Gastroenterology  06/29/15   Melina Schools, MD Consulting Physician Orthopedic Surgery  04/13/16     Chief Complaint  Patient presents with  . URI    drainage, cough, sore throat, ears ache, felt feverish yesterday, took some NyQuil last night     Subjective: Pt presents for an OV with complaints of cough of 3 days duration.  Associated symptoms include sore throat, drainage, ears hurt and felt feverish. No sick contacts.  Work pulled up carpet last week that was [employer], she is wondering if it could be from the mold.   Taking flonase.  Pt has tried Nyquil to ease their symptoms.   Depression screen [facility] 2/9 06/20/2018 08/16/2017 05/25/2016  Decreased Interest 0 0 0  Down, Depressed, Hopeless 0 0 0  PHQ - 2 Score 0 0 0    No Known Allergies Social History   Tobacco Use  . Smoking status: Never Smoker  . Smokeless tobacco: Never Used  Substance Use Topics  . Alcohol use: Yes    Alcohol/week: 4.0 standard drinks    Types: 4 Glasses of wine per week   Past Medical History:  Diagnosis Date  . Arthritis   . Cancer ([place])    skin on nose  . Cervical disc disease   . Cystocele   . Heart murmur   . History of adenomatous polyp of colon 06/24/15  . History of rheumatic fever    childhood  . Hypothyroidism   . 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 surveillance  . Mitral valve prolapse   . PONV (postoperative nausea and vomiting)   . Primary osteoarthritis of right hip    Intraarticular steroid injection by Dr. Nelva Bush 01/2016  . Rectocele   . SA node dysfunction [facility])    Past Surgical History:  Procedure Laterality Date  . ABDOMINAL HYSTERECTOMY  05/1996   TVH--ovaries remain  . ABDOMINAL SACROCOLPOPEXY N/A  06/13/2017   Procedure: ABDOMINO SACROCOLPOPEXY with Halbans culdoplasty;  Surgeon: Nunzio Cobbs, MD;  Location: [place] [facility];  Service: Gynecology;  Laterality: N/A;  . ANTERIOR AND POSTERIOR REPAIR N/A 06/13/2017   Procedure: ANTERIOR (CYSTOCELE) AND POSTERIOR REPAIR (RECTOCELE);  Surgeon: Nunzio Cobbs, MD;  Location: [place] [facility];  Service: Gynecology;  Laterality: N/A;  . arthroscopic knee Right   . BACK SURGERY  2012   disc rupture  . BILATERAL SALPINGECTOMY Bilateral 06/13/2017   Procedure: BILATERAL SALPINGECTOMY;  Surgeon: Nunzio Cobbs, MD;  Location: [place] [facility];  Service: Gynecology;  Laterality: Bilateral;  . BLADDER SUSPENSION N/A 06/13/2017   Procedure: TRANSVAGINAL TAPE (TVT) PROCEDURE;  Surgeon: Nunzio Cobbs, MD;  Location: [place] [facility];  Service: Gynecology;  Laterality: N/A;  . COLONOSCOPY W/ POLYPECTOMY  06/24/15   Recall 5 yrs (Dr. [facility])  . CYSTOSCOPY N/A 06/13/2017   Procedure: CYSTOSCOPY;  Surgeon: Nunzio Cobbs, MD;  Location: [place] [facility];  Service: Gynecology;  Laterality: N/A;  . KNEE RECONSTRUCTION Right 2008  . LIVER BIOPSY  1997   fatty tumors   Family History  Problem Relation Age of Onset  . Alcohol abuse Mother   . CVA Mother  Dec 65  . Heart disease Mother   . Cancer Father        prostate  . Arthritis Father   . Heart murmur Son   . Alcohol abuse Sister   . Heart disease Brother        congenital heart defect--Dec age 62 from MI  . Heart murmur Son   . Heart murmur Son   . Cancer Maternal Aunt 50       Dec colon CA age 36  . Cancer Cousin 50       Dec colon CA age 5   Allergies as of 07/18/2018   No Known Allergies     Medication List        Accurate as of 07/18/18  5:18 PM. Always use your most recent med list.          aspirin 81 MG tablet Take 81 mg at bedtime by mouth.   CALCIUM 600 PO Take 1 tablet by mouth daily.   calcium gluconate 500 MG tablet Take 1  tablet by mouth 3 (three) times daily.   cholecalciferol 1000 units tablet Commonly known as:  VITAMIN D Take 1,000 Units by mouth daily.   cholecalciferol 1000 units tablet Commonly known as:  VITAMIN D Take 1,000 Units by mouth daily.   conjugated estrogens vaginal cream Commonly known as:  PREMARIN USE 1/2 G VAGINALLY 2 TIMES PER WEEK   doxycycline 100 MG tablet Commonly known as:  VIBRA-TABS Take 1 tablet (100 mg total) by mouth 2 (two) times daily.   escitalopram 10 MG tablet Commonly known as:  LEXAPRO Take 1 tablet (10 mg total) by mouth daily.   Fish Oil 1000 MG Caps Take 1 capsule by mouth daily.   fluticasone 50 MCG/ACT nasal spray Commonly known as:  FLONASE Place 2 sprays into both nostrils daily.   levothyroxine 25 MCG tablet Commonly known as:  SYNTHROID, LEVOTHROID Take 1 tablet (25 mcg total) by mouth daily before breakfast.   meloxicam 15 MG tablet Commonly known as:  MOBIC Take 1 tablet (15 mg total) by mouth daily.       All past medical history, surgical history, allergies, family history, immunizations andmedications were updated in the EMR today and reviewed under the history and medication portions of their EMR.     ROS: Negative, with the exception of above mentioned in HPI   Objective:  BP 105/70 (BP Location: Right Arm, Patient Position: Sitting, Cuff Size: Normal)   Pulse 62   Temp 98 F (36.7 C) (Oral)   Resp 16   Ht 5\' 8"  (1.727 m)   Wt 213 lb 8 oz (96.8 kg)   LMP 03/28/1996 (Within Months)   SpO2 94%   BMI 32.46 kg/m  Body mass index is 32.46 kg/m. Gen: Afebrile. No acute distress. Nontoxic in appearance, well developed, well nourished.  HENT: AT. [place]. Bilateral TM visualized with mild fullness left. MMM, no oral lesions. Bilateral nares with mild erythema left and drainage. Throat without erythema or exudates. Mild cough. Mild hoarseness.  Eyes:Pupils Equal Round Reactive to light, Extraocular movements intact,  Conjunctiva  without redness, discharge or icterus. Neck/lymp/endocrine: Supple, left anterior cervical lymphadenopathy CV: RRR  Chest: CTAB, no wheeze or crackles. Good air movement, normal resp effort.  Skin: No rashes, purpura or petechiae.  Neuro:  Normal gait. PERLA. EOMi. Alert. Oriented x3  No exam data present No results found. Results for orders placed or performed in visit on 07/18/18 (from the past 24  hour(s))  Basic Metabolic Panel (BMET)     Status: Abnormal   Collection Time: 07/18/18  9:30 AM  Result Value Ref Range   Sodium 138 135 - 145 mEq/L   Potassium 4.7 3.5 - 5.1 mEq/L   Chloride 104 96 - 112 mEq/L   CO2 25 19 - 32 mEq/L   Glucose, Bld 109 (H) 70 - 99 mg/dL   BUN 19 6 - 23 mg/dL   Creatinine, Ser 0.78 0.40 - 1.20 mg/dL   Calcium 9.5 8.4 - 10.5 mg/dL   GFR 79.60 >60.00 mL/min  POCT rapid strep A     Status: Normal   Collection Time: 07/18/18  9:35 AM  Result Value Ref Range   Rapid Strep A Screen Negative Negative    Assessment/Plan: DARLYNN RICCO is a 62 y.o. female present for OV for  Encounter for long-term (current) use of medications/arthritis She is concerned daily meloxicam could be causing issues with her kidneys and she is having some soreness feeling in her low back.  She states it is working great for her arthritis. - Basic Metabolic Panel (BMET)  Sore throat/Upper respiratory tract infection, unspecified type Strep negative Rest, hydrate.  + flonase, mucinex (DM if cough), robitussin cough syrup. Use  nettie pot or nasal saline daily.  Doxy printed to start if worsening in 3-4 days If cough present it can last up to 6-8 weeks.  F/U 2 weeks of not improved.      Reviewed expectations re: course of current medical issues.  Discussed self-management of symptoms.  Outlined signs and symptoms indicating need for more acute intervention.  Patient verbalized understanding and all questions were answered.  Patient received an After-Visit  Summary.    Orders Placed This Encounter  Procedures  . Basic Metabolic Panel (BMET)  . POCT rapid strep A     Note is dictated utilizing voice recognition software. Although note has been proof read prior to signing, occasional typographical errors still can be missed. If any questions arise, please do not hesitate to call for verification.   electronically signed by:  Howard Pouch, DO  [place]

## 2018-07-20 ENCOUNTER — Telehealth: Payer: Self-pay | Admitting: Family Medicine

## 2018-07-20 NOTE — Telephone Encounter (Signed)
Pt given results per notes of Dr. Raoul Pitch on 07/18/18, patient verbalized understanding. Unable to document in result note due to result note not being routed to [facility].

## 2018-07-22 IMAGING — CT CT ABD-PELV W/ CM
2 of 7 series · 16 of 46 positions shown, 18 images · IV contrast (Omni 300)
Comparison: None.

CLINICAL DATA: Right buttock mass starting 6 days ago.

EXAM:
CT ABDOMEN AND PELVIS WITH CONTRAST
TECHNIQUE: Multidetector CT imaging of the abdomen and pelvis was performed
using the standard protocol following bolus administration of
intravenous contrast.
CONTRAST:  100mL P18BU0-EVV IOPAMIDOL (P18BU0-EVV) INJECTION 61%

[Series 3: a/p w/ 5mm · axial · 0.96mm/px · z∈[+863,+1273]mm · 13 of 94 slices shown, 15 images]
[im 6/94  soft-tissue]
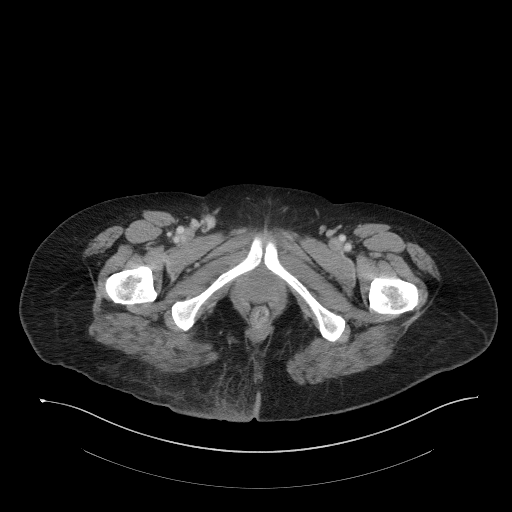
[im 6/94  bone]
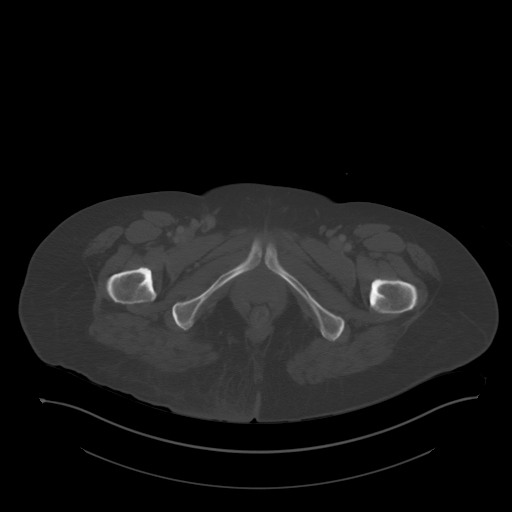
[im 11/94  soft-tissue]
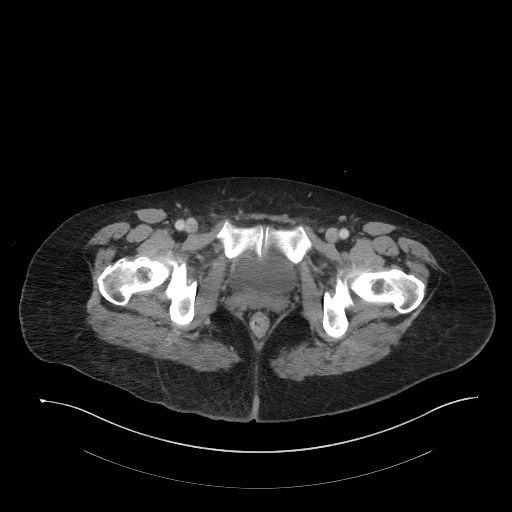
[im 22/94  soft-tissue]
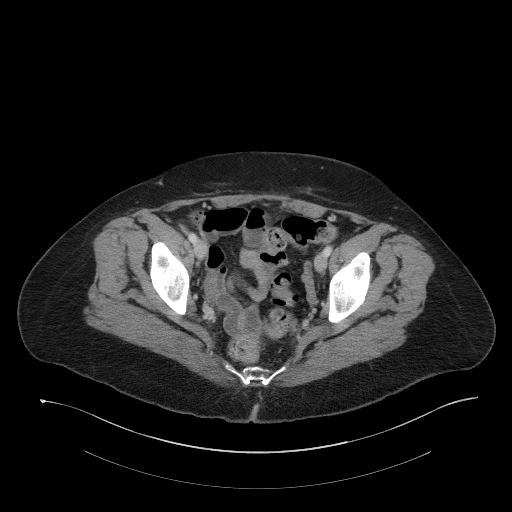
[im 28/94  soft-tissue]
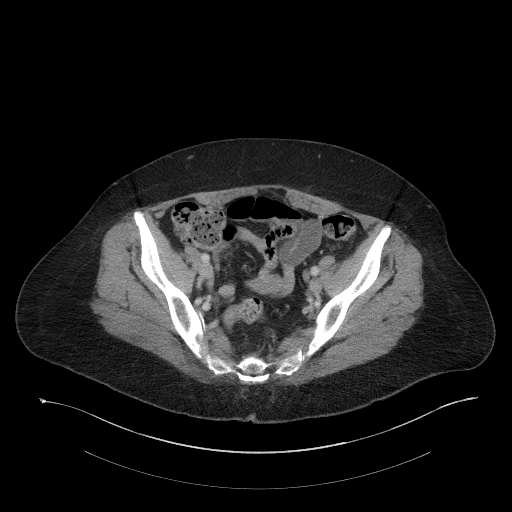
[im 33/94  soft-tissue]
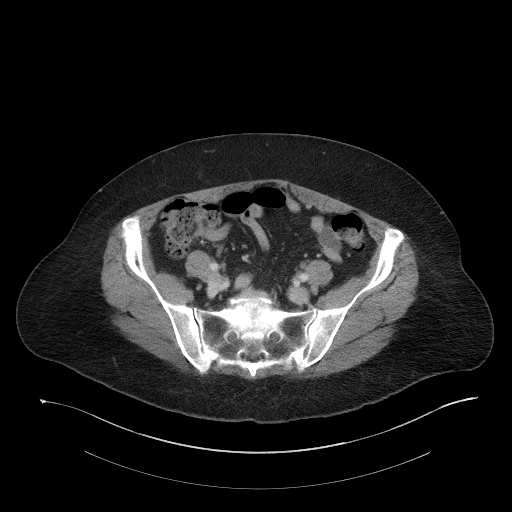
[im 39/94  soft-tissue]
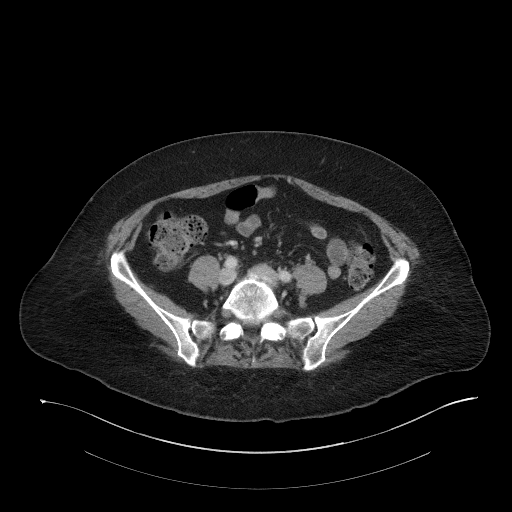
[im 50/94  soft-tissue]
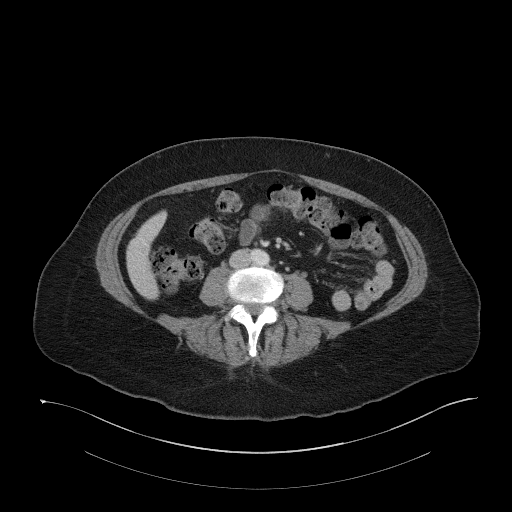
[im 55/94  soft-tissue]
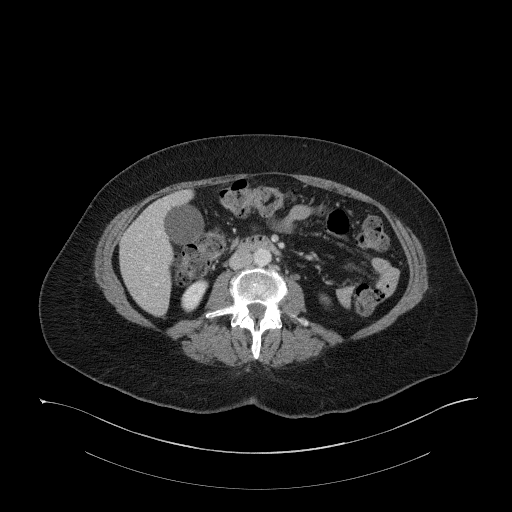
[im 61/94  soft-tissue]
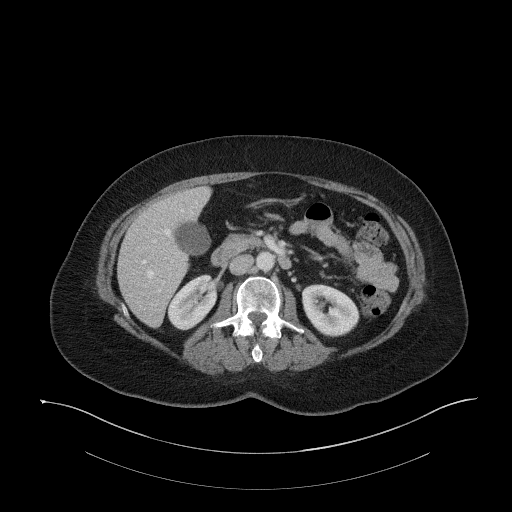
[im 61/94  bone]
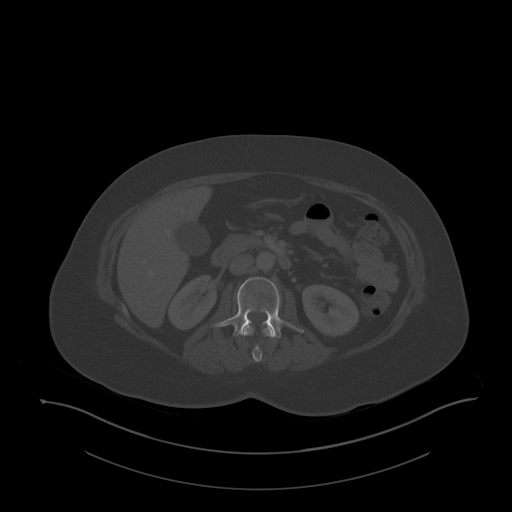
[im 66/94  soft-tissue]
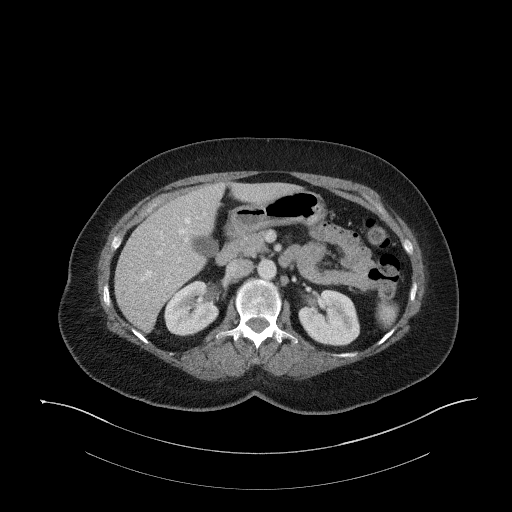
[im 72/94  soft-tissue]
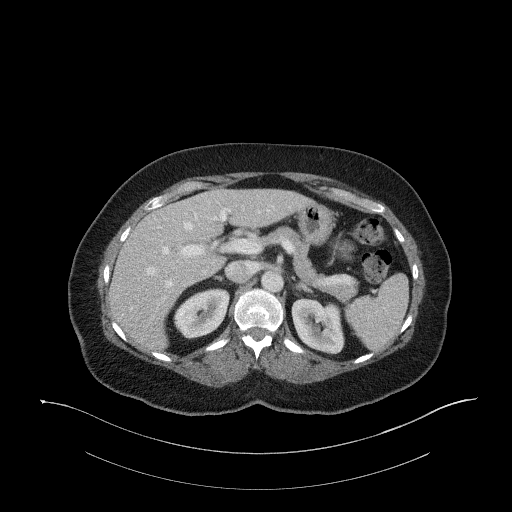
[im 83/94  soft-tissue]
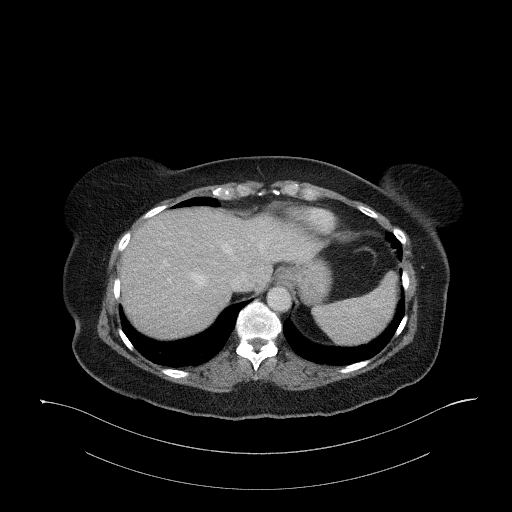
[im 88/94  soft-tissue]
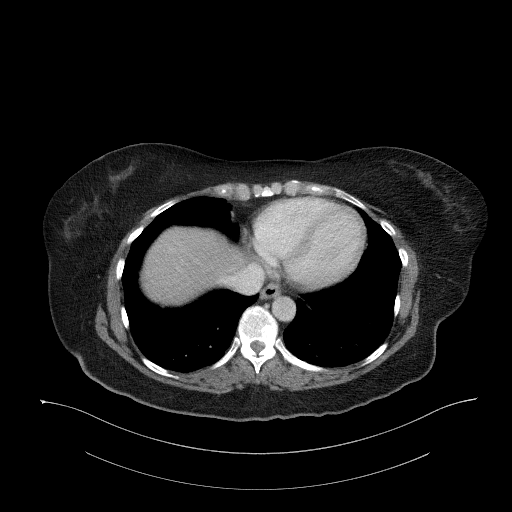

[Series 9: a/p w/ cor · coronal · 0.86mm/px · 3 of 147 slices shown]
[im 49/147  soft-tissue]
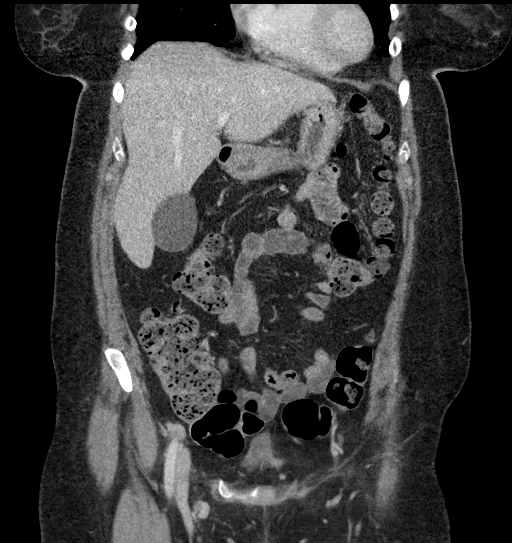
[im 65/147  soft-tissue]
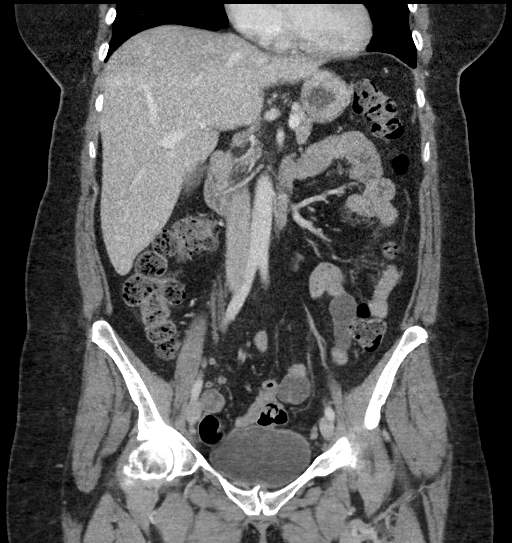
[im 82/147  soft-tissue]
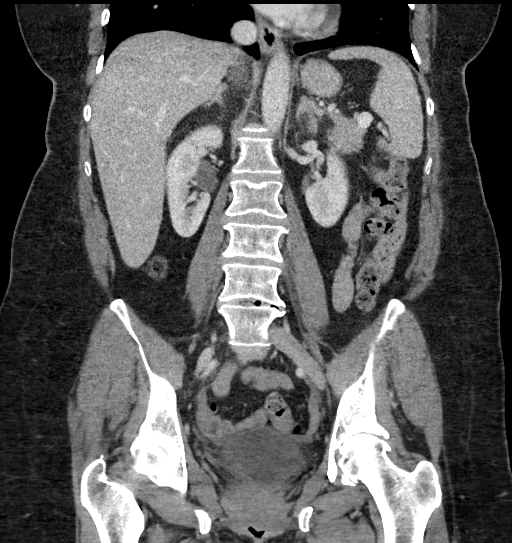

[16 of 46 positions shown; findings below may reference images not displayed]

FINDINGS: Lower chest: Bibasilar dependent atelectasis. The included heart
size is normal without pericardial effusion. Small hiatal hernia.

Hepatobiliary: No space-occupying mass of the liver. No gallstones
or secondary signs of acute acute cholecystitis. No biliary
dilatation.

Pancreas: Normal

Spleen: 1.5 cm circumscribed hypodensity consistent with a acquired
or congenital cyst. No splenomegaly.

Adrenals/Urinary Tract: Tiny too small to further characterize ovoid
nodule at the left adrenal apex measuring 7 x 9 mm. The kidneys,
ureters and bladder are nonacute.

Stomach/Bowel: Nondistended stomach. Normal small bowel rotation
without inflammation or obstruction. A moderate to large amount of
fecal retention is seen within the colon without inflammation.
Normal appendix.

Vascular/Lymphatic: No significant vascular findings are present. No
enlarged abdominal or pelvic lymph nodes.

Reproductive: Status post hysterectomy. No adnexal masses.

Other: Small fat containing umbilical hernia.

Musculoskeletal: There is subcutaneous soft tissue induration of the
the right buttock medially along the Dede cleft. No drainable fluid
collections are noted. Degenerative disc disease with vacuum disc
phenomenon at L4-5. Multiple Schmorl's nodes along the included
lower thoracic and upper lumbar spine. No kyphosis.
IMPRESSION: 1. Moderate degree of subcutaneous soft tissue induration consistent
with cellulitis of the right buttock. No drainable fluid collections
are noted.
2. 1.5 cm splenic cyst.
3. Increased colonic stool burden.

## 2018-08-03 ENCOUNTER — Ambulatory Visit: Payer: BC Managed Care – PPO | Admitting: Family Medicine

## 2018-08-03 ENCOUNTER — Encounter: Payer: Self-pay | Admitting: Family Medicine

## 2018-08-03 VITALS — BP 127/63 | HR 54 | Temp 97.7°F | Resp 20 | Ht 68.0 in | Wt 215.5 lb

## 2018-08-03 DIAGNOSIS — R053 Chronic cough: Secondary | ICD-10-CM

## 2018-08-03 DIAGNOSIS — R05 Cough: Secondary | ICD-10-CM

## 2018-08-03 MED ORDER — HYDROCODONE-HOMATROPINE 5-1.5 MG/5ML PO SYRP: 5.0000 mL | ORAL_SOLUTION | Freq: Three times a day (TID) | ORAL | 0 refills | Status: DC | PRN

## 2018-08-03 MED ORDER — PREDNISONE 50 MG PO TABS: ORAL_TABLET | ORAL | 0 refills | Status: DC

## 2018-08-03 NOTE — Patient Instructions (Signed)
Cough syrup prescribed.  Start prednisone as soon as possible.  Please have CXR completed this weekend at [facility] point.  COntinue mucinex and flonase.  Start over the counter antihistamine.   This may just be left over cough from your illness, which can take 6-8 weeks after treatment to resolve, but not contagious. However we want to make sure that is all considering the mold at work.    Hope you feel better

## 2018-08-03 NOTE — Progress Notes (Signed)
Cynthia [name] , 29-Dec-1955, 62 y.o., female MRN: [id number] Patient Care Team    Relationship Specialty Notifications Start End  Ma Hillock, DO PCP - General Family Medicine  07/14/15   Rosana Berger, MD Consulting Physician Gastroenterology  06/29/15   Melina Schools, MD Consulting Physician Orthopedic Surgery  04/13/16     Chief Complaint  Patient presents with  . URI    cough,SHOB     Subjective:  Pt returns to day secondary to persistent cough.  She was seen 2 weeks ago and treated with doxycycline.  She continued to take the Flonase and Mucinex.  She reports today her cough has continued.  Her employer would like her to seek more treatment.  Her cough is not productive usually.  She states sometimes she will feel short of breath, like she cannot get enough oxygen, and then she will cough up phlegm and feel better.  But sometimes is able to cough up phlegm.  She denies fever, chills, nausea, vomiting.  She denies wheezing. Prior note 07/18/2018: Pt presents for an OV with complaints of cough of 3 days duration. /:  Associated symptoms include sore throat, drainage, ears hurt and felt feverish. No sick contacts.  Work pulled up carpet last week that was [employer], she is wondering if it could be from the mold.   Taking flonase.  Pt has tried Nyquil to ease their symptoms.   Depression screen [facility] 2/9 06/20/2018 08/16/2017 05/25/2016  Decreased Interest 0 0 0  Down, Depressed, Hopeless 0 0 0  PHQ - 2 Score 0 0 0   No Known Allergies Social History   Tobacco Use  . Smoking status: Never Smoker  . Smokeless tobacco: Never Used  Substance Use Topics  . Alcohol use: Yes    Alcohol/week: 4.0 standard drinks    Types: 4 Glasses of wine per week   Past Medical History:  Diagnosis Date  . Arthritis   . Cancer ([place])    skin on nose  . Cervical disc disease   . Cystocele   . Heart murmur   . History of adenomatous polyp of colon 06/24/15  . History of rheumatic fever    childhood  . Hypothyroidism   . 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 surveillance  . Mitral valve prolapse   . PONV (postoperative nausea and vomiting)   . Primary osteoarthritis of right hip    Intraarticular steroid injection by Dr. Nelva Bush 01/2016  . Rectocele   . SA node dysfunction [facility])    Past Surgical History:  Procedure Laterality Date  . ABDOMINAL HYSTERECTOMY  05/1996   TVH--ovaries remain  . ABDOMINAL SACROCOLPOPEXY N/A 06/13/2017   Procedure: ABDOMINO SACROCOLPOPEXY with Halbans culdoplasty;  Surgeon: Nunzio Cobbs, MD;  Location: [place] [facility];  Service: Gynecology;  Laterality: N/A;  . ANTERIOR AND POSTERIOR REPAIR N/A 06/13/2017   Procedure: ANTERIOR (CYSTOCELE) AND POSTERIOR REPAIR (RECTOCELE);  Surgeon: Nunzio Cobbs, MD;  Location: [place] [facility];  Service: Gynecology;  Laterality: N/A;  . arthroscopic knee Right   . BACK SURGERY  2012   disc rupture  . BILATERAL SALPINGECTOMY Bilateral 06/13/2017   Procedure: BILATERAL SALPINGECTOMY;  Surgeon: Nunzio Cobbs, MD;  Location: [place] [facility];  Service: Gynecology;  Laterality: Bilateral;  . BLADDER SUSPENSION N/A 06/13/2017   Procedure: TRANSVAGINAL TAPE (TVT) PROCEDURE;  Surgeon: Nunzio Cobbs, MD;  Location: [place] [facility];  Service:  Gynecology;  Laterality: N/A;  . COLONOSCOPY W/ POLYPECTOMY  06/24/15   Recall 5 yrs (Dr. [facility])  . CYSTOSCOPY N/A 06/13/2017   Procedure: CYSTOSCOPY;  Surgeon: Nunzio Cobbs, MD;  Location: [place] [facility];  Service: Gynecology;  Laterality: N/A;  . KNEE RECONSTRUCTION Right 2008  . LIVER BIOPSY  1997   fatty tumors   Family History  Problem Relation Age of Onset  . Alcohol abuse Mother   . CVA Mother        Dec 65  . Heart disease Mother   . Cancer Father        prostate  . Arthritis Father   . Heart murmur Son   . Alcohol abuse Sister   . Heart disease Brother        congenital heart defect--Dec age 79 from  MI  . Heart murmur Son   . Heart murmur Son   . Cancer Maternal Aunt 50       Dec colon CA age 50  . Cancer Cousin 50       Dec colon CA age 17   Allergies as of 08/03/2018   No Known Allergies     Medication List        Accurate as of 08/03/18  3:51 PM. Always use your most recent med list.          aspirin 81 MG tablet Take 81 mg at bedtime by mouth.   Calcium 600-400 MG-UNIT Chew Chew 1 tablet by mouth daily.   calcium gluconate 500 MG tablet Take 1 tablet by mouth 3 (three) times daily.   conjugated estrogens vaginal cream Commonly known as:  PREMARIN USE 1/2 G VAGINALLY 2 TIMES PER WEEK   escitalopram 10 MG tablet Commonly known as:  LEXAPRO Take 1 tablet (10 mg total) by mouth daily.   Fish Oil 1000 MG Caps Take 1 capsule by mouth daily.   fluticasone 50 MCG/ACT nasal spray Commonly known as:  FLONASE Place 2 sprays into both nostrils daily.   levothyroxine 25 MCG tablet Commonly known as:  SYNTHROID, LEVOTHROID Take 1 tablet (25 mcg total) by mouth daily before breakfast.   meloxicam 15 MG tablet Commonly known as:  MOBIC Take 1 tablet (15 mg total) by mouth daily.       All past medical history, surgical history, allergies, family history, immunizations andmedications were updated in the EMR today and reviewed under the history and medication portions of their EMR.     ROS: Negative, with the exception of above mentioned in HPI   Objective:  BP 127/63 (BP Location: Right Arm, Patient Position: Sitting, Cuff Size: Large)   Pulse (!) 54   Temp 97.7 F (36.5 C)   Resp 20   Ht 5\' 8"  (1.727 m)   Wt 215 lb 8 oz (97.8 kg)   LMP 03/28/1996 (Within Months)   SpO2 97%   BMI 32.77 kg/m  Body mass index is 32.77 kg/m. Gen: Afebrile. No acute distress.  Nontoxic.  Well-developed, well-nourished, Caucasian female. HENT: AT. [place]. Bilateral TM visualized and normal in appearance. MMM. Bilateral nares without erythema, swelling or drainage. Throat without  erythema or exudates.  Barking cough present.  Hoarseness present. Eyes:Pupils Equal Round Reactive to light, Extraocular movements intact,  Conjunctiva without redness, discharge or icterus. Neck/lymp/endocrine: Supple, no lymphadenopathy, no thyromegaly CV: RRR no murmur, no edema, +2/4 P posterior tibialis pulses Chest: CTAB, no wheeze or crackles Abd: Soft. NTND. BS present.  No masses  palpated.  Skin: No rashes, purpura or petechiae.    No exam data present No results found. No results found for this or any previous visit (from the past 24 hour(s)).  Assessment/Plan: EMME ROSENAU is a 62 y.o. female present for OV for  Cough: Patient was seen last week, strep negative.  Treated with doxycycline.  Using Flonase.  Cough remains and is barking sometimes productive with complaints of shortness of breath.   Rest, hydrate.  Continue Flonase.  Start daily antihistamine.  Continue Mucinex. Prednisone course prescribed. Hycodan prescribed for cough Chest x-ray ordered for shortness of breath complaints and history of recent exposure to mold. If cough present it can last up to 6-8 weeks.  F/U 2 weeks of not improved.      Reviewed expectations re: course of current medical issues.  Discussed self-management of symptoms.  Outlined signs and symptoms indicating need for more acute intervention.  Patient verbalized understanding and all questions were answered.  Patient received an After-Visit Summary.    No orders of the defined types were placed in this encounter.    Note is dictated utilizing voice recognition software. Although note has been proof read prior to signing, occasional typographical errors still can be missed. If any questions arise, please do not hesitate to call for verification.   electronically signed by:  Howard Pouch, DO  [place]

## 2018-08-04 ENCOUNTER — Ambulatory Visit (HOSPITAL_BASED_OUTPATIENT_CLINIC_OR_DEPARTMENT_OTHER)
Admission: RE | Admit: 2018-08-04 | Discharge: 2018-08-04 | Disposition: A | Discharge status: Home / Self Care | Payer: BC Managed Care – PPO | Source: Ambulatory Visit | Attending: Family Medicine | Admitting: Family Medicine

## 2018-08-04 DIAGNOSIS — R053 Chronic cough: Secondary | ICD-10-CM

## 2018-08-04 DIAGNOSIS — R05 Cough: Secondary | ICD-10-CM | POA: Diagnosis present

## 2018-10-02 ENCOUNTER — Other Ambulatory Visit: Payer: Self-pay

## 2018-10-02 ENCOUNTER — Telehealth: Payer: Self-pay | Admitting: Obstetrics and Gynecology

## 2018-10-02 DIAGNOSIS — R232 Flushing: Secondary | ICD-10-CM

## 2018-10-02 MED ORDER — ESCITALOPRAM OXALATE 10 MG PO TABS: 10.0000 mg | ORAL_TABLET | Freq: Every day | ORAL | 0 refills | Status: DC

## 2018-10-02 NOTE — Telephone Encounter (Signed)
Patient left voicemail over lunch stating that she was experiencing back issues and believes it could be a result from surgery last year.

## 2018-10-02 NOTE — Telephone Encounter (Signed)
Left message to call Mardell Suttles, RN at [facility] [phone].   

## 2018-10-02 NOTE — Telephone Encounter (Signed)
Patient is returning a call to Jill. °

## 2018-10-02 NOTE — Telephone Encounter (Signed)
Left message to call Daya Dutt, RN at [facility] [phone].   

## 2018-10-03 NOTE — Telephone Encounter (Signed)
Patient requesting OV to discuss reoccurrence of rectocele. States she has had some recent "back flare ups", discussed with PT on Monday, would like to discuss more with Dr. Quincy Simmonds. Patient denies any other GYN symptoms or concerns. OV scheduled for 10/08/18 at 9:30am with Dr. Quincy Simmonds.   Routing to provider for final review. Patient is agreeable to disposition. Will close encounter.

## 2018-10-05 ENCOUNTER — Encounter: Payer: Self-pay | Admitting: *Deleted

## 2018-10-05 ENCOUNTER — Other Ambulatory Visit: Payer: Self-pay | Admitting: *Deleted

## 2018-10-05 MED ORDER — LEVOTHYROXINE SODIUM 25 MCG PO TABS: 25.0000 ug | ORAL_TABLET | Freq: Every day | ORAL | 0 refills | Status: DC

## 2018-10-08 ENCOUNTER — Encounter: Payer: Self-pay | Admitting: Obstetrics and Gynecology

## 2018-10-08 ENCOUNTER — Other Ambulatory Visit: Payer: Self-pay

## 2018-10-08 ENCOUNTER — Ambulatory Visit (INDEPENDENT_AMBULATORY_CARE_PROVIDER_SITE_OTHER): Payer: BC Managed Care – PPO | Admitting: Obstetrics and Gynecology

## 2018-10-08 VITALS — BP 115/62 | HR 74 | Resp 14 | Ht 68.0 in | Wt 212.0 lb

## 2018-10-08 DIAGNOSIS — N816 Rectocele: Secondary | ICD-10-CM

## 2018-10-08 DIAGNOSIS — T83711D Erosion of implanted vaginal mesh and other prosthetic materials to surrounding organ or tissue, subsequent encounter: Secondary | ICD-10-CM

## 2018-10-08 NOTE — Progress Notes (Signed)
GYNECOLOGY  VISIT   HPI: 62 y.o.   Married  Caucasian  female   G3P3 with Patient's last menstrual period was 03/28/1996 (within months).   here for rectocele problems.  Patient is to have a knee replacement in the next month.  Her provider is concerned that her back pain is due to her knee pain but there is concern that her colpopexy mesh is causing instability in her back.   States a low grade fever for a long time but not now.  Has been treated for allergies by her PCP.   Second degree rectocele noted on 03/28/18 office visit.   Does have a small mesh exposure. No vaginal bleeding.   Did PT and electrical stimulation. Rectal functioning is improved but doing some vaginal splinting to have BMs. Using probiotics.   Good control of bladder but having urgency.  No leakage.  Having a back flare up which started one month ago.  Hx prior back surgery.  Taking Meloxicam.   GYNECOLOGIC HISTORY: Patient's last menstrual period was 03/28/1996 (within months). Contraception:  Hysterectomy Menopausal hormone therapy:  Premarin cream Last mammogram: 05-10-18 Neg/density B/BiRads1 Last pap smear: Years ago, normal per pt.        OB History    Gravida  3   Para  3   Term      Preterm      AB      Living  3     SAB      TAB      Ectopic      Multiple      Live Births  3              Patient Active Problem List   Diagnosis Date Noted  . MVP (mitral valve prolapse) 03/14/2017  . Family history of aortic aneurysm 03/14/2017  . Osteoarthritis 05/25/2016  . Elevated hemoglobin A1c 05/25/2016  . Obesity (BMI 30-39.9) 05/25/2016  . Vitamin D deficiency 05/12/2015  . History of colon polyps 05/27/2014  . Vasomotor flushing 03/17/2014  . Personal history of cardiac arrhythmia 03/17/2014    Past Medical History:  Diagnosis Date  . Arthritis   . Cancer ([place])    skin on nose  . Cervical disc disease   . Cystocele   . Heart murmur   . History of adenomatous polyp  of colon 06/24/15  . History of rheumatic fever    childhood  . Hypothyroidism   . 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 surveillance  . Mitral valve prolapse   . PONV (postoperative nausea and vomiting)   . Primary osteoarthritis of right hip    Intraarticular steroid injection by Dr. Nelva Bush 01/2016  . Rectocele   . SA node dysfunction [facility])     Past Surgical History:  Procedure Laterality Date  . ABDOMINAL HYSTERECTOMY  05/1996   TVH--ovaries remain  . ABDOMINAL SACROCOLPOPEXY N/A 06/13/2017   Procedure: ABDOMINO SACROCOLPOPEXY with Halbans culdoplasty;  Surgeon: Nunzio Cobbs, MD;  Location: [facility];  Service: Gynecology;  Laterality: N/A;  . ANTERIOR AND POSTERIOR REPAIR N/A 06/13/2017   Procedure: ANTERIOR (CYSTOCELE) AND POSTERIOR REPAIR (RECTOCELE);  Surgeon: Nunzio Cobbs, MD;  Location: [facility];  Service: Gynecology;  Laterality: N/A;  . arthroscopic knee Right   . BACK SURGERY  2012   disc rupture  . BILATERAL SALPINGECTOMY Bilateral 06/13/2017   Procedure: BILATERAL SALPINGECTOMY;  Surgeon: Yisroel Ramming, Everardo All,  MD;  Location: [facility];  Service: Gynecology;  Laterality: Bilateral;  . BLADDER SUSPENSION N/A 06/13/2017   Procedure: TRANSVAGINAL TAPE (TVT) PROCEDURE;  Surgeon: Nunzio Cobbs, MD;  Location: [facility];  Service: Gynecology;  Laterality: N/A;  . COLONOSCOPY W/ POLYPECTOMY  06/24/15   Recall 5 yrs (Dr. [facility])  . CYSTOSCOPY N/A 06/13/2017   Procedure: CYSTOSCOPY;  Surgeon: Nunzio Cobbs, MD;  Location: [facility];  Service: Gynecology;  Laterality: N/A;  . KNEE RECONSTRUCTION Right 2008  . LIVER BIOPSY  1997   fatty tumors    Current Outpatient Medications  Medication Sig Dispense Refill  . levothyroxine (LEVOTHROID) 25 MCG tablet Take 1 tablet (25 mcg total) by mouth daily before breakfast. Need office visit prior to any additional refills 30 tablet 0  . meloxicam  (MOBIC) 15 MG tablet Take 1 tablet (15 mg total) by mouth daily. 90 tablet 3  . calcium gluconate 500 MG tablet Take 1 tablet by mouth 3 (three) times daily.    Marland Kitchen conjugated estrogens (PREMARIN) vaginal cream USE 1/2 G VAGINALLY 2 TIMES PER WEEK 30 g 1  . escitalopram (LEXAPRO) 10 MG tablet Take 1 tablet (10 mg total) by mouth daily. Needs appointment for further refills 30 tablet 0  . fluticasone (FLONASE) 50 MCG/ACT nasal spray Place 2 sprays into both nostrils daily. 16 g 6   No current facility-administered medications for this visit.      ALLERGIES: Patient has no known allergies.  Family History  Problem Relation Age of Onset  . Alcohol abuse Mother   . CVA Mother        Dec 65  . Heart disease Mother   . Cancer Father        prostate  . Arthritis Father   . Heart murmur Son   . Alcohol abuse Sister   . Heart disease Brother        congenital heart defect--Dec age 26 from MI  . Heart murmur Son   . Heart murmur Son   . Cancer Maternal Aunt 50       Dec colon CA age 57  . Cancer Cousin 1       Dec colon CA age 49    Social History   Socioeconomic History  . Marital status: Married    Spouse name: [name]  . Number of children: 3  . Years of education: Not on file  . Highest education level: Not on file  Occupational History  . Occupation: Product manager: [employer]  Social Needs  . Financial resource strain: Not on file  . Food insecurity:    Worry: Not on file    Inability: Not on file  . Transportation needs:    Medical: Not on file    Non-medical: Not on file  Tobacco Use  . Smoking status: Never Smoker  . Smokeless tobacco: Never Used  Substance and Sexual Activity  . Alcohol use: Yes    Alcohol/week: 4.0 standard drinks    Types: 4 Glasses of wine per week  . Drug use: No  . Sexual activity: Not Currently    Birth control/protection: Other-see comments, Surgical    Comment: Hyst  Lifestyle  . Physical activity:    Days per  week: Not on file    Minutes per session: Not on file  . Stress: Not on file  Relationships  . Social connections:    Talks on phone: Not on  file    Gets together: Not on file    Attends religious service: Not on file    Active member of club or organization: Not on file    Attends meetings of clubs or organizations: Not on file    Relationship status: Not on file  . Intimate partner violence:    Fear of current or ex partner: Not on file    Emotionally abused: Not on file    Physically abused: Not on file    Forced sexual activity: Not on file  Other Topics Concern  . Not on file  Social History Narrative   Ms. Utke lives in [place] with her husband.  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 She is from [place].    Review of Systems  Genitourinary:       Loss of sexual interest.   Musculoskeletal: Positive for joint swelling.    PHYSICAL EXAMINATION:    BP 115/62   Pulse 74   Resp 14   Ht 5\' 8"  (1.727 m)   Wt 212 lb (96.2 kg)   LMP 03/28/1996 (Within Months)   BMI 32.23 kg/m     General appearance: alert, cooperative and appears stated age   Pelvic: External genitalia:  no lesions              Urethra:  normal appearing urethra with no masses, tenderness or lesions              Bartholins and Skenes: normal                 Vagina:  Small 7 x 3 mm erosion at vagina apex.  Apex well supported.  Second degree midrectocele.  Good anterior vaginal wall support.               Cervix:  absent                Bimanual Exam:  Uterus:   absent              Adnexa: no mass, fullness, tenderness              Rectal exam: Yes.  .  Confirms.              Anus:  normal sphincter tone, no lesions  Chaperone was present for exam.  ASSESSMENT  Status post abdominal sacrocolpopexy with anterior and posterior colporrhaphy, TVT Exact and cystoscopy.  Rectocele.  Back pain.  I do not think her sacrocolpopexy  or rectocele are the cause of her pain.   PLAN  We reviewed options for care including pessary or surgery. We discussed rectocele repair in the future if desired.  The exposed mesh fibers could be removed at the same.  Avoid straining and heavy lifting.  Questions invited and answered.   An After Visit Summary was printed and given to the patient.  ___25___ minutes face to face time of which over 50% was spent in counseling.

## 2018-10-29 ENCOUNTER — Telehealth: Payer: Self-pay

## 2018-10-29 NOTE — Telephone Encounter (Signed)
Message left on voice mail instructing patient to call office to schedule an appointment for refills on thyroid medication.

## 2018-10-30 ENCOUNTER — Other Ambulatory Visit: Payer: Self-pay

## 2018-10-30 MED ORDER — LEVOTHYROXINE SODIUM 25 MCG PO TABS
25.0000 ug | ORAL_TABLET | Freq: Every day | ORAL | 0 refills | Status: DC
Start: 2018-10-30 — End: 2018-11-23

## 2018-11-01 DIAGNOSIS — M961 Postlaminectomy syndrome, not elsewhere classified: Secondary | ICD-10-CM | POA: Insufficient documentation

## 2018-11-01 DIAGNOSIS — M5136 Other intervertebral disc degeneration, lumbar region: Secondary | ICD-10-CM | POA: Insufficient documentation

## 2018-11-12 ENCOUNTER — Ambulatory Visit: Payer: BC Managed Care – PPO | Admitting: Family Medicine

## 2018-11-12 ENCOUNTER — Encounter: Payer: Self-pay | Admitting: Family Medicine

## 2018-11-12 VITALS — BP 122/78 | HR 50 | Temp 98.1°F | Resp 16 | Ht 68.0 in | Wt 215.0 lb

## 2018-11-12 DIAGNOSIS — R509 Fever, unspecified: Secondary | ICD-10-CM | POA: Diagnosis not present

## 2018-11-12 DIAGNOSIS — R8290 Unspecified abnormal findings in urine: Secondary | ICD-10-CM

## 2018-11-12 DIAGNOSIS — E039 Hypothyroidism, unspecified: Secondary | ICD-10-CM | POA: Diagnosis not present

## 2018-11-12 DIAGNOSIS — R61 Generalized hyperhidrosis: Secondary | ICD-10-CM

## 2018-11-12 DIAGNOSIS — R5383 Other fatigue: Secondary | ICD-10-CM

## 2018-11-12 DIAGNOSIS — Z8679 Personal history of other diseases of the circulatory system: Secondary | ICD-10-CM

## 2018-11-12 NOTE — Progress Notes (Signed)
Cynthia [name] , Sep 05, 1956, 62 y.o., female MRN: [id number] Patient Care Team    Relationship Specialty Notifications Start End  Ma Hillock, DO PCP - General Family Medicine  07/14/15   Rosana Berger, MD Consulting Physician Gastroenterology  06/29/15   Melina Schools, MD Consulting Physician Orthopedic Surgery  04/13/16     Chief Complaint  Patient presents with  . Follow-up    [facility]     Subjective: Pt presents for an OV follow-up on her hypothyroidism.  She also reports multiple other complaints today.  Hypothyroidism: Patient reports compliance with levothyroxine 25 mcg daily on empty stomach daily.  She does endorse increased episodes of sweating that occurs nightly and sometimes during class.  She was started on Lexapro 10 mg daily for flushing, which she stated was working well for a while but lately has not been effective.  He has gained 10 pounds secondary to not being able to exercise due to her low back and knee pain.  Her knee orthopedic doctor wanted to wait until after her low back improved before proceeding with surgery on her knee.  However her back flares are secondary to her compensation of her knee.  She is worried the mesh used during her gynecological procedure is creating her back pain, but she has spoke to her gynecologist about this and was told that should not be a concern.  She was worried she had an infection secondary to mesh, she talk to her gynecologist about that and was told she did not have an infection.  She reports "kidney pain" with concentrated urine a few days ago, this has somewhat resolved but has her concerned.  She admits to intermittent chest "tightness "which is associated with diaphoresis during her classes over the last month, but nothing substantial that made her seek treatment.  Reports she just has not felt right over the last few weeks.  Did have some URI-like symptoms and was seen at an urgent care, she reports she was told she was healthy  but given Augmentin, which did seem to help with her symptoms and her ear discomfort. She had a couple family members and friends recently passed away secondary to different conditions including heart conditions and this has her concerned.  Depression screen [facility] 2/9 06/20/2018 08/16/2017 05/25/2016  Decreased Interest 0 0 0  Down, Depressed, Hopeless 0 0 0  PHQ - 2 Score 0 0 0    No Known Allergies Social History   Tobacco Use  . Smoking status: Never Smoker  . Smokeless tobacco: Never Used  Substance Use Topics  . Alcohol use: Yes    Alcohol/week: 4.0 standard drinks    Types: 4 Glasses of wine per week   Past Medical History:  Diagnosis Date  . Arthritis   . Cancer ([place])    skin on nose  . Cervical disc disease   . Cystocele   . Heart murmur   . History of adenomatous polyp of colon 06/24/15  . History of rheumatic fever    childhood  . Hypothyroidism   . 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 surveillance  . Mitral valve prolapse   . PONV (postoperative nausea and vomiting)   . Primary osteoarthritis of right hip    Intraarticular steroid injection by Dr. Nelva Bush 01/2016  . Rectocele   . SA node dysfunction [facility])    Past Surgical History:  Procedure Laterality Date  . ABDOMINAL HYSTERECTOMY  05/1996  TVH--ovaries remain  . ABDOMINAL SACROCOLPOPEXY N/A 06/13/2017   Procedure: ABDOMINO SACROCOLPOPEXY with Halbans culdoplasty;  Surgeon: Nunzio Cobbs, MD;  Location: [facility];  Service: Gynecology;  Laterality: N/A;  . ANTERIOR AND POSTERIOR REPAIR N/A 06/13/2017   Procedure: ANTERIOR (CYSTOCELE) AND POSTERIOR REPAIR (RECTOCELE);  Surgeon: Nunzio Cobbs, MD;  Location: [facility];  Service: Gynecology;  Laterality: N/A;  . arthroscopic knee Right   . BACK SURGERY  2012   disc rupture  . BILATERAL SALPINGECTOMY Bilateral 06/13/2017   Procedure: BILATERAL SALPINGECTOMY;  Surgeon: Nunzio Cobbs, MD;  Location: [facility];   Service: Gynecology;  Laterality: Bilateral;  . BLADDER SUSPENSION N/A 06/13/2017   Procedure: TRANSVAGINAL TAPE (TVT) PROCEDURE;  Surgeon: Nunzio Cobbs, MD;  Location: [facility];  Service: Gynecology;  Laterality: N/A;  . COLONOSCOPY W/ POLYPECTOMY  06/24/15   Recall 5 yrs ([facility]. [facility])  . CYSTOSCOPY N/A 06/13/2017   Procedure: CYSTOSCOPY;  Surgeon: Nunzio Cobbs, MD;  Location: [facility];  Service: Gynecology;  Laterality: N/A;  . KNEE RECONSTRUCTION Right 2008  . LIVER BIOPSY  1997   fatty tumors   Family History  Problem Relation Age of Onset  . Alcohol abuse Mother   . CVA Mother        Dec 65  . Heart disease Mother   . Cancer Father        prostate  . Arthritis Father   . Heart murmur Son   . Alcohol abuse Sister   . Heart disease Brother        congenital heart defect--Dec age 53 from MI  . Heart murmur Son   . Heart murmur Son   . Cancer Maternal Aunt 50       Dec colon CA age 84  . Cancer Cousin 50       Dec colon CA age 11   Allergies as of 11/12/2018   No Known Allergies     Medication List       Accurate as of November 12, 2018  3:59 PM. Always use your most recent med list.        calcium gluconate 500 MG tablet Take 1 tablet by mouth 3 (three) times daily.   conjugated estrogens vaginal cream Commonly known as:  PREMARIN USE 1/2 G VAGINALLY 2 TIMES PER WEEK   escitalopram 10 MG tablet Commonly known as:  LEXAPRO Take 1 tablet (10 mg total) by mouth daily. Needs appointment for further refills   fluticasone 50 MCG/ACT nasal spray Commonly known as:  FLONASE Place 2 sprays into both nostrils daily.   levothyroxine 25 MCG tablet Commonly known as:  LEVOTHROID Take 1 tablet (25 mcg total) by mouth daily before breakfast. Need office visit prior to any additional refills   meloxicam 15 MG tablet Commonly known as:  MOBIC Take 1 tablet (15 mg total) by mouth daily.       All past medical history,  surgical history, allergies, family history, immunizations andmedications were updated in the EMR today and reviewed under the history and medication portions of their EMR.     ROS: Negative, with the exception of above mentioned in HPI   Objective:  BP 122/78 (BP Location: Left Arm, Patient Position: Sitting, Cuff Size: Large)   Pulse (!) 50   Temp 98.1 F (36.7 C) (Oral)   Resp 16   Ht '5\' 8"'$  (1.727 m)   Wt 215 lb (  97.5 kg)   LMP 03/28/1996 (Within Months)   SpO2 100%   BMI 32.69 kg/m  Body mass index is 32.69 kg/m. Gen: Afebrile. No acute distress. Nontoxic in appearance, well developed, well nourished.  Appears fatigued. HENT: AT. [place]. Bilateral TM visualized without erythema or bulging. MMM, no oral lesions. Bilateral nares without erythema, drainage or swelling. Throat without erythema or exudates.  No Cough, no hoarseness. Eyes:Pupils Equal Round Reactive to light, Extraocular movements intact,  Conjunctiva without redness, discharge or icterus. Neck/lymp/endocrine: Supple, no lymphadenopathy, no thyromegaly CV: RRR no murmur, no  edema Chest: CTAB, no wheeze or crackles. Good air movement, normal resp effort.  Abd: Soft.  Obese.  BS present.  Skin: no rashes, purpura or petechiae.  Neuro:  Normal gait. PERLA. EOMi. Alert. Oriented x3  Psych: Normal affect, dress and demeanor. Normal speech. Normal thought content and judgment.  No exam data present No results found. No results found for this or any previous visit (from the past 24 hour(s)).  Assessment/Plan: CYNTHIE GARMON is a 62 y.o. female present for OV for  Hypothyroidism, unspecified type -Refills on levothyroxine will be provided after results obtained. - TSH -Follow up yearly if normal.  Urine abnormality Rule out UTI or hematuria - Urinalysis, Routine w reflex microscopic - CBC w/Diff, BMP  Diaphoresis/fatigue/fever -Patient appears fatigued on exam today her heart rate is mildly bradycardic, which  she has been in the past but is not her usual heart rate.  She has multiple complaints, not certain they are all connected.  This was discussed with her today.  I do have concerns with the diaphoresis and the fatigue possibly connected to either a cardiology issue or hypoglycemia/endocrine disorder. - TSH - Urinalysis, Routine w reflex microscopic - CBC w/Diff - Comp Met (CMET)  Personal history of cardiac arrhythmia - low threshold to refer to cardiology for further evaluation given symptoms. If labs do not indicate other cause will refer. Pt agreeable.   Follow-up dependent upon lab results.   Reviewed expectations re: course of current medical issues.  Discussed self-management of symptoms.  Outlined signs and symptoms indicating need for more acute intervention.  Patient verbalized understanding and all questions were answered.  Patient received an After-Visit Summary.   > 25 minutes spent with patient, >50% of time spent face to face    No orders of the defined types were placed in this encounter.    Note is dictated utilizing voice recognition software. Although note has been proof read prior to signing, occasional typographical errors still can be missed. If any questions arise, please do not hesitate to call for verification.   electronically signed by:  Howard Pouch, DO  [place]

## 2018-11-12 NOTE — Patient Instructions (Signed)
Once we get your labs back, we will narrow down plan. We are checking your blood count, liver/kidney function, urine and thyroid today.   Please help [place] help you:  We are honored you have chosen [facility] for your Primary Care home. Below you will find basic instructions that you may need to access in the future. Please help [place] help you by reading the instructions, which cover many of the frequent questions we experience.   Prescription refills and request:  -In order to allow more efficient response time, please call your pharmacy for all refills. They will forward the request electronically to [place]. This allows for the quickest possible response. Request left on a nurse line can take longer to refill, since these are checked as time allows between office patients and other phone calls.  - refill request can take up to 3-5 working days to complete.  - If request is sent electronically and request is appropiate, it is usually completed in 1-2 business days.  - all patients will need to be seen routinely for all chronic medical conditions requiring prescription medications (see follow-up below). If you are overdue for follow up on your condition, you will be asked to make an appointment and we will call in enough medication to cover you until your appointment (up to 30 days).  - all controlled substances will require a face to face visit to request/refill.  - if you desire your prescriptions to go through a new pharmacy, and have an active script at original pharmacy, you will need to call your pharmacy and have scripts transferred to new pharmacy. This is completed between the pharmacy locations and not by your provider.    Results: If any images or labs were ordered, it can take up to 1 week to get results depending on the test ordered and the lab/facility running and resulting the test. - Normal or stable results, which do not need further discussion, may be released to your mychart  immediately with attached note to you. A call may not be generated for normal results. Please make certain to sign up for mychart. If you have questions on how to activate your mychart you can call the front office.  - If your results need further discussion, our office will attempt to contact you via phone, and if unable to reach you after 2 attempts, we will release your abnormal result to your mychart with instructions.  - All results will be automatically released in mychart after 1 week.  - Your provider will provide you with explanation and instruction on all relevant material in your results. Please keep in mind, results and labs may appear confusing or abnormal to the untrained eye, but it does not mean they are actually abnormal for you personally. If you have any questions about your results that are not covered, or you desire more detailed explanation than what was provided, you should make an appointment with your provider to do so.   Our office handles many outgoing and incoming calls daily. If we have not contacted you within 1 week about your results, please check your mychart to see if there is a message first and if not, then contact our office.  In helping with this matter, you help decrease call volume, and therefore allow [place] to be able to respond to patients needs more efficiently.   Acute office visits (sick visit):  An acute visit is intended for a new problem and are scheduled in shorter time slots  to allow schedule openings for patients with new problems. This is the appropriate visit to discuss a new problem. Problems will not be addressed by phone call or Echart message. Appointment is needed if requesting treatment. In order to provide you with excellent quality medical care with proper time for you to explain your problem, have an exam and receive treatment with instructions, these appointments should be limited to one new problem per visit. If you experience a new problem, in  which you desire to be addressed, please make an acute office visit, we save openings on the schedule to accommodate you. Please do not save your new problem for any other type of visit, let us take care of it properly and quickly for you.   Follow up visits:  Depending on your condition(s) your provider will need to see you routinely in order to provide you with quality care and prescribe medication(s). Most chronic conditions (Example: hypertension, Diabetes, depression/anxiety... etc), require visits a couple times a year. Your provider will instruct you on proper follow up for your personal medical conditions and history. Please make certain to make follow up appointments for your condition as instructed. Failing to do so could result in lapse in your medication treatment/refills. If you request a refill, and are overdue to be seen on a condition, we will always provide you with a 30 day script (once) to allow you time to schedule.    Medicare wellness (well visit): - we have a wonderful Nurse Maudie Mercury), that will meet with you and provide you will yearly medicare wellness visits. These visits should occur yearly (can not be scheduled less than 1 calendar year apart) and cover preventive health, immunizations, advance directives and screenings you are entitled to yearly through your medicare benefits. Do not miss out on your entitled benefits, this is when medicare will pay for these benefits to be ordered for you.  These are strongly encouraged by your provider and is the appropriate type of visit to make certain you are up to date with all preventive health benefits. If you have not had your medicare wellness exam in the last 12 months, please make certain to schedule one by calling the office and schedule your medicare wellness with Maudie Mercury as soon as possible.   Yearly physical (well visit):  - Adults are recommended to be seen yearly for physicals. Check with your insurance and date of your last physical,  most insurances require one calendar year between physicals. Physicals include all preventive health topics, screenings, medical exam and labs that are appropriate for gender/age and history. You may have fasting labs needed at this visit. This is a well visit (not a sick visit), new problems should not be covered during this visit (see acute visit).  - Pediatric patients are seen more frequently when they are younger. Your provider will advise you on well child visit timing that is appropriate for your their age. - This is not a medicare wellness visit. Medicare wellness exams do not have an exam portion to the visit. Some medicare companies allow for a physical, some do not allow a yearly physical. If your medicare allows a yearly physical you can schedule the medicare wellness with our nurse Maudie Mercury and have your physical with your provider after, on the same day. Please check with insurance for your full benefits.   Late Policy/No Shows:  - all new patients should arrive 15-30 minutes earlier than appointment to allow [place] time  to  obtain all personal demographics,  insurance information and for you to complete office paperwork. - All established patients should arrive 10-15 minutes earlier than appointment time to update all information and be checked in .  - In our best efforts to run on time, if you are late for your appointment you will be asked to either reschedule or if able, we will work you back into the schedule. There will be a wait time to work you back in the schedule,  depending on availability.  - If you are unable to make it to your appointment as scheduled, please call 24 hours ahead of time to allow [place] to fill the time slot with someone else who needs to be seen. If you do not cancel your appointment ahead of time, you may be charged a no show fee.

## 2018-11-13 ENCOUNTER — Encounter: Payer: Self-pay | Admitting: Family Medicine

## 2018-11-13 ENCOUNTER — Telehealth: Payer: Self-pay | Admitting: Family Medicine

## 2018-11-13 DIAGNOSIS — Z8249 Family history of ischemic heart disease and other diseases of the circulatory system: Secondary | ICD-10-CM

## 2018-11-13 DIAGNOSIS — E162 Hypoglycemia, unspecified: Secondary | ICD-10-CM

## 2018-11-13 DIAGNOSIS — R61 Generalized hyperhidrosis: Secondary | ICD-10-CM

## 2018-11-13 DIAGNOSIS — Z8679 Personal history of other diseases of the circulatory system: Secondary | ICD-10-CM

## 2018-11-13 DIAGNOSIS — R0789 Other chest pain: Secondary | ICD-10-CM

## 2018-11-13 LAB — COMPREHENSIVE METABOLIC PANEL
ALBUMIN: 4.8 g/dL (ref 3.5–5.2)
ALK PHOS: 87 U/L (ref 39–117)
ALT: 15 U/L (ref 0–35)
AST: 16 U/L (ref 0–37)
BILIRUBIN TOTAL: 0.4 mg/dL (ref 0.2–1.2)
BUN: 23 mg/dL (ref 6–23)
CALCIUM: 9.7 mg/dL (ref 8.4–10.5)
CO2: 24 mEq/L (ref 19–32)
CREATININE: 0.64 mg/dL (ref 0.40–1.20)
Chloride: 103 mEq/L (ref 96–112)
GFR: 99.91 mL/min (ref >60.00)
Glucose, Bld: 49 mg/dL — CL (ref 70–99)
Potassium: 4.5 mEq/L (ref 3.5–5.1)
SODIUM: 140 meq/L (ref 135–145)
TOTAL PROTEIN: 6.9 g/dL (ref 6.0–8.3)

## 2018-11-13 LAB — TSH: TSH: 3.13 u[IU]/mL (ref 0.35–4.50)

## 2018-11-13 MED ORDER — GLUCOSE 4 G PO CHEW: 1.0000 | CHEWABLE_TABLET | ORAL | 0 refills | Status: DC | PRN

## 2018-11-13 NOTE — Telephone Encounter (Signed)
Attempted to call patient to discuss the lab values we have returned. I left a detailed message on her answering machine (per DPR) - hypoglycemic: Consume 3 meals a day, with small protein snack between meals and 1 snack before bed.  Glucose tablets prescribed to use if become symptomatic. -Cardiology and endocrinology referrals placed urgently. -Other labs will be collected in the meantime, glucose, insulin, C-peptide, cortisol, , proinsulin, BHOB. -Patient encouraged to go to the emergency room with any chest pain or dizziness.  Please call her back in the morning- or if when she returns call- please tell her to fast 8-10 hours for labs. She may want to get someone to drive her.

## 2018-11-13 NOTE — Telephone Encounter (Signed)
[facility] called to report a critical glucose. The glucose lab was checked yesterday (11/12/18 at 1627)  with a result of 34 yesterday. Dr. Alain Marion notified of critical lab. No orders. Attempted to call patient, no answer.

## 2018-11-14 LAB — CBC WITH DIFFERENTIAL/PLATELET
Absolute Monocytes: 540 cells/uL (ref 200–950)
BASOS ABS: 52 {cells}/uL (ref 0–200)
BASOS PCT: 0.7 %
EOS ABS: 259 {cells}/uL (ref 15–500)
EOS PCT: 3.5 %
HEMATOCRIT: 37.5 % (ref 35.0–45.0)
HEMOGLOBIN: 13 g/dL (ref 11.7–15.5)
LYMPHS ABS: 2930 {cells}/uL (ref 850–3900)
MCH: 30.8 pg (ref 27.0–33.0)
MCHC: 34.7 g/dL (ref 32.0–36.0)
MCV: 88.9 fL (ref 80.0–100.0)
MPV: 9.6 fL (ref 7.5–12.5)
Monocytes Relative: 7.3 %
Neutro Abs: 3619 cells/uL (ref 1500–7800)
Neutrophils Relative %: 48.9 %
Platelets: 276 10*3/uL (ref 140–400)
RBC: 4.22 10*6/uL (ref 3.80–5.10)
RDW: 13 % (ref 11.0–15.0)
Total Lymphocyte: 39.6 %
WBC: 7.4 10*3/uL (ref 3.8–10.8)

## 2018-11-14 LAB — TIQ-MISC

## 2018-11-14 LAB — COMPREHENSIVE METABOLIC PANEL

## 2018-11-14 NOTE — Telephone Encounter (Signed)
Patient notified, verbalized understanding and was in the process of calling back to schedule a lab appt through the call center. Lab appointment was scheduled by me for patient and labs were discussed with patient per Dr. Lucita Lora note.

## 2018-11-14 NOTE — Telephone Encounter (Signed)
Dr Kuneff aware. 

## 2018-11-15 ENCOUNTER — Other Ambulatory Visit (INDEPENDENT_AMBULATORY_CARE_PROVIDER_SITE_OTHER): Payer: BC Managed Care – PPO

## 2018-11-15 DIAGNOSIS — R61 Generalized hyperhidrosis: Secondary | ICD-10-CM

## 2018-11-15 DIAGNOSIS — E162 Hypoglycemia, unspecified: Secondary | ICD-10-CM | POA: Diagnosis not present

## 2018-11-15 LAB — BASIC METABOLIC PANEL
BUN: 16 mg/dL (ref 6–23)
CO2: 24 mEq/L (ref 19–32)
CREATININE: 0.68 mg/dL (ref 0.40–1.20)
Calcium: 9.3 mg/dL (ref 8.4–10.5)
Chloride: 106 mEq/L (ref 96–112)
GFR: 93.16 mL/min (ref >60.00)
Glucose, Bld: 103 mg/dL — ABNORMAL HIGH (ref 70–99)
Potassium: 4.3 mEq/L (ref 3.5–5.1)
Sodium: 141 mEq/L (ref 135–145)

## 2018-11-15 LAB — CORTISOL: Cortisol, Plasma: 9.6 ug/dL

## 2018-11-16 LAB — INSULIN AND C-PEPTIDE, SERUM
C-Peptide: 2.9 ng/mL (ref 1.1–4.4)
INSULIN: 12 u[IU]/mL (ref 2.6–24.9)

## 2018-11-19 ENCOUNTER — Telehealth: Payer: Self-pay | Admitting: Family Medicine

## 2018-11-19 ENCOUNTER — Ambulatory Visit: Payer: BC Managed Care – PPO | Admitting: Adult Health

## 2018-11-19 NOTE — Progress Notes (Signed)
Cardiology Office Note   Date:  11/20/2018   ID:  ODETTE [name], DOB [birth year], MRN [id number]  PCP:  Ma Hillock, DO  Cardiologist: Dr. Debara Pickett Chief Complaint  Patient presents with  . Follow-up     History of Present Illness: Cynthia [name] is a 62 y.o. female who presents for ongoing assessment and management of mitral valve prolapse and now with complaints of chest tightness.   She has a strong FH of thoracoabdominal aneurysm and fatal cerebral aneurysm in her mother.She was recommended to be screened for thoracic with ultrasound or MRI and echocardiogram. Ultrasound was negative for AAA. She has a strong family history of premature CAD in her brother who died at age 24, and father with CAD, deceased from a MI.  Echo revealed normal LVEF with grade 1 diastolic dysfunction.   She was last seen in the office by Dr. Debara Pickett on 03/14/2018 for pre-operative clearance and was found to be a low risk to proceed with surgery. Since the surgery, she states she has had problems with "mesh rejection" and hypoglycemia, her PCP is doing a full work up and possible referral to Endocrinologist. She also has ongoing back pain and right knee pain.   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 Not sure if this was because of low blood sugar or not. She had not eaten since breakfast and this was just before lunch. She has had no further episodes. She was told by her PCP to follow up with cardiology for these symptoms.    Past Medical History:  Diagnosis Date  . Arthritis   . Cancer ([place])    skin on nose  . Cervical disc disease   . Cystocele   . Heart murmur   . History of adenomatous polyp of colon 06/24/15  . History of rheumatic fever    childhood  . Hypothyroidism   . 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 surveillance  . Mitral valve prolapse   . PONV (postoperative nausea and vomiting)   . Primary  osteoarthritis of right hip    Intraarticular steroid injection by Dr. Nelva Bush 01/2016  . Rectocele   . SA node dysfunction [facility])     Past Surgical History:  Procedure Laterality Date  . ABDOMINAL HYSTERECTOMY  05/1996   TVH--ovaries remain  . ABDOMINAL SACROCOLPOPEXY N/A 06/13/2017   Procedure: ABDOMINO SACROCOLPOPEXY with [place] culdoplasty;  Surgeon: Nunzio Cobbs, MD;  Location: [facility];  Service: Gynecology;  Laterality: N/A;  . ANTERIOR AND POSTERIOR REPAIR N/A 06/13/2017   Procedure: ANTERIOR (CYSTOCELE) AND POSTERIOR REPAIR (RECTOCELE);  Surgeon: Nunzio Cobbs, MD;  Location: [facility];  Service: Gynecology;  Laterality: N/A;  . arthroscopic knee Right   . BACK SURGERY  2012   disc rupture  . BILATERAL SALPINGECTOMY Bilateral 06/13/2017   Procedure: BILATERAL SALPINGECTOMY;  Surgeon: Nunzio Cobbs, MD;  Location: [facility];  Service: Gynecology;  Laterality: Bilateral;  . BLADDER SUSPENSION N/A 06/13/2017   Procedure: TRANSVAGINAL TAPE (TVT) PROCEDURE;  Surgeon: Nunzio Cobbs, MD;  Location: [facility];  Service: Gynecology;  Laterality: N/A;  . COLONOSCOPY W/ POLYPECTOMY  06/24/15   Recall 5 yrs (Dr[facility])  . CYSTOSCOPY N/A 06/13/2017   Procedure: CYSTOSCOPY;  Surgeon: Nunzio Cobbs, MD;  Location: [facility];  Service: Gynecology;  Laterality: N/A;  . KNEE  RECONSTRUCTION Right 2008  . LIVER BIOPSY  1997   fatty tumors     Current Outpatient Medications  Medication Sig Dispense Refill  . calcium gluconate 500 MG tablet Take 1 tablet by mouth 3 (three) times daily.    Marland Kitchen conjugated estrogens (PREMARIN) vaginal cream USE 1/2 G VAGINALLY 2 TIMES PER WEEK 30 g 1  . escitalopram (LEXAPRO) 10 MG tablet Take 1 tablet (10 mg total) by mouth daily. Needs appointment for further refills 30 tablet 0  . fluticasone (FLONASE) 50 MCG/ACT nasal spray Place 2 sprays into both nostrils daily. 16 g 6  . glucose 4 GM chewable  tablet Chew 1 tablet (4 g total) by mouth as needed for low blood sugar. 60 tablet 0  . levothyroxine (LEVOTHROID) 25 MCG tablet Take 1 tablet (25 mcg total) by mouth daily before breakfast. Need office visit prior to any additional refills 14 tablet 0  . meloxicam (MOBIC) 15 MG tablet Take 1 tablet (15 mg total) by mouth daily. 90 tablet 3   No current facility-administered medications for this visit.     Allergies:   Patient has no known allergies.    Social History:  The patient  reports that she has never smoked. She has never used smokeless tobacco. She reports current alcohol use of about 4.0 standard drinks of alcohol per week. She reports that she does not use drugs.   Family History:  The patient's family history includes Alcohol abuse in her mother and sister; Arthritis in her father; CVA in her mother; Cancer in her father; Cancer (age of onset: 59) in her cousin and maternal aunt; Heart disease in her brother and mother; Heart murmur in her son, son, and son.    ROS: All other systems are reviewed and negative. Unless otherwise mentioned in H&P    PHYSICAL EXAM: VS:  BP 118/74   Pulse (!) 58   Ht 5\' 8"  (1.727 m)   Wt 215 lb 12.8 oz (97.9 kg)   LMP 03/28/1996 (Within Months)   BMI 32.81 kg/m  , BMI Body mass index is 32.81 kg/m. GEN: Well nourished, well developed, in no acute distress HEENT: normal Neck: no JVD, carotid bruits, or masses Cardiac:RRR, bradycardic, soft systolic murmurs, rubs, or gallops,no edema  Respiratory:  Clear to auscultation bilaterally, normal work of breathing GI: soft, nontender, nondistended, + BS MS: no deformity or atrophy Skin: warm and dry, no rash Neuro:  Strength and sensation are intact Psych: euthymic mood, full affect   EKG: Sinus bradycardia rate of 58 bpm. With 1st degree AV block.  Recent Labs: 11/12/2018: ALT 15; Hemoglobin 13.0; Platelets 276; TSH 3.13 11/15/2018: BUN 16; Creatinine, Ser 0.68; Potassium 4.3; Sodium 141     Lipid Panel    Component Value Date/Time   CHOL 181 08/16/2017 0908   TRIG 122.0 08/16/2017 0908   TRIG 103 12/17/2012   HDL 72.10 08/16/2017 0908   CHOLHDL 3 08/16/2017 0908   VLDL 24.4 08/16/2017 0908   LDLCALC 85 08/16/2017 0908   LDLDIRECT 113 12/17/2012      Wt Readings from Last 3 Encounters:  11/20/18 215 lb 12.8 oz (97.9 kg)  11/12/18 215 lb (97.5 kg)  10/08/18 212 lb (96.2 kg)      Other studies Reviewed: MRA/MRI of the head 04/22/2017  Unremarkable head MRA aside from normal variant anatomy. No aneurysm.  Echocardiogram 03/24/2018 - Left ventricle: The cavity size was normal. Wall thickness was   normal. Systolic function was normal. The estimated  ejection   fraction was in the range of 55% to 60%. Wall motion was normal;  there were no regional wall motion abnormalities. Doppler parameters are consistent with abnormal left ventricular  relaxation (grade 1 diastolic dysfunction). Impressions: Normal LV systolic function; mild diastolic dysfunction; trace   MR; mild TR.  ASSESSMENT AND PLAN:  1. Chest pain: Uncertain etiology with CVRF of FH, thyroid disease, obesity, age.  Will order a NM stress test for diagnostic/prognostic purposes. She may need to have surgery for repair of right knee and back surgery in the coming year.   2. Chronic back pain: Having steroid injection today by orthopedics.   3. Hypoglycemia: She is being followed by PCP for work up and possible referral to Endocrinologist.   4. Bradycardia: Currently asymptomatic for dizziness, near syncope or DOE. Uncertain if chest tightness is related to HR.   Current medicines are reviewed at length with the patient today.  Patent would like to be followed by Dr. Sallyanne Kuster as she has only been seen once by Dr. 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 Will notify both physicians of her choice.   Labs/ tests ordered today include: NM Stress test  Phill Myron. [place] Pugh, ANP, AACC   11/20/2018 8:10 AM    [address] Office [phone] Fax [phone]

## 2018-11-19 NOTE — Telephone Encounter (Signed)
Please inform patient the following information: So far the additional work up is normal. Still waiting one lab to return. It will likely be after xmas before we get that particular lab back- just wanted to keep her updated.

## 2018-11-20 ENCOUNTER — Encounter: Payer: Self-pay | Admitting: Adult Health

## 2018-11-20 ENCOUNTER — Ambulatory Visit: Payer: BC Managed Care – PPO | Admitting: Adult Health

## 2018-11-20 VITALS — BP 118/74 | HR 58 | Ht 68.0 in | Wt 215.8 lb

## 2018-11-20 DIAGNOSIS — R079 Chest pain, unspecified: Secondary | ICD-10-CM | POA: Diagnosis not present

## 2018-11-20 DIAGNOSIS — R001 Bradycardia, unspecified: Secondary | ICD-10-CM | POA: Diagnosis not present

## 2018-11-20 DIAGNOSIS — I341 Nonrheumatic mitral (valve) prolapse: Secondary | ICD-10-CM | POA: Diagnosis not present

## 2018-11-20 NOTE — Patient Instructions (Signed)
Testing/Procedures: Your physician has requested that you have an Exercise Myoview. A cardiac stress test is a cardiological test that measures the heart's ability to respond to external stress in a controlled clinical environment. The stress response is induced by exercise (exercise-treadmill). For further information please visit [email or web address]. If you have questions or concerns about your appointment, you can call the Nuclear Lab at [phone].   Follow-Up: You will need a follow up appointment in [place] .  You may see DR Sallyanne Kuster or one of the following Advanced Practice Providers on your designated Care Team:   Almyra Deforest, PA-C   Fabian Sharp, [place]  Medication Instructions:  NO CHANGES- Your physician recommends that you continue on your current medications as directed. Please refer to the Current Medication list given to you today. If you need a refill on your cardiac medications before your next appointment, please call your pharmacy. Labwork: NONE ORDERED If you have labs (blood work) drawn today and your tests are completely normal, you will receive your results only by MyChart Message (if you have MyChart) -OR- A paper copy in the mail.  If you have any lab test that is abnormal or we need to change your treatment, we will call you to review these results.  At [facility], you and your health needs are our priority.  As part of our continuing mission to provide you with exceptional heart care, we have created designated Provider Care Teams.  These Care Teams include your primary Cardiologist (physician) and Advanced Practice Providers (APPs -  Physician Assistants and Nurse Practitioners) who all work together to provide you with the care you need, when you need it.

## 2018-11-20 NOTE — Telephone Encounter (Signed)
Pt.notified

## 2018-11-22 ENCOUNTER — Telehealth (HOSPITAL_COMMUNITY): Payer: Self-pay

## 2018-11-22 NOTE — Telephone Encounter (Signed)
Encounter complete. 

## 2018-11-23 ENCOUNTER — Ambulatory Visit (HOSPITAL_COMMUNITY)
Admission: RE | Admit: 2018-11-23 | Discharge: 2018-11-23 | Disposition: A | Discharge status: Home / Self Care | Payer: BC Managed Care – PPO | Source: Ambulatory Visit | Attending: Adult Health | Admitting: Adult Health

## 2018-11-23 ENCOUNTER — Other Ambulatory Visit: Payer: Self-pay | Admitting: Family Medicine

## 2018-11-23 ENCOUNTER — Telehealth: Payer: Self-pay

## 2018-11-23 DIAGNOSIS — R079 Chest pain, unspecified: Secondary | ICD-10-CM | POA: Diagnosis present

## 2018-11-23 LAB — MYOCARDIAL PERFUSION IMAGING
CHL CUP NUCLEAR SDS: 3
CHL CUP RESTING HR STRESS: 49 {beats}/min
CSEPHR: 101 %
Estimated workload: 10.4 METS
Exercise duration (min): 8 min
Exercise duration (sec): 16 s
LV dias vol: 95 mL (ref 46–106)
LV sys vol: 36 mL
MPHR: 158 {beats}/min
Peak HR: 160 {beats}/min
RPE: 19
SRS: 2
SSS: 5
TID: 1.06

## 2018-11-23 LAB — PROINSULIN: Proinsulin: 8.2 pmol/L

## 2018-11-23 MED ORDER — LEVOTHYROXINE SODIUM 25 MCG PO TABS: 25.0000 ug | ORAL_TABLET | Freq: Every day | ORAL | 2 refills | Status: DC

## 2018-11-23 MED ORDER — TECHNETIUM TC 99M TETROFOSMIN IV KIT
9.9000 | PACK | Freq: Once | INTRAVENOUS | Status: AC | PRN
  Administered 2018-11-23: 9.9 via INTRAVENOUS
  Filled 2018-11-23: qty 10

## 2018-11-23 MED ORDER — LEVOTHYROXINE SODIUM 25 MCG PO TABS: 25.0000 ug | ORAL_TABLET | Freq: Every day | ORAL | 3 refills | Status: DC

## 2018-11-23 MED ORDER — TECHNETIUM TC 99M TETROFOSMIN IV KIT
29.8000 | PACK | Freq: Once | INTRAVENOUS | Status: AC | PRN
  Administered 2018-11-23: 29.8 via INTRAVENOUS
  Filled 2018-11-23: qty 30

## 2018-11-23 MED ORDER — LEVOTHYROXINE SODIUM 25 MCG PO TABS: 25.0000 ug | ORAL_TABLET | Freq: Every day | ORAL | 1 refills | Status: DC

## 2018-11-23 NOTE — Telephone Encounter (Signed)
Copied from [place] [id number]. Topic: General - Other >> Nov 23, 2018 11:44 AM Yvette Rack wrote: Reason for CRM: Pt stated she was told that her thyroid medication would not be refilled until her lab results came back. Pt stated that the lab results are back and she needs to know whether the levothyroxine (LEVOTHROID) 25 MCG tablet will refilled or if it will be changed to another medication. Pt stated she needs either the doctor or a nurse to return her call to discuss the lab results and the next steps with the medication as she only has a few pills remaining. Pt requests call back. Cb# [phone]

## 2018-11-23 NOTE — Progress Notes (Signed)
Refilled synthroid for 1 year

## 2018-11-23 NOTE — Telephone Encounter (Signed)
Pt calls wanting to have her levothroid medication refilled. Rf 90 days and 1 rf. Pt aware.

## 2018-11-26 NOTE — Progress Notes (Signed)
Thank you. My kids love her! MCr

## 2018-11-26 NOTE — Progress Notes (Signed)
She is being notified that you will accept her.

## 2018-11-30 ENCOUNTER — Encounter: Payer: Self-pay | Admitting: Cardiovascular Disease

## 2018-11-30 ENCOUNTER — Ambulatory Visit: Payer: BC Managed Care – PPO | Admitting: Cardiovascular Disease

## 2018-11-30 VITALS — BP 100/56 | HR 62 | Ht 68.0 in | Wt 215.0 lb

## 2018-11-30 DIAGNOSIS — Z01812 Encounter for preprocedural laboratory examination: Secondary | ICD-10-CM

## 2018-11-30 DIAGNOSIS — Z8679 Personal history of other diseases of the circulatory system: Secondary | ICD-10-CM

## 2018-11-30 DIAGNOSIS — E162 Hypoglycemia, unspecified: Secondary | ICD-10-CM | POA: Diagnosis not present

## 2018-11-30 DIAGNOSIS — R072 Precordial pain: Secondary | ICD-10-CM | POA: Diagnosis not present

## 2018-11-30 DIAGNOSIS — Z8249 Family history of ischemic heart disease and other diseases of the circulatory system: Secondary | ICD-10-CM | POA: Diagnosis not present

## 2018-11-30 NOTE — Patient Instructions (Signed)
Medication Instructions:  Dr Sallyanne Kuster recommends that you continue on your current medications as directed. Please refer to the Current Medication list given to you today.  If you need a refill on your cardiac medications before your next appointment, please call your pharmacy.   Lab work: Your physician recommends that you return for lab work prior to your CT.  If you have labs (blood work) drawn today and your tests are completely normal, you will receive your results only by: Marland Kitchen MyChart Message (if you have MyChart) OR . A paper copy in the mail If you have any lab test that is abnormal or we need to change your treatment, we will call you to review the results.  Testing/Procedures: CT Chest AORTA - Non-Cardiac CT Angiography (CTA), is a special type of CT scan that uses a computer to produce multi-dimensional views of major blood vessels throughout the body. In CT angiography, a contrast material is injected through an IV to help visualize the blood vessels  >> This will be performed at our [facility] location [place], [place] [address] [phone]  Follow-Up: At [facility], you and your health needs are our priority.  As part of our continuing mission to provide you with exceptional heart care, we have created designated Provider Care Teams.  These Care Teams include your primary Cardiologist (physician) and Advanced Practice Providers (APPs -  Physician Assistants and Nurse Practitioners) who all work together to provide you with the care you need, when you need it. You will need a follow up appointment in 12 months.  You may see Sanda Klein, MD or one of the following Advanced Practice Providers on your designated Care Team: [place], [place] . Fabian Sharp, PA-C . You will receive a reminder letter in the mail two months in advance. If you don't receive a letter, please call our office to schedule the follow-up appointment.

## 2018-11-30 NOTE — Progress Notes (Signed)
Cardiology Office Note:    Date:  12/01/2018   ID:  SHERRIL SHIPMAN, DOB 28-Aug-1956, MRN [id number]  PCP:  Ma Hillock, DO  Cardiologist:  Sanda Klein, MD  Electrophysiologist:  None   Referring MD: Ma Hillock, DO   Chief Complaint  Patient presents with  . Follow-up    Post nuc.  Chest pain  History of Present Illness:    ILYANNA BAILLARGEON is a 63 y.o. female with a hx of childhood rheumatic fever who presents for follow-up after undergoing a nuclear stress test for chest pain.  Study was normal without any evidence of coronary insufficiency.  She really does not have any coronary risk factors (she does not have hypertension, hyperlipidemia, diabetes or family history of clear-cut coronary problems).    Her family history is more concerning for possible thoracic aneurysms.  Her brother died of a "heart attack" at age 45 when the aneurysm ruptured around his heart.  Her maternal uncle died suddenly of a heart problem at age 35.  Her maternal first cousin, female gender, had an aneurysm at age 90.  The patient's mother died of a brain aneurysm, probably unrelated.  Her maternal grandmother had an abdominal aortic aneurysm and was a smoker.  She was able to exercise for over 8 minutes without ECG changes or angina on the treadmill, with normal perfusion images and normal LVEF.  She tends to have a relatively slow resting heart beat (49 bpm on the day of the test) and a relatively low systolic blood pressure (usually in the 90s).,  But does not have symptoms of bradycardia or hypotension..  She had an echocardiogram in April 2018 that shows normal left ventricular size and systolic function, normal diastolic function, 3 leaflet aortic valve that appears to be normal and there is normal size aortic root with limited visibility.  Cannot exclude a minor case of mitral valve prolapse with trivial mitral regurgitation.  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  Both were severe enough that she had to sit down.  She described the sensation as retrosternal tightness.  It lasted for several minutes.  She takes levothyroxine supplements for acquired hypothyroidism.  Recently she's also had issues with hypoglycemia of uncertain etiology.  She has had some preliminary testing of thyroid and adrenal function and insulin levels which did not appear to render a diagnosis.  She is scheduled to see an endocrinologist on January 20.  She had lumbar spine surgery 4-5 years ago and has plans to undergo right total knee replacement in the near future.  Past Medical History:  Diagnosis Date  . Arthritis   . Cancer ([place])    skin on nose  . Cervical disc disease   . Cystocele   . Heart murmur   . History of adenomatous polyp of colon 06/24/15  . History of rheumatic fever    childhood  . Hypothyroidism   . 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 surveillance  . Mitral valve prolapse   . PONV (postoperative nausea and vomiting)   . Primary osteoarthritis of right hip    Intraarticular steroid injection by Dr. Nelva Bush 01/2016  . Rectocele   . SA node dysfunction [facility])     Past Surgical History:  Procedure Laterality Date  . ABDOMINAL HYSTERECTOMY  05/1996   TVH--ovaries remain  . ABDOMINAL SACROCOLPOPEXY N/A 06/13/2017   Procedure: ABDOMINO SACROCOLPOPEXY with Halbans culdoplasty;  Surgeon:  Nunzio Cobbs, MD;  Location: [facility];  Service: Gynecology;  Laterality: N/A;  . ANTERIOR AND POSTERIOR REPAIR N/A 06/13/2017   Procedure: ANTERIOR (CYSTOCELE) AND POSTERIOR REPAIR (RECTOCELE);  Surgeon: Nunzio Cobbs, MD;  Location: [facility];  Service: Gynecology;  Laterality: N/A;  . arthroscopic knee Right   . BACK SURGERY  2012   disc rupture  . BILATERAL SALPINGECTOMY Bilateral 06/13/2017   Procedure: BILATERAL SALPINGECTOMY;  Surgeon: Nunzio Cobbs, MD;  Location: [facility];  Service: Gynecology;   Laterality: Bilateral;  . BLADDER SUSPENSION N/A 06/13/2017   Procedure: TRANSVAGINAL TAPE (TVT) PROCEDURE;  Surgeon: Nunzio Cobbs, MD;  Location: [facility];  Service: Gynecology;  Laterality: N/A;  . COLONOSCOPY W/ POLYPECTOMY  06/24/15   Recall 5 yrs ([facility])  . CYSTOSCOPY N/A 06/13/2017   Procedure: CYSTOSCOPY;  Surgeon: Nunzio Cobbs, MD;  Location: [facility];  Service: Gynecology;  Laterality: N/A;  . KNEE RECONSTRUCTION Right 2008  . LIVER BIOPSY  1997   fatty tumors    Current Medications: Current Meds  Medication Sig  . calcium gluconate 500 MG tablet Take 1 tablet by mouth daily.   Marland Kitchen conjugated estrogens (PREMARIN) vaginal cream USE 1/2 G VAGINALLY 2 TIMES PER WEEK  . escitalopram (LEXAPRO) 10 MG tablet Take 1 tablet (10 mg total) by mouth daily. Needs appointment for further refills  . fluticasone (FLONASE) 50 MCG/ACT nasal spray Place 2 sprays into both nostrils daily.  Marland Kitchen levothyroxine (LEVOTHROID) 25 MCG tablet Take 1 tablet (25 mcg total) by mouth daily before breakfast.  . meloxicam (MOBIC) 15 MG tablet Take 1 tablet (15 mg total) by mouth daily.  . [DISCONTINUED] glucose 4 GM chewable tablet Chew 1 tablet (4 g total) by mouth as needed for low blood sugar.     Allergies:   Patient has no known allergies.   Social History   Socioeconomic History  . Marital status: Married    Spouse name: [name]  . Number of children: 3  . Years of education: Not on file  . Highest education level: Not on file  Occupational History  . Occupation: Product manager: [employer]  Social Needs  . Financial resource strain: Not on file  . Food insecurity:    Worry: Not on file    Inability: Not on file  . Transportation needs:    Medical: Not on file    Non-medical: Not on file  Tobacco Use  . Smoking status: Never Smoker  . Smokeless tobacco: Never Used  Substance and Sexual Activity  . Alcohol use: Yes     Alcohol/week: 4.0 standard drinks    Types: 4 Glasses of wine per week  . Drug use: No  . Sexual activity: Not Currently    Birth control/protection: Other-see comments, Surgical    Comment: Hyst  Lifestyle  . Physical activity:    Days per week: Not on file    Minutes per session: Not on file  . Stress: Not on file  Relationships  . Social connections:    Talks on phone: Not on file    Gets together: Not on file    Attends religious service: Not on file    Active member of club or organization: Not on file    Attends meetings of clubs or organizations: Not on file    Relationship status: Not on file  Other Topics Concern  . Not  on file  Social History Narrative   Ms. Hoffmeier lives in [place] with her husband.  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 She is from [place].     Family History: The patient's family history includes Alcohol abuse in her mother and sister; Arthritis in her father; CVA in her mother; Cancer in her father; Cancer (age of onset: 24) in her cousin and maternal aunt; Heart disease in her brother and mother; Heart murmur in her son, son, and son.  ROS:   Please see the history of present illness.     All other systems reviewed and are negative.  EKGs/Labs/Other Studies Reviewed:    The following studies were reviewed today: Echocardiogram, nuclear stress test, treadmill stress test tracings  EKG:  EKG is not ordered today.  The ekg ordered in our office in the past demonstrates sinus bradycardia/normal sinus rhythm, normal tracing  Recent Labs: 11/12/2018: ALT 15; Hemoglobin 13.0; Platelets 276; TSH 3.13 11/15/2018: BUN 16; Creatinine, Ser 0.68; Potassium 4.3; Sodium 141  Recent Lipid Panel    Component Value Date/Time   CHOL 181 08/16/2017 0908   TRIG 122.0 08/16/2017 0908   TRIG 103 12/17/2012   HDL 72.10 08/16/2017 0908   CHOLHDL 3 08/16/2017 0908   VLDL 24.4 08/16/2017  0908   LDLCALC 85 08/16/2017 0908   LDLDIRECT 113 12/17/2012    Physical Exam:    VS:  BP (!) 100/56 (BP Location: Left Arm, Patient Position: Sitting, Cuff Size: Large)   Pulse 62   Ht 5\' 8"  (1.727 m)   Wt 215 lb (97.5 kg)   LMP 03/28/1996 (Within Months)   BMI 32.69 kg/m     Wt Readings from Last 3 Encounters:  11/30/18 215 lb (97.5 kg)  11/23/18 215 lb (97.5 kg)  11/20/18 215 lb 12.8 oz (97.9 kg)     GEN: Mildly obese, well nourished, well developed in no acute distress HEENT: Normal NECK: No JVD; No carotid bruits LYMPHATICS: No lymphadenopathy CARDIAC: RRR, no murmurs, rubs, gallops RESPIRATORY:  Clear to auscultation without rales, wheezing or rhonchi  ABDOMEN: Soft, non-tender, non-distended MUSCULOSKELETAL:  No edema; No deformity  SKIN: Warm and dry NEUROLOGIC:  Alert and oriented x 3 PSYCHIATRIC:  Normal affect   ASSESSMENT:    1. Precordial chest pain   2. Family history of aortic aneurysm   3. History of sinus bradycardia   4. Hypoglycemia   5. Pre-procedure lab exam    PLAN:    In order of problems listed above:  1. Chest pain: Symptoms were atypical and remain unexplained, but there is no evidence of coronary insufficiency.  She really does not have any risk factors for coronary artery disease. 2. FHx of aortic aneurysm: Numerous family members have had thoracic aneurysms, some of them fatal at young ages.  Reviewed the echo and I do not think we can see her ascending aorta well enough.  Requested a CT angiogram of the aorta. 3. Asymptomatic sinus bradycardia: Avoid use of agents with negative chronotropic effect. 4. Hypoglycemia: Not well explained, endocrinology work-up in progress.   Medication Adjustments/Labs and Tests Ordered: Current medicines are reviewed at length with the patient today.  Concerns regarding medicines are outlined above.  Orders Placed This Encounter  Procedures  . CT ANGIO CHEST AORTA W &/OR WO CONTRAST  . Basic metabolic  panel   No orders of the defined types were placed in this encounter.  Patient Instructions  Medication Instructions:  Dr Sallyanne Kuster recommends that you continue on your current medications as directed. Please refer to the Current Medication list given to you today.  If you need a refill on your cardiac medications before your next appointment, please call your pharmacy.   Lab work: Your physician recommends that you return for lab work prior to your CT.  If you have labs (blood work) drawn today and your tests are completely normal, you will receive your results only by: Marland Kitchen MyChart Message (if you have MyChart) OR . A paper copy in the mail If you have any lab test that is abnormal or we need to change your treatment, we will call you to review the results.  Testing/Procedures: CT Chest AORTA - Non-Cardiac CT Angiography (CTA), is a special type of CT scan that uses a computer to produce multi-dimensional views of major blood vessels throughout the body. In CT angiography, a contrast material is injected through an IV to help visualize the blood vessels  >> This will be performed at our [facility] location [place], [place] [address] [phone]  Follow-Up: At [facility], you and your health needs are our priority.  As part of our continuing mission to provide you with exceptional heart care, we have created designated Provider Care Teams.  These Care Teams include your primary Cardiologist (physician) and Advanced Practice Providers (APPs -  Physician Assistants and Nurse Practitioners) who all work together to provide you with the care you need, when you need it. You will need a follow up appointment in 12 months.  You may see Sanda Klein, MD or one of the following Advanced Practice Providers on your designated Care Team: [place], [place] . Fabian Sharp, PA-C . You will receive a reminder letter in the mail two months in advance. If you don't receive a  letter, please call our office to schedule the follow-up appointment.    Signed, Sanda Klein, MD  12/01/2018 10:09 AM    [facility]

## 2018-12-07 ENCOUNTER — Other Ambulatory Visit: Payer: Self-pay

## 2018-12-07 DIAGNOSIS — R232 Flushing: Secondary | ICD-10-CM

## 2018-12-07 MED ORDER — ESCITALOPRAM OXALATE 10 MG PO TABS: 10.0000 mg | ORAL_TABLET | Freq: Every day | ORAL | 0 refills | Status: DC

## 2018-12-07 NOTE — Telephone Encounter (Signed)
Pt pharmacy requesting rf for pt Escitalopram. Will rf #30 w no additional rfs.

## 2018-12-11 ENCOUNTER — Other Ambulatory Visit: Payer: Self-pay | Admitting: Family Medicine

## 2018-12-11 DIAGNOSIS — Z9109 Other allergy status, other than to drugs and biological substances: Secondary | ICD-10-CM

## 2018-12-11 MED ORDER — FLUTICASONE PROPIONATE 50 MCG/ACT NA SUSP: 2.0000 | Freq: Every day | NASAL | 6 refills | Status: DC

## 2018-12-17 ENCOUNTER — Ambulatory Visit: Payer: BC Managed Care – PPO | Admitting: Internal Medicine

## 2018-12-17 ENCOUNTER — Ambulatory Visit (INDEPENDENT_AMBULATORY_CARE_PROVIDER_SITE_OTHER)
Admission: RE | Admit: 2018-12-17 | Discharge: 2018-12-17 | Disposition: A | Discharge status: Home / Self Care | Payer: BC Managed Care – PPO | Source: Ambulatory Visit | Attending: Cardiovascular Disease | Admitting: Cardiovascular Disease

## 2018-12-17 DIAGNOSIS — R072 Precordial pain: Secondary | ICD-10-CM | POA: Diagnosis not present

## 2018-12-17 MED ORDER — IOPAMIDOL (ISOVUE-370) INJECTION 76%
100.0000 mL | Freq: Once | INTRAVENOUS | Status: AC | PRN
  Administered 2018-12-17: 80 mL via INTRAVENOUS

## 2018-12-20 ENCOUNTER — Encounter: Payer: Self-pay | Admitting: Family Medicine

## 2018-12-20 DIAGNOSIS — C9000 Multiple myeloma not having achieved remission: Secondary | ICD-10-CM

## 2018-12-25 ENCOUNTER — Telehealth: Payer: Self-pay

## 2018-12-25 LAB — BASIC METABOLIC PANEL
BUN/Creatinine Ratio: 23 (ref 12–28)
BUN: 17 mg/dL (ref 8–27)
CALCIUM: 9.7 mg/dL (ref 8.7–10.3)
CHLORIDE: 102 mmol/L (ref 96–106)
CO2: 22 mmol/L (ref 20–29)
Creatinine, Ser: 0.73 mg/dL (ref 0.57–1.00)
GFR calc Af Amer: 102 mL/min/{1.73_m2} (ref >59)
GFR calc non Af Amer: 89 mL/min/{1.73_m2} (ref >59)
Glucose: 78 mg/dL (ref 65–99)
Potassium: 4.8 mmol/L (ref 3.5–5.2)
Sodium: 143 mmol/L (ref 134–144)

## 2018-12-25 NOTE — Telephone Encounter (Signed)
Called patient left message to return my call this is regarding a order Dr.Croitoru wants you to have done.

## 2018-12-26 ENCOUNTER — Other Ambulatory Visit: Payer: Self-pay

## 2018-12-26 DIAGNOSIS — E559 Vitamin D deficiency, unspecified: Secondary | ICD-10-CM

## 2018-12-26 DIAGNOSIS — E039 Hypothyroidism, unspecified: Secondary | ICD-10-CM

## 2018-12-26 DIAGNOSIS — I341 Nonrheumatic mitral (valve) prolapse: Secondary | ICD-10-CM

## 2018-12-26 DIAGNOSIS — R232 Flushing: Secondary | ICD-10-CM

## 2018-12-26 DIAGNOSIS — R9389 Abnormal findings on diagnostic imaging of other specified body structures: Secondary | ICD-10-CM

## 2018-12-26 NOTE — Telephone Encounter (Signed)
Malachy Mood responded to this message via [place]. Per review, has not been read by patient

## 2018-12-27 ENCOUNTER — Telehealth: Payer: Self-pay

## 2018-12-27 NOTE — Telephone Encounter (Signed)
Left message on patient's personal voice mail advised when you bring urine collection back to office Mon 2/3 come with a full bladder for the Upep.Lab tech will draw blood for Spep you do not have to be fasting.

## 2018-12-30 ENCOUNTER — Other Ambulatory Visit: Payer: Self-pay | Admitting: Family Medicine

## 2018-12-30 DIAGNOSIS — R232 Flushing: Secondary | ICD-10-CM

## 2019-01-01 LAB — PROTEIN ELECTROPHORESIS, SERUM
A/G Ratio: 1.5 (ref 0.7–1.7)
A/G Ratio: 1.5 (ref 0.7–1.7)
ALBUMIN ELP: 3.7 g/dL (ref 2.9–4.4)
ALPHA 2: 0.6 g/dL (ref 0.4–1.0)
Albumin ELP: 3.7 g/dL (ref 2.9–4.4)
Alpha 1: 0.2 g/dL (ref 0.0–0.4)
Alpha 1: 0.2 g/dL (ref 0.0–0.4)
Alpha 2: 0.7 g/dL (ref 0.4–1.0)
Beta: 0.9 g/dL (ref 0.7–1.3)
Beta: 0.9 g/dL (ref 0.7–1.3)
Gamma Globulin: 0.6 g/dL (ref 0.4–1.8)
Gamma Globulin: 0.7 g/dL (ref 0.4–1.8)
Globulin, Total: 2.4 g/dL (ref 2.2–3.9)
Globulin, Total: 2.5 g/dL (ref 2.2–3.9)
Total Protein: 6.1 g/dL (ref 6.0–8.5)
Total Protein: 6.2 g/dL (ref 6.0–8.5)

## 2019-01-01 LAB — PROTEIN ELECTROPHORESIS, URINE REFLEX
Albumin ELP, Urine: 31.2 %
Alpha-1-Globulin, U: 3.2 %
Alpha-2-Globulin, U: 27.2 %
Beta Globulin, U: 28.7 %
Gamma Globulin, U: 9.7 %
PROTEIN UR: 22.9 mg/dL

## 2019-01-01 LAB — UIFE/LIGHT CHAINS/TP QN, 24-HR UR
FR KAPPA LT CH,24HR: 14.13 mg/24 hr
FR LAMBDA LT CH,24HR: 3.17 mg/24 hr
FREE LAMBDA LT CHAINS, UR: 2.11 mg/L (ref 0.47–11.77)
Free Kappa Lt Chains,Ur: 9.42 mg/L (ref 0.63–113.79)
Kappa/Lambda Ratio,U: 4.46 (ref 1.03–31.76)
Protein, 24H Urine: 129 mg/24 hr (ref 30–150)
Protein, Ur: 8.6 mg/dL

## 2019-01-03 ENCOUNTER — Encounter: Payer: Self-pay | Admitting: Internal Medicine

## 2019-01-03 ENCOUNTER — Ambulatory Visit: Payer: BC Managed Care – PPO | Admitting: Internal Medicine

## 2019-01-03 VITALS — BP 116/62 | HR 56 | Ht 68.0 in | Wt 216.0 lb

## 2019-01-03 DIAGNOSIS — R61 Generalized hyperhidrosis: Secondary | ICD-10-CM

## 2019-01-03 DIAGNOSIS — E162 Hypoglycemia, unspecified: Secondary | ICD-10-CM | POA: Insufficient documentation

## 2019-01-03 NOTE — Patient Instructions (Signed)
-   Please obtain (ReliOn glucose meter) from [facility] , please obtain ~ 20 strips.  - Check your sugars every morning (before ou eat ) and then every time you are feeling sweaty , shaky, or out of the ordinary tired

## 2019-01-03 NOTE — Progress Notes (Signed)
Name: Cynthia [name]  MRN/ DOB: [id number], Dec 15, 1955    Age/ Sex: 63 y.o., female    PCP: Ma Hillock, DO   Reason for Endocrinology Evaluation: Hypoglycemia     Date of Initial Endocrinology Evaluation: 01/03/2019     HPI: Cynthia [name] is a 63 y.o. female with a past medical history of [name], Cynthia [name] The patient presented for initial endocrinology clinic visit on 01/03/2019 for consultative assistance with her hypoglycemia and diaphoresis.   Pt is here with c/o fatigue and diaphoresis that has been on going for a couple of years, her night sweats are only at nigh, mostly she feels mildly clammy at night but once or twice a month,she is drenched with sweat to where she has to change her clothes.She does notice some hot flashes during the day associated with flushing and upper chest sweating and forehead. She struggles with her weight , unable to lose weight.  She does have insomnia, for the past few years.  She does get spells of feeling hungry , clammy, sweaty and shaky if she misses a meal, which improves with eating, used to happen more during childhood. These episodes happen 4-5 x a year.  She denies any loss of consciousness.    She denies shaking and jittery sensation, she wakes up sweaty in the morning but feel rested. She does not snore.   She denies diarrhea, but has chronic constipation, denies any abdominal pain or nausea.  Denies swelling of hands and feet, no change in ring size, nor shoe size, rare  headaches no blurry vision.   She is up to date on mammograms and pap smears, she is due for a colonoscopy this summer.      HISTORY:  Past Medical History:  Past Medical History:  Diagnosis Date  . Arthritis   . Cancer ([place])    skin on nose  . Cervical disc disease   . Cystocele   . Heart murmur   . History of adenomatous polyp of colon 06/24/15  . History of rheumatic fever    childhood  . [name]   . Liver  tumor (benign)    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 surveillance  . Cynthia valve prolapse   . PONV (postoperative nausea and vomiting)   . Primary osteoarthritis of right hip    Intraarticular steroid injection by Dr. Nelva Bush 01/2016  . Rectocele   . SA node dysfunction [facility])    Past Surgical History:  Past Surgical History:  Procedure Laterality Date  . ABDOMINAL HYSTERECTOMY  05/1996   TVH--ovaries remain  . ABDOMINAL SACROCOLPOPEXY N/A 06/13/2017   Procedure: ABDOMINO SACROCOLPOPEXY with Halbans culdoplasty;  Surgeon: Nunzio Cobbs, MD;  Location: [facility];  Service: Gynecology;  Laterality: N/A;  . ANTERIOR AND POSTERIOR REPAIR N/A 06/13/2017   Procedure: ANTERIOR (CYSTOCELE) AND POSTERIOR REPAIR (RECTOCELE);  Surgeon: Nunzio Cobbs, MD;  Location: [facility];  Service: Gynecology;  Laterality: N/A;  . arthroscopic knee Right   . BACK SURGERY  2012   disc rupture  . BILATERAL SALPINGECTOMY Bilateral 06/13/2017   Procedure: BILATERAL SALPINGECTOMY;  Surgeon: Nunzio Cobbs, MD;  Location: [facility];  Service: Gynecology;  Laterality: Bilateral;  . BLADDER SUSPENSION N/A 06/13/2017   Procedure: TRANSVAGINAL TAPE (TVT) PROCEDURE;  Surgeon: Nunzio Cobbs, MD;  Location: [facility];  Service: Gynecology;  Laterality: N/A;  . COLONOSCOPY W/ POLYPECTOMY  06/24/15  Recall 5 yrs ([facility])  . CYSTOSCOPY N/A 06/13/2017   Procedure: CYSTOSCOPY;  Surgeon: Nunzio Cobbs, MD;  Location: [facility];  Service: Gynecology;  Laterality: N/A;  . KNEE RECONSTRUCTION Right 2008  . LIVER BIOPSY  1997   fatty tumors      Social History:  reports that she has never smoked. She has never used smokeless tobacco. She reports current alcohol use of about 4.0 standard drinks of alcohol per week. She reports that she does not use drugs.  Family History: family history includes Alcohol abuse in her mother and sister; Arthritis in her  father; CVA in her mother; Cancer in her father; Cancer (age of onset: 90) in her cousin and maternal aunt; Heart disease in her brother and mother; Heart murmur in her son, son, and son.   HOME MEDICATIONS: Allergies as of 01/03/2019   No Known Allergies     Medication List       Accurate as of January 03, 2019  8:17 AM. Always use your most recent med list.        calcium gluconate 500 MG tablet Take 1 tablet by mouth daily.   conjugated estrogens vaginal cream Commonly known as:  PREMARIN USE 1/2 G VAGINALLY 2 TIMES PER WEEK   escitalopram 10 MG tablet Commonly known as:  LEXAPRO TAKE 1 TABLET (10 MG TOTAL) BY MOUTH DAILY. NEEDS APPOINTMENT FOR FURTHER REFILLS   levothyroxine 25 MCG tablet Commonly known as:  LEVOTHROID Take 1 tablet (25 mcg total) by mouth daily before breakfast.   meloxicam 15 MG tablet Commonly known as:  MOBIC Take 1 tablet (15 mg total) by mouth daily.         REVIEW OF SYSTEMS: A comprehensive ROS was conducted with the patient and is negative except as per HPI and below:  Review of Systems  Constitutional: Positive for malaise/fatigue. Negative for fever and weight loss.  HENT: Negative for congestion and sore throat.   Eyes: Negative for blurred vision and photophobia.  Respiratory: Negative for cough and shortness of breath.   Cardiovascular: Positive for chest pain. Negative for palpitations.  Gastrointestinal: Positive for constipation. Negative for abdominal pain and nausea.  Genitourinary: Negative for frequency.  Musculoskeletal: Positive for joint pain.  Skin: Negative for itching and rash.  Neurological: Negative for tingling, tremors and headaches.  Endo/Heme/Allergies: Negative for polydipsia.  Psychiatric/Behavioral: Negative for depression. The patient is not nervous/anxious.        OBJECTIVE:  VS: BP 116/62 (BP Location: Right Arm, Patient Position: Sitting, Cuff Size: Large)   Pulse (!) 56   Ht 5\' 8"  (1.727 m)   Wt  216 lb (98 kg)   LMP 03/28/1996 (Within Months)   SpO2 94%   BMI 32.84 kg/m    Wt Readings from Last 3 Encounters:  01/03/19 216 lb (98 kg)  11/30/18 215 lb (97.5 kg)  11/23/18 215 lb (97.5 kg)     EXAM: General: Pt appears well and is in NAD  Hydration: Well-hydrated with moist mucous membranes and good skin turgor  Eyes: External eye exam normal without stare, lid lag or exophthalmos.  EOM intact.  PERRL.  Ears, Nose, Throat: Hearing: Grossly intact bilaterally Dental: Good dentition  Throat: Clear without mass, erythema or exudate, no buccal mucosa hyperpigmentation   Neck: General: Supple without adenopathy. Thyroid: Thyroid size normal.  No goiter or nodules appreciated. No thyroid bruit.  Lungs: Clear with good BS bilat with no rales, rhonchi, or  wheezes  Heart: Auscultation: RRR.  Abdomen: Normoactive bowel sounds, soft, nontender, without masses or organomegaly palpable  Extremities:  BL LE: No pretibial edema normal ROM and strength.  Skin: Hair: Texture and amount normal with gender appropriate distribution Skin Inspection: No rashes, no hyperpigmentation.  Skin Palpation: Skin temperature, texture, and thickness normal to palpation  Neuro: Cranial nerves: II - XII grossly intact  Motor: Normal strength throughout DTRs: 2+ and symmetric in UE without delay in relaxation phase  Mental Status: Judgment, insight: Intact Orientation: Oriented to time, place, and person Mood and affect: No depression, anxiety, or agitation     DATA REVIEWED:   Results for GABRILLE, KILBRIDE" (MRN [id number]) as of 01/03/2019 07:35  Ref. Range 11/15/2018 08:02  Cortisol, Plasma Latest Units: ug/dL 9.6  Glucose Latest Ref Range: 70 - 99 mg/dL 103 (H)  INSULIN Latest Ref Range: 2.6 - 24.9 uIU/mL 12.0  Proinsulin Latest Ref Range: < OR = 18. pmol/L 8.2  C-Peptide Latest Ref Range: 1.1 - 4.4 ng/mL 2.9   Results for KENNESHIA, REHM" (MRN [id number]) as of 01/03/2019 07:35   Ref. Range 11/12/2018 16:27  TSH Latest Ref Range: 0.35 - 4.50 uIU/mL 3.13   ASSESSMENT/PLAN/RECOMMENDATIONS:   1. Hypoglycemia:  -Patient did have a serum glucose of 49 mg/dL in December, 2019.  She was asymptomatic at the time.  Differential diagnoses include lab error versus insulinoma there's is no evidence of end organ failure such as kidney disease, CHF or liver failure.  -But given her nonspecific symptoms of fatigue clammy and shaky sensations at times we will pursue further work-up for hypoglycemia -She was advised to obtain a ReliOn glucose meter from [place], her insurance company is not going to pay for glucose meter without a diagnosis of diabetes, she was also instructed to check her fingerstick every morning and anytime that she does not feel well. -Glucose readings in the low 50s or less will require further evaluation such as 72-hour fast testing -At this time there is no clinical evidence for adrenal insufficiency  2. Excessive sweating :  - Patient does not have any clinical evidence of acromegaly or carcinoid syndrome.  - She is clinically and biochemically euthyroid -We will continue monitoring her glucose levels as above, as that is the only endocrine differential that could cause excessive sweating that is clinically relevant in her case.    Time spent with the patient was 45 minutes, over 50% of the time was spent in counseling and education   Follow-up in 6 weeks  Signed electronically by: Mack Guise, MD  [facility]  [facility] [address] [place], [place] [address] Phone: [phone] FAX: [phone]   CC: Ma Hillock, DO [address] [place] [address] Phone: [phone] Fax: [phone]   Return to Endocrinology clinic as below: No future appointments.

## 2019-01-07 ENCOUNTER — Encounter: Payer: Self-pay | Admitting: Family Medicine

## 2019-02-14 ENCOUNTER — Other Ambulatory Visit: Payer: Self-pay

## 2019-02-14 ENCOUNTER — Ambulatory Visit: Payer: BC Managed Care – PPO | Admitting: Internal Medicine

## 2019-02-14 ENCOUNTER — Encounter: Payer: Self-pay | Admitting: Internal Medicine

## 2019-02-14 VITALS — BP 118/70 | HR 58 | Temp 97.8°F | Ht 68.0 in | Wt 216.6 lb

## 2019-02-14 DIAGNOSIS — E162 Hypoglycemia, unspecified: Secondary | ICD-10-CM

## 2019-02-14 DIAGNOSIS — Z8639 Personal history of other endocrine, nutritional and metabolic disease: Secondary | ICD-10-CM

## 2019-02-14 NOTE — Patient Instructions (Signed)
-   Please continue to check your sugar when you start feeling hungry, clammy, or shaky.  - Please call our office to schedule an appointment if your sugar readings are below 60 mg/dL.

## 2019-02-14 NOTE — Progress Notes (Signed)
Name: Cynthia [name]  MRN/ DOB: [id number], [birth year]    Age/ Sex: 63 y.o., female     PCP: Ma Hillock, DO   Reason for Endocrinology Evaluation: Hypoglycemia      Initial Endocrinology Clinic Visit: 01/03/2019    PATIENT IDENTIFIER: Cynthia [name] is a 63 y.o., female with a past medical history of hypothyroidism, mitral valve prolapse and OA . She has followed with [facility] since 01/03/2019 for consultative assistance with management of her hypoglycemia.   HISTORICAL SUMMARY:  She presented to her PCP fatigue and diaphoresis that has been on going for a couple of years, her night sweats are only at nigh, mostly she feels mildly clammy at night but once or twice a month,she is drenched with sweat to where she has to change her clothes.She does notice some hot flashes during the day associated with flushing and upper chest sweating and forehead. She struggles with her weight , unable to lose weight.  She does have insomnia, for the past few years.  She does get spells of feeling hungry , clammy, sweaty and shaky if she misses a meal, which improves with eating, used to happen more during childhood. These episodes happen 4-5 x a year.  She has chronic diarrhea but no diarrhea.   SUBJECTIVE:   During last visit (01/03/2019): She was advised to obtain a glucose meter and check glucose fasting and when symptomatic.   Today (02/14/2019):  Cynthia [name] is here for 6 week follow up on hypoglycemia. She feels well overall. She has 2 episodes in the past 6 weeks where she felt clammy, shaky and hungry but no hypoglycemia on glucose checks.  She continued to check glucose during fasting as well regardless of what her symptoms were.  She denies any other complaints.    ROS:  As per HPI.   HISTORY:  Past Medical History:  Past Medical History:  Diagnosis Date  . Arthritis   . Cancer ([place])    skin on nose  . Cervical disc disease   . Cystocele   . Heart  murmur   . History of adenomatous polyp of colon 06/24/15  . History of rheumatic fever    childhood  . Hypothyroidism   . 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 surveillance  . Mitral valve prolapse   . PONV (postoperative nausea and vomiting)   . Primary osteoarthritis of right hip    Intraarticular steroid injection by Dr. Nelva Bush 01/2016  . Rectocele   . SA node dysfunction [facility])     Past Surgical History:  Past Surgical History:  Procedure Laterality Date  . ABDOMINAL HYSTERECTOMY  05/1996   TVH--ovaries remain  . ABDOMINAL SACROCOLPOPEXY N/A 06/13/2017   Procedure: ABDOMINO SACROCOLPOPEXY with Halbans culdoplasty;  Surgeon: Nunzio Cobbs, MD;  Location: [facility];  Service: Gynecology;  Laterality: N/A;  . ANTERIOR AND POSTERIOR REPAIR N/A 06/13/2017   Procedure: ANTERIOR (CYSTOCELE) AND POSTERIOR REPAIR (RECTOCELE);  Surgeon: Nunzio Cobbs, MD;  Location: [facility];  Service: Gynecology;  Laterality: N/A;  . arthroscopic knee Right   . BACK SURGERY  2012   disc rupture  . BILATERAL SALPINGECTOMY Bilateral 06/13/2017   Procedure: BILATERAL SALPINGECTOMY;  Surgeon: Nunzio Cobbs, MD;  Location: [facility];  Service: Gynecology;  Laterality: Bilateral;  . BLADDER SUSPENSION N/A 06/13/2017   Procedure: TRANSVAGINAL TAPE (TVT) PROCEDURE;  Surgeon: Yisroel Ramming, Dietrich Pates  E, MD;  Location: [facility];  Service: Gynecology;  Laterality: N/A;  . COLONOSCOPY W/ POLYPECTOMY  06/24/15   Recall 5 yrs ([facility])  . CYSTOSCOPY N/A 06/13/2017   Procedure: CYSTOSCOPY;  Surgeon: Nunzio Cobbs, MD;  Location: [facility];  Service: Gynecology;  Laterality: N/A;  . KNEE RECONSTRUCTION Right 2008  . LIVER BIOPSY  1997   fatty tumors     Social History:  reports that she has never smoked. She has never used smokeless tobacco. She reports current alcohol use of about 4.0 standard drinks of alcohol per week. She reports  that she does not use drugs. Family History:  Family History  Problem Relation Age of Onset  . Alcohol abuse Mother   . CVA Mother        Dec 65  . Heart disease Mother   . Cancer Father        prostate  . Arthritis Father   . Heart murmur Son   . Alcohol abuse Sister   . Heart disease Brother        congenital heart defect--Dec age 16 from MI  . Heart murmur Son   . Heart murmur Son   . Cancer Maternal Aunt 50       Dec colon CA age 61  . Cancer Cousin 50       Dec colon CA age 24      HOME MEDICATIONS: Allergies as of 02/14/2019   No Known Allergies     Medication List       Accurate as of February 14, 2019  7:30 AM. Always use your most recent med list.        calcium gluconate 500 MG tablet Take 1 tablet by mouth daily.   conjugated estrogens vaginal cream Commonly known as:  Premarin USE 1/2 G VAGINALLY 2 TIMES PER WEEK   escitalopram 10 MG tablet Commonly known as:  LEXAPRO TAKE 1 TABLET (10 MG TOTAL) BY MOUTH DAILY. NEEDS APPOINTMENT FOR FURTHER REFILLS   levothyroxine 25 MCG tablet Commonly known as:  Levothroid Take 1 tablet (25 mcg total) by mouth daily before breakfast.   meloxicam 15 MG tablet Commonly known as:  MOBIC Take 1 tablet (15 mg total) by mouth daily.         OBJECTIVE:   PHYSICAL EXAM: VS: Ht 5\' 8"  (1.727 m)   LMP 03/28/1996 (Within Months)   BMI 32.84 kg/m    EXAM: General: Pt appears well and is in NAD  Lungs: Clear with good BS bilat with no rales, rhonchi, or wheezes  Heart: Auscultation: RRR.  Abdomen: Normoactive bowel sounds, soft, nontender, without masses or organomegaly palpable  Extremities:  BL LE: No pretibial edema normal ROM and strength.  Skin: Hair: Texture and amount normal with gender appropriate distribution Skin Inspection: No rashes. Skin Palpation: Skin temperature, texture, and thickness normal to palpation  Neuro: Cranial nerves: II - XII grossly intact  Motor: Normal strength throughout  DTRs: 2+ and symmetric in UE without delay in relaxation phase  Mental Status: Judgment, insight: Intact Orientation: Oriented to time, place, and person Mood and affect: No depression, anxiety, or agitation     GLUCOSE LOG: BG's range from 83-168 mg/dL     ASSESSMENT / PLAN / RECOMMENDATIONS:   1. Hypoglycemia:   - Pt does NOT have hypoglycemia, in review of her glucose meter, her BG's in the past 6 weeks and during symptoms have been above 80 mg/dL.  -  She does not meet whipple's triad.  - Reassurance provided today  - Pt encouraged to continue glucose checks only when symptomatic from now, pt to contact us should her BG trend below 60 mg/dL.    F/u PRN    Signed electronically by: Mack Guise, MD  [facility]  [facility] [address] [place], [place] [address] Phone: [phone] FAX: [phone]      CC: Rossie Muskrat [address] [place] [address] Phone: [phone]  Fax: [phone]   Return to Endocrinology clinic as below: No future appointments.

## 2019-04-12 ENCOUNTER — Telehealth: Payer: Self-pay | Admitting: Family Medicine

## 2019-04-12 ENCOUNTER — Telehealth: Payer: Self-pay

## 2019-04-12 NOTE — Telephone Encounter (Signed)
Copied from [place] [id number]. Topic: General - Inquiry >> Apr 12, 2019  9:23 AM Mathis Bud wrote: Reason for CRM: Patient got the okay from [place] (Dr.Olin) regarding getting a knee replacement surgery.  Patient states they have to get clearance from PCP.  Patient said she could come in or do virtual anytime if necessary.  Patient call back number [phone]

## 2019-04-12 NOTE — Telephone Encounter (Signed)
Copied from [place] [id number]. Topic: General - Inquiry >> Apr 12, 2019  9:23 AM Mathis Bud wrote: Reason for CRM: Patient got the okay from [place] (Dr.Olin) regarding getting a knee replacement surgery.  Patient states they have to get clearance from PCP.  Patient said she could come in or do virtual anytime if necessary.  Patient call back number [phone]

## 2019-04-12 NOTE — Telephone Encounter (Signed)
Pt was called and scheduled for virtual visit. Pt did just have EKG performed at cardiology and is going to call and request this be sent over to [facility]. Paperwork that is needed is on my desk for when pt has appt. Pt verbalized understanding on plan.

## 2019-04-15 ENCOUNTER — Ambulatory Visit (INDEPENDENT_AMBULATORY_CARE_PROVIDER_SITE_OTHER): Payer: BC Managed Care – PPO | Admitting: Family Medicine

## 2019-04-15 ENCOUNTER — Encounter: Payer: Self-pay | Admitting: Family Medicine

## 2019-04-15 VITALS — BP 110/64 | Ht 68.0 in | Wt 214.0 lb

## 2019-04-15 DIAGNOSIS — E669 Obesity, unspecified: Secondary | ICD-10-CM

## 2019-04-15 DIAGNOSIS — Z87898 Personal history of other specified conditions: Secondary | ICD-10-CM

## 2019-04-15 DIAGNOSIS — Z01818 Encounter for other preprocedural examination: Secondary | ICD-10-CM

## 2019-04-15 NOTE — Progress Notes (Signed)
VIRTUAL VISIT VIA VIDEO  I connected with Cynthia [name] on 04/15/19 at  3:00 PM EDT by a video enabled telemedicine application and verified that I am speaking with the correct person using two identifiers. Location patient: Home Location provider: [facility], Office Persons participating in the virtual visit: Patient, Dr. Raoul Pitch and R.Baker, LPN  I discussed the limitations of evaluation and management by telemedicine and the availability of in person appointments. The patient expressed understanding and agreed to proceed.   Procedure: Right total knee arthroplasty Anesthesia: Spinal anesthesia  Surgery type risk:   - Intermediate risk= orthopedics  Prior anesthesia complications: No. She does have PONV. Family history of prior anesthesia complications:No  Cardiac:    - Pt has no cardiac chronic conditions.    - METs: She exercises routinely and denies any chest pain, dyspnea, or CHF symptoms.    - EKG: Last EKG 10/2018-mild bradycardia at 58 heart rate with first-degree AV block. Pulmonary: No chronic conditions.  Endocrine: Not a diabetic. Obesity:Body mass index is 32.54 kg/m.  Chronic kidney disease: Normal kidney function.  Chronic med that needs to be continued: Synthroid.  Anticoagulation: Stopped Mobic > 2 weeks prior to surgery.  Labs: CBC, CMP, glucose reviewed 12/24/2018. >> WNL BP 110/64   Ht 5\' 8"  (1.727 m)   Wt 214 lb (97.1 kg)   LMP 03/28/1996 (Within Months)   BMI 32.54 kg/m  A/P:  Cynthia [name] is a 63 y.o. female present for surgical clearance for right total knee arthroplasty to be completed on April 29, 2019. Preoperative clearance Patient is at low risk for surgical complications based on her past medical history.  Recent lab work was reviewed and CBC, CMP and glucose were within normal range of January 2020. Copy of EKG was included from December 2019. -Very mild bradycardia at 58 and first-degree AV block. She has stopped her Mobic > 2  weeks prior to surgery and she is aware to avoid all NSAIDS prior to surgery.   Obesity (BMI 30-39.9) Body mass index is 32.54 kg/m. History of postoperative nausea and vomiting Patient has a history of postoperative nausea and vomiting.    No patient is free of risk when undergoing a procedure. The decision about whether to proceed with the operation belongs to the surgeon and the patient.  > 15 minutes spent with patient, >50% of time spent face to face counseling

## 2019-04-29 HISTORY — PX: REPLACEMENT TOTAL KNEE: SUR1224

## 2019-06-21 ENCOUNTER — Other Ambulatory Visit: Payer: Self-pay | Admitting: Family Medicine

## 2019-06-21 DIAGNOSIS — Z1231 Encounter for screening mammogram for malignant neoplasm of breast: Secondary | ICD-10-CM

## 2019-07-02 ENCOUNTER — Other Ambulatory Visit: Payer: Self-pay | Admitting: *Deleted

## 2019-07-02 NOTE — Telephone Encounter (Signed)
Medication refill request: premarin vag cream  Last AEX:  OV 10/08/18 Dr. Quincy Simmonds  Next AEX: none Last MMG (if hormonal medication request): 05/11/18 BIRADS1:Neg. Has appt 08/07/19 Refill authorized: 06/21/18 #30g/1R. Today please advise.

## 2019-07-03 MED ORDER — PREMARIN 0.625 MG/GM VA CREA: TOPICAL_CREAM | VAGINAL | 0 refills | Status: DC

## 2019-08-07 ENCOUNTER — Ambulatory Visit
Admission: RE | Admit: 2019-08-07 | Discharge: 2019-08-07 | Disposition: A | Discharge status: Home / Self Care | Payer: BC Managed Care – PPO | Source: Ambulatory Visit | Attending: Family Medicine | Admitting: Family Medicine

## 2019-08-07 DIAGNOSIS — Z1231 Encounter for screening mammogram for malignant neoplasm of breast: Secondary | ICD-10-CM

## 2019-09-16 ENCOUNTER — Telehealth: Payer: Self-pay | Admitting: Family Medicine

## 2019-09-16 NOTE — Telephone Encounter (Signed)
Pt was called and told she has not been seen since 10/2018 for [facility] and also reported she was no longer taking at pre op appt. Pt was left detailed message letting her know she needed to call back and schedule appt to be seen.

## 2019-09-16 NOTE — Telephone Encounter (Signed)
Patient request refill    meloxicam (MOBIC) 15 MG tablet [id number]   [facility] [address]

## 2019-09-18 ENCOUNTER — Encounter: Payer: Self-pay | Admitting: Family Medicine

## 2019-09-18 ENCOUNTER — Ambulatory Visit (INDEPENDENT_AMBULATORY_CARE_PROVIDER_SITE_OTHER): Payer: BC Managed Care – PPO | Admitting: Family Medicine

## 2019-09-18 ENCOUNTER — Other Ambulatory Visit: Payer: Self-pay

## 2019-09-18 VITALS — Temp 97.6°F | Ht 68.0 in | Wt 220.0 lb

## 2019-09-18 DIAGNOSIS — M159 Polyosteoarthritis, unspecified: Secondary | ICD-10-CM | POA: Diagnosis not present

## 2019-09-18 MED ORDER — CELECOXIB 100 MG PO CAPS: 100.0000 mg | ORAL_CAPSULE | Freq: Two times a day (BID) | ORAL | 3 refills | Status: DC

## 2019-09-18 NOTE — Progress Notes (Signed)
VIRTUAL VISIT VIA VIDEO  I connected with Cynthia [name] on 09/18/19 at  8:00 AM EDT by a video enabled telemedicine application and verified that I am speaking with the correct person using two identifiers. Location patient: Home Location provider: [facility], Office Persons participating in the virtual visit: Patient, Dr. Raoul Pitch and R.Baker, LPN  I discussed the limitations of evaluation and management by telemedicine and the availability of in person appointments. The patient expressed understanding and agreed to proceed.   SUBJECTIVE Chief Complaint  Patient presents with  . Medication Refill    Needs meloxicam refilled but would like higher dose. Not helping with pain.     HPI: Cynthia [name] is a 63 y.o. female present for arthritis pain.  Patient reports her knee pain is much improved since her knee surgery.  She does endorse continued and worsening bilateral hip pain.  She reports compliance with Mobic 15 mg daily, but does not feel it is as effective as of lately for her hip pain.  She reports she was on Celebrex briefly prior to her knee surgery and had great coverage with the Celebrex.  ROS: See pertinent positives and negatives per HPI.  Patient Active Problem List   Diagnosis Date Noted  . Preoperative clearance 04/15/2019  . Hypoglycemia 01/03/2019  . Excessive sweating 11/13/2018  . Hypothyroidism   . MVP (mitral valve prolapse) 03/14/2017  . Family history of aortic aneurysm 03/14/2017  . Osteoarthritis 05/25/2016  . Obesity (BMI 30-39.9) 05/25/2016  . Vitamin D deficiency 05/12/2015  . History of colon polyps 05/27/2014  . Vasomotor flushing 03/17/2014  . Personal history of cardiac arrhythmia 03/17/2014    Social History   Tobacco Use  . Smoking status: Never Smoker  . Smokeless tobacco: Never Used  Substance Use Topics  . Alcohol use: Yes    Alcohol/week: 4.0 standard drinks    Types: 4 Glasses of wine per week    Current  Outpatient Medications:  .  calcium gluconate 500 MG tablet, Take 1 tablet by mouth daily. , Disp: , Rfl:  .  conjugated estrogens (PREMARIN) vaginal cream, USE 1/2 G VAGINALLY 2 TIMES PER WEEK, Disp: 30 g, Rfl: 0 .  escitalopram (LEXAPRO) 10 MG tablet, TAKE 1 TABLET (10 MG TOTAL) BY MOUTH DAILY. NEEDS APPOINTMENT FOR FURTHER REFILLS, Disp: 90 tablet, Rfl: 3 .  levothyroxine (LEVOTHROID) 25 MCG tablet, Take 1 tablet (25 mcg total) by mouth daily before breakfast., Disp: 90 tablet, Rfl: 3 .  meloxicam (MOBIC) 15 MG tablet, Take 1 tablet (15 mg total) by mouth daily., Disp: 90 tablet, Rfl: 3  No Known Allergies  OBJECTIVE: Temp 97.6 F (36.4 C) (Oral)   Ht 5\' 8"  (1.727 m)   Wt 220 lb (99.8 kg)   LMP 03/28/1996 (Within Months)   BMI 33.45 kg/m  Gen: No acute distress. Nontoxic in appearance.  HENT: AT. [facility].  MMM.  Eyes:Pupils Equal Round Reactive to light, Extraocular movements intact,  Conjunctiva without redness, discharge or icterus. Neuro:  Normal gait. Alert. Oriented x3  Psych: Normal affect, dress and demeanor. Normal speech. Normal thought content and judgment.  ASSESSMENT AND PLAN: Cynthia [name] is a 63 y.o. female present for  Osteoarthritis of multiple joints, unspecified osteoarthritis type -Bilateral hip discomfort is worsening.  Discussed options with her today and agreed to discontinue Mobic and start Celebrex 200 mg total daily dose-May be  divided if desired.  Patient was encouraged to take this with food.  And monitor for any stomach upset if stomach upset occurs consider starting over-the-counter Prilosec.  Patient reports understanding and is agreeable with plan.  F/u: CPE mid December  > 15 minutes spent with patient, > 50% of that time face to face   Howard Pouch, DO 09/18/2019

## 2019-10-02 ENCOUNTER — Other Ambulatory Visit: Payer: Self-pay | Admitting: Obstetrics and Gynecology

## 2019-10-02 NOTE — Telephone Encounter (Signed)
Please schedule an appointment with me for her annual exam.

## 2019-10-02 NOTE — Telephone Encounter (Signed)
Medication refill request: Premarin  Last OV:  10-08-18 BS  Next AEX: none scheduled  Last MMG (if hormonal medication request): 08-07-2019 density B/BIRADS 1 negative  Refill authorized: Today, please advise.   Medication pended for #30g, 0RF. Please refill if appropriate.

## 2019-10-03 NOTE — Telephone Encounter (Signed)
Left message to call Colletta Maryland, RN at [facility].

## 2019-10-03 NOTE — Telephone Encounter (Signed)
Spoke with pt. Pt given Rx information on Premarin and to pick up at pharmacy. Pt agreeable Scheduled  AEX appt 12/16/19 at 1:30pm.   Patient is agreeable to disposition.  Will route to Dr Quincy Simmonds for any additional recommendations.

## 2019-12-11 ENCOUNTER — Telehealth (INDEPENDENT_AMBULATORY_CARE_PROVIDER_SITE_OTHER): Payer: BC Managed Care – PPO | Admitting: Cardiovascular Disease

## 2019-12-11 ENCOUNTER — Telehealth: Payer: Self-pay | Admitting: *Deleted

## 2019-12-11 ENCOUNTER — Encounter: Payer: Self-pay | Admitting: Cardiovascular Disease

## 2019-12-11 DIAGNOSIS — Z8249 Family history of ischemic heart disease and other diseases of the circulatory system: Secondary | ICD-10-CM

## 2019-12-11 NOTE — Telephone Encounter (Signed)

## 2019-12-11 NOTE — Progress Notes (Signed)
Virtual Visit via Video Note   This visit type was conducted due to national recommendations for restrictions regarding the COVID-19 Pandemic (e.g. social distancing) in an effort to limit this patient's exposure and mitigate transmission in our community.  Due to her co-morbid illnesses, this patient is at least at moderate risk for complications without adequate follow up.  This format is felt to be most appropriate for this patient at this time.  All issues noted in this document were discussed and addressed.  A limited physical exam was performed with this format.  Please refer to the patient's chart for her consent to telehealth for [facility].   Date:  12/11/2019   ID:  Cynthia [name], DOB 11/10/1956, MRN [id number]  Patient Location: Other:  school Provider Location: Home  PCP:  Ma Hillock, DO  Cardiologist:  Sanda Klein, MD  Electrophysiologist:  None   Evaluation Performed:  Follow-Up Visit  Chief Complaint: Family history of thoracic aneurysm, bradycardia  History of Present Illness:    Cynthia [name] is a 64 y.o. female with bradycardia, first-degree AV block, a strong family history of thoracic aortic aneurysm who was having some problems with chest discomfort a year ago.  CT angiogram of the aorta showed normal caliber aorta from the chest to the pelvis.  Her chest symptoms have resolved.  She has no vascular complaints.  She does have some issues with joint disease well controlled with Celebrex.  She has a scheduled appointment with her PCP in the near future to follow-up on labs since she is chronically on NSAIDs.  She has a history of bradycardia and first-degree AV block, but has not had issues with dizziness, weakness, fatigue, syncope or palpitations.  She remains mildly obese.  She is still working as a Pharmacist, hospital at [employer].  The patient specifically denies any chest pain at rest or with exertion, dyspnea at rest or with exertion,  orthopnea, paroxysmal nocturnal dyspnea, syncope, palpitations, focal neurological deficits, intermittent claudication, lower extremity edema, unexplained weight gain, cough, hemoptysis or wheezing.   The patient does not have symptoms concerning for COVID-19 infection (fever, chills, cough, or new shortness of breath).    Past Medical History:  Diagnosis Date  . Arthritis   . Cancer ([place])    skin on nose  . Cervical disc disease   . Cystocele   . Heart murmur   . History of adenomatous polyp of colon 06/24/15  . History of rheumatic fever    childhood  . Hypothyroidism   . 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 surveillance  . Mitral valve prolapse   . PONV (postoperative nausea and vomiting)   . Primary osteoarthritis of right hip    Intraarticular steroid injection by Dr. Nelva Bush 01/2016  . Rectocele   . SA node dysfunction [facility])    Past Surgical History:  Procedure Laterality Date  . ABDOMINAL HYSTERECTOMY  05/1996   TVH--ovaries remain  . ABDOMINAL SACROCOLPOPEXY N/A 06/13/2017   Procedure: ABDOMINO SACROCOLPOPEXY with Halbans culdoplasty;  Surgeon: Nunzio Cobbs, MD;  Location: [facility];  Service: Gynecology;  Laterality: N/A;  . ANTERIOR AND POSTERIOR REPAIR N/A 06/13/2017   Procedure: ANTERIOR (CYSTOCELE) AND POSTERIOR REPAIR (RECTOCELE);  Surgeon: Nunzio Cobbs, MD;  Location: [facility];  Service: Gynecology;  Laterality: N/A;  . arthroscopic knee Right   . BACK SURGERY  2012   disc rupture  . BILATERAL  SALPINGECTOMY Bilateral 06/13/2017   Procedure: BILATERAL SALPINGECTOMY;  Surgeon: Nunzio Cobbs, MD;  Location: [facility];  Service: Gynecology;  Laterality: Bilateral;  . BLADDER SUSPENSION N/A 06/13/2017   Procedure: TRANSVAGINAL TAPE (TVT) PROCEDURE;  Surgeon: Nunzio Cobbs, MD;  Location: [facility];  Service: Gynecology;  Laterality: N/A;  . COLONOSCOPY W/ POLYPECTOMY  06/24/15   Recall 5 yrs (Dr.  [facility])  . CYSTOSCOPY N/A 06/13/2017   Procedure: CYSTOSCOPY;  Surgeon: Nunzio Cobbs, MD;  Location: [facility];  Service: Gynecology;  Laterality: N/A;  . KNEE RECONSTRUCTION Right 2008  . LIVER BIOPSY  1997   fatty tumors     Current Meds  Medication Sig  . celecoxib (CELEBREX) 100 MG capsule Take 1 capsule (100 mg total) by mouth 2 (two) times daily.  Marland Kitchen escitalopram (LEXAPRO) 10 MG tablet TAKE 1 TABLET (10 MG TOTAL) BY MOUTH DAILY. NEEDS APPOINTMENT FOR FURTHER REFILLS  . levothyroxine (LEVOTHROID) 25 MCG tablet Take 1 tablet (25 mcg total) by mouth daily before breakfast.     Allergies:   Patient has no known allergies.   Social History   Tobacco Use  . Smoking status: Never Smoker  . Smokeless tobacco: Never Used  Substance Use Topics  . Alcohol use: Yes    Alcohol/week: 4.0 standard drinks    Types: 4 Glasses of wine per week  . Drug use: No     Family Hx: The patient's family history includes Alcohol abuse in her mother and sister; Arthritis in her father; CVA in her mother; Cancer in her father; Cancer (age of onset: 61) in her cousin and maternal aunt; Heart disease in her brother and mother; Heart murmur in her son, son, and son.  ROS:   Please see the history of present illness.     All other systems reviewed and are negative.   Prior CV studies:   The following studies were reviewed today: CT angiogram of the aorta 12/17/2018 Echocardiogram 03/24/2017  Labs/Other Tests and Data Reviewed:    EKG:  An ECG dated 11/20/2018 was personally reviewed today and demonstrated:  Sinus bradycardia, first-degree AV block, delayed R wave progression, otherwise normal  Recent Labs: 12/24/2018: BUN 17; Creatinine, Ser 0.73; Potassium 4.8; Sodium 143   Recent Lipid Panel Lab Results  Component Value Date/Time   CHOL 181 08/16/2017 09:08 AM   TRIG 122.0 08/16/2017 09:08 AM   TRIG 103 12/17/2012 12:00 AM   HDL 72.10 08/16/2017 09:08  AM   CHOLHDL 3 08/16/2017 09:08 AM   LDLCALC 85 08/16/2017 09:08 AM   LDLDIRECT 113 12/17/2012 12:00 AM    Wt Readings from Last 3 Encounters:  12/11/19 218 lb (98.9 kg)  09/18/19 220 lb (99.8 kg)  04/15/19 214 lb (97.1 kg)     Objective:    Vital Signs:  Ht 5\' 8"  (1.727 m)   Wt 218 lb (98.9 kg)   LMP 03/28/1996 (Within Months)   BMI 33.15 kg/m    VITAL SIGNS:  reviewed GEN:  no acute distress EYES:  sclerae anicteric, EOMI - Extraocular Movements Intact RESPIRATORY:  normal respiratory effort, symmetric expansion CARDIOVASCULAR:  no peripheral edema SKIN:  no rash, lesions or ulcers. MUSCULOSKELETAL:  no obvious deformities. NEURO:  alert and oriented x 3, no obvious focal deficit PSYCH:  normal affect  ASSESSMENT & PLAN:    1. Bradycardia: She has sinus bradycardia first-degree AV block but no symptoms to suggest severe bradycardia or high-grade AV  block.  I asked her to report symptoms of excessive fatigue, syncope/presyncope.  Follow-up as needed 2. Family history of aortic aneurysm: Normal aorta on CT angiography.  No follow-up recommended at this time.  COVID-19 Education: The signs and symptoms of COVID-19 were discussed with the patient and how to seek care for testing (follow up with PCP or arrange E-visit).  The importance of social distancing was discussed today.  Time:   Today, I have spent 21 minutes with the patient with telehealth technology discussing the above problems.     Medication Adjustments/Labs and Tests Ordered: Current medicines are reviewed at length with the patient today.  Concerns regarding medicines are outlined above.   Tests Ordered: No orders of the defined types were placed in this encounter.   Medication Changes: No orders of the defined types were placed in this encounter.   Follow Up:  In Person prn  Signed, Sanda Klein, MD  12/11/2019 8:14 AM    [place]

## 2019-12-11 NOTE — Patient Instructions (Signed)
Medication Instructions:  No chnages *If you need a refill on your cardiac medications before your next appointment, please call your pharmacy*  Lab Work: None ordered If you have labs (blood work) drawn today and your tests are completely normal, you will receive your results only by: Marland Kitchen MyChart Message (if you have MyChart) OR . A paper copy in the mail If you have any lab test that is abnormal or we need to change your treatment, we will call you to review the results.  Testing/Procedures: None ordered  Follow-Up: At [facility], you and your health needs are our priority.  As part of our continuing mission to provide you with exceptional heart care, we have created designated Provider Care Teams.  These Care Teams include your primary Cardiologist (physician) and Advanced Practice Providers (APPs -  Physician Assistants and Nurse Practitioners) who all work together to provide you with the care you need, when you need it.  Your next appointment:   Follow up as needed with Dr. Sallyanne Kuster

## 2019-12-12 ENCOUNTER — Encounter: Payer: Self-pay | Admitting: Family Medicine

## 2019-12-12 DIAGNOSIS — R001 Bradycardia, unspecified: Secondary | ICD-10-CM | POA: Insufficient documentation

## 2019-12-16 ENCOUNTER — Encounter: Payer: Self-pay | Admitting: Obstetrics and Gynecology

## 2019-12-16 ENCOUNTER — Other Ambulatory Visit: Payer: Self-pay

## 2019-12-16 ENCOUNTER — Ambulatory Visit: Payer: BC Managed Care – PPO | Admitting: Obstetrics and Gynecology

## 2019-12-16 VITALS — BP 120/74 | HR 60 | Temp 97.0°F | Resp 14 | Ht 67.0 in | Wt 225.6 lb

## 2019-12-16 DIAGNOSIS — Z01419 Encounter for gynecological examination (general) (routine) without abnormal findings: Secondary | ICD-10-CM | POA: Diagnosis not present

## 2019-12-16 DIAGNOSIS — N816 Rectocele: Secondary | ICD-10-CM

## 2019-12-16 MED ORDER — ESTRADIOL 0.1 MG/GM VA CREA: TOPICAL_CREAM | VAGINAL | 1 refills | Status: DC

## 2019-12-16 NOTE — Patient Instructions (Signed)

## 2019-12-16 NOTE — Progress Notes (Signed)
64 y.o. G3P3 Married Caucasian female here for annual exam.    Had a right knee replacement.   States she has to do vaginal splinting to have BMs due to rectocele. Does not do this all of the time.  Some urgency to void, but no leakage.   No vaginal bleeding.   Using vaginal estrogen cream twice weekly.  States she has gained weight during the pandemic.  PCP: Howard Pouch, DO   Patient's last menstrual period was 03/28/1996 (within months).           Sexually active: Yes.    The current method of family planning is status post hysterectomy.    Exercising: Yes.    recumbent bike, walking Smoker:  no  Health Maintenance: Pap:  Years ago--normal History of abnormal Pap:  no MMG: 08-07-19 3D/Neg/density B/BiRads1 Colonoscopy: 06-23-15 polyps;next due 05/2020 BMD: 05-10-18  Result :Normal TDaP: 03-07-18 Gardasil:   no HIV:04-10-15 NR Hep C: no Screening Labs:  PCP.   Flu vaccine completed.    reports that she has never smoked. She has never used smokeless tobacco. She reports current alcohol use of about 4.0 standard drinks of alcohol per week. She reports that she does not use drugs.  Past Medical History:  Diagnosis Date  . Arthritis   . Cancer ([place])    skin on nose  . Cervical disc disease   . Cystocele   . Heart murmur   . History of adenomatous polyp of colon 06/24/15  . History of rheumatic fever    childhood  . Hypothyroidism   . 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 surveillance  . Mitral valve prolapse   . PONV (postoperative nausea and vomiting)   . Primary osteoarthritis of right hip    Intraarticular steroid injection by Dr. Nelva Bush 01/2016  . Rectocele   . SA node dysfunction [facility])     Past Surgical History:  Procedure Laterality Date  . ABDOMINAL HYSTERECTOMY  05/1996   TVH--ovaries remain  . ABDOMINAL SACROCOLPOPEXY N/A 06/13/2017   Procedure: ABDOMINO SACROCOLPOPEXY with Halbans culdoplasty;  Surgeon: Nunzio Cobbs, MD;  Location: [facility];  Service: Gynecology;  Laterality: N/A;  . ANTERIOR AND POSTERIOR REPAIR N/A 06/13/2017   Procedure: ANTERIOR (CYSTOCELE) AND POSTERIOR REPAIR (RECTOCELE);  Surgeon: Nunzio Cobbs, MD;  Location: [facility];  Service: Gynecology;  Laterality: N/A;  . arthroscopic knee Right   . BACK SURGERY  2012   disc rupture  . BILATERAL SALPINGECTOMY Bilateral 06/13/2017   Procedure: BILATERAL SALPINGECTOMY;  Surgeon: Nunzio Cobbs, MD;  Location: [facility];  Service: Gynecology;  Laterality: Bilateral;  . BLADDER SUSPENSION N/A 06/13/2017   Procedure: TRANSVAGINAL TAPE (TVT) PROCEDURE;  Surgeon: Nunzio Cobbs, MD;  Location: [facility];  Service: Gynecology;  Laterality: N/A;  . COLONOSCOPY W/ POLYPECTOMY  06/24/15   Recall 5 yrs ([facility])  . CYSTOSCOPY N/A 06/13/2017   Procedure: CYSTOSCOPY;  Surgeon: Nunzio Cobbs, MD;  Location: [facility];  Service: Gynecology;  Laterality: N/A;  . KNEE RECONSTRUCTION Right 2008  . LIVER BIOPSY  1997   fatty tumors  . REPLACEMENT TOTAL KNEE Right 04/2019    Current Outpatient Medications  Medication Sig Dispense Refill  . calcium gluconate 500 MG tablet Take 1 tablet by mouth daily.     . celecoxib (CELEBREX) 100 MG capsule Take 1 capsule (100 mg total) by mouth  2 (two) times daily. 180 capsule 3  . escitalopram (LEXAPRO) 10 MG tablet TAKE 1 TABLET (10 MG TOTAL) BY MOUTH DAILY. NEEDS APPOINTMENT FOR FURTHER REFILLS 90 tablet 3  . levothyroxine (LEVOTHROID) 25 MCG tablet Take 1 tablet (25 mcg total) by mouth daily before breakfast. 90 tablet 3  . PREMARIN vaginal cream USE 1/2 G VAGINALLY 2 TIMES PER WEEK 30 g 0   No current facility-administered medications for this visit.    Family History  Problem Relation Age of Onset  . Alcohol abuse Mother   . CVA Mother        Dec 65  . Heart disease Mother   . Cancer Father        prostate  . Arthritis Father   . Heart murmur  Son   . Alcohol abuse Sister   . Heart disease Brother        congenital heart defect--Dec age 61 from MI  . Heart murmur Son   . Heart murmur Son   . Cancer Maternal Aunt 50       Dec colon CA age 83  . Cancer Cousin 50       Dec colon CA age 35    Review of Systems  All other systems reviewed and are negative.   Exam:   BP 120/74 (Cuff Size: Large)   Pulse 60   Temp (!) 97 F (36.1 C) (Temporal)   Resp 14   Ht 5\' 7"  (1.702 m)   Wt 225 lb 9.6 oz (102.3 kg)   LMP 03/28/1996 (Within Months)   BMI 35.33 kg/m     General appearance: alert, cooperative and appears stated age Head: normocephalic, without obvious abnormality, atraumatic Neck: no adenopathy, supple, symmetrical, trachea midline and thyroid normal to inspection and palpation Lungs: clear to auscultation bilaterally Breasts: normal appearance, no masses or tenderness, No nipple retraction or dimpling, No nipple discharge or bleeding, No axillary adenopathy Heart: regular rate and rhythm Abdomen: soft, non-tender; no masses, no organomegaly Extremities: extremities normal, atraumatic, no cyanosis or edema Skin: skin color, texture, turgor normal. No rashes or lesions Lymph nodes: cervical, supraclavicular, and axillary nodes normal. Neurologic: grossly normal  Pelvic: External genitalia:  no lesions              No abnormal inguinal nodes palpated.              Urethra:  normal appearing urethra with no masses, tenderness or lesions              Bartholins and Skenes: normal                 Vagina: normal appearing vagina with normal color and discharge,  3 x 7 mm area of mesh exposure.   High rectocele noted.              Cervix:absent.               Pap taken: No. Bimanual Exam:  Uterus:  absent              Adnexa: no mass, fullness, tenderness              Rectal exam: Yes.  .  Confirms.              Anus:  normal sphincter tone, no lesions  Chaperone was present for exam.  Assessment:   Well woman  visit with normal exam. Status post abdominal sacrocolpopexy with anterior and posterior colporrhaphy, TVT  Exact and cystoscopy.  Rectocele.  This is an area that was not previously treated surgically.  Mesh exposure posterior vaginal apex.  Stable.  Asymptomatic.   Plan: Mammogram screening discussed. Self breast awareness reviewed. Pap and HR HPV as above. Guidelines for Calcium, Vitamin D, regular exercise program including cardiovascular and weight bearing exercise. Refill of generic vaginal estrogen cream.  We discussed potential effect of estrogen on breast cancer.  We reviewed PT, pessary, Miralax, and rectocele surgery to treat her rectocele.  I offered her a second opinion if she would like.  She will try Miralax for 6 months and then return as needed. She will do labs with her PCP.  I recommended she add hep C aby to this.  Follow up annually and prn.     After visit summary provided.

## 2019-12-17 ENCOUNTER — Ambulatory Visit (INDEPENDENT_AMBULATORY_CARE_PROVIDER_SITE_OTHER): Payer: BC Managed Care – PPO | Admitting: Family Medicine

## 2019-12-17 ENCOUNTER — Encounter: Payer: Self-pay | Admitting: Family Medicine

## 2019-12-17 VITALS — BP 119/74 | HR 61 | Temp 97.6°F | Resp 16 | Ht 68.0 in | Wt 226.5 lb

## 2019-12-17 DIAGNOSIS — Z791 Long term (current) use of non-steroidal anti-inflammatories (NSAID): Secondary | ICD-10-CM

## 2019-12-17 NOTE — Progress Notes (Signed)
This visit occurred during the SARS-CoV-2 public health emergency.  Safety protocols were in place, including screening questions prior to the visit, additional usage of staff PPE, and extensive cleaning of exam room while observing appropriate contact time as indicated for disinfecting solutions.    Cynthia [name] , 11-04-1956, 64 y.o., female MRN: [id number] Patient Care Team    Relationship Specialty Notifications Start End  Ma Hillock, DO PCP - General Family Medicine  07/14/15   Sanda Klein, MD PCP - Cardiology Cardiology Admissions 11/30/18   Rosana Berger, MD Consulting Physician Gastroenterology  06/29/15   Melina Schools, MD Consulting Physician Orthopedic Surgery  04/13/16     Chief Complaint  Patient presents with   Medication Problem    Pt was told by OBGYN that if pt has not been checked for Hep C to have done. Ortho told patient to make sure kidney function was being checked at appts due to taking celebrex now      Subjective: Pt presents for an OV to discuss hep C screening and kidney function.  Patient reports she was told by her gynecologist she should inquire about having hepatitis C screening.  Patient has had hepatitis C screening already on May 25, 2016.  This does not need to be repeated.  She reports her cardiologist told her to have her kidney function monitored since she has been on Celebrex chronically.  Celebrex was started October/2020 for patient's chronic arthritic pain.  She had been on Mobic prior to that intermittently and it was not helpful.  After Celebrex started October/2020 patient was to follow-up in December for her physical in which a BMP would have been collected.  Patient has had routine physicals with blood work and her last BMP which January 27/2020 and was normal.  Depression screen [facility] 2/9 06/20/2018 08/16/2017 05/25/2016  Decreased Interest 0 0 0  Down, Depressed, Hopeless 0 0 0  PHQ - 2 Score 0 0 0   No Known  Allergies Social History   Social History Narrative   Ms. Helmers lives in [place] with her husband.  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 She is from [place].   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 surveillance   Mitral valve prolapse    PONV (postoperative nausea and vomiting)    Primary osteoarthritis of right hip    Intraarticular steroid injection by Dr. Nelva Bush 01/2016   Rectocele    SA node dysfunction [facility])    Past Surgical History:  Procedure Laterality Date   ABDOMINAL HYSTERECTOMY  05/1996   TVH--ovaries remain   ABDOMINAL SACROCOLPOPEXY N/A 06/13/2017   Procedure: ABDOMINO SACROCOLPOPEXY with Halbans culdoplasty;  Surgeon: Nunzio Cobbs, MD;  Location: [facility];  Service: Gynecology;  Laterality: N/A;   ANTERIOR AND POSTERIOR REPAIR N/A 06/13/2017   Procedure: ANTERIOR (CYSTOCELE) AND POSTERIOR REPAIR (RECTOCELE);  Surgeon: Nunzio Cobbs, MD;  Location: [facility];  Service: Gynecology;  Laterality: N/A;   arthroscopic knee Right  BACK SURGERY  2012   disc rupture   BILATERAL SALPINGECTOMY Bilateral 06/13/2017   Procedure: BILATERAL SALPINGECTOMY;  Surgeon: Nunzio Cobbs, MD;  Location: [facility];  Service: Gynecology;  Laterality: Bilateral;   BLADDER SUSPENSION N/A 06/13/2017   Procedure: TRANSVAGINAL TAPE (TVT) PROCEDURE;  Surgeon: Nunzio Cobbs, MD;  Location: [facility];  Service: Gynecology;  Laterality: N/A;   COLONOSCOPY W/ POLYPECTOMY  06/24/15   Recall 5 yrs ([facility])   CYSTOSCOPY N/A 06/13/2017   Procedure: Consuela Mimes;  Surgeon:  Nunzio Cobbs, MD;  Location: [facility];  Service: Gynecology;  Laterality: N/A;   KNEE RECONSTRUCTION Right 2008   LIVER BIOPSY  1997   fatty tumors   REPLACEMENT TOTAL KNEE Right 04/2019   Family History  Problem Relation Age of Onset   Alcohol abuse Mother    CVA Mother        Dec 65   Heart disease Mother    Cancer Father        prostate   Arthritis Father    Heart murmur Son    Alcohol abuse Sister    Heart disease Brother        congenital heart defect--Dec age 63 from MI   Heart murmur Son    Heart murmur Son    Cancer Maternal Aunt 50       Dec colon CA age 44   Cancer Cousin 50       Dec colon CA age 34   Allergies as of 12/17/2019   No Known Allergies     Medication List       Accurate as of December 17, 2019 11:59 PM. If you have any questions, ask your nurse or doctor.        calcium gluconate 500 MG tablet Take 1 tablet by mouth daily.   celecoxib 100 MG capsule Commonly known as: CELEBREX Take 1 capsule (100 mg total) by mouth 2 (two) times daily.   escitalopram 10 MG tablet Commonly known as: LEXAPRO TAKE 1 TABLET (10 MG TOTAL) BY MOUTH DAILY. NEEDS APPOINTMENT FOR FURTHER REFILLS   estradiol 0.1 MG/GM vaginal cream Commonly known as: ESTRACE Use 1/2 g vaginally two or three times per week as needed to maintain symptom relief.   levothyroxine 25 MCG tablet Commonly known as: Levothroid Take 1 tablet (25 mcg total) by mouth daily before breakfast.       All past medical history, surgical history, allergies, family history, immunizations andmedications were updated in the EMR today and reviewed under the history and medication portions of their EMR.     ROS: Negative, with the exception of above mentioned in HPI   Objective:  BP 119/74 (BP Location: Right Arm, Patient Position: Sitting, Cuff Size: Normal)    Pulse 61    Temp 97.6 F (36.4 C) (Temporal)    Resp 16    Ht '5\' 8"'$  (1.727 m)    Wt 226 lb 8 oz (102.7 kg)     LMP 03/28/1996 (Within Months)    SpO2 97%    BMI 34.44 kg/m  Body mass index is 34.44 kg/m. Gen: Afebrile. No acute distress. Nontoxic in appearance, well developed, well nourished.  HENT: AT. .  Eyes:Pupils Equal Round Reactive to light, Extraocular movements intact,  Conjunctiva without redness, discharge or icterus. CV: RRR, no edema Chest: CTAB, no wheeze or crackles. Good air movement, normal resp effort.  Neuro:  Normal gait. PERLA. EOMi. Alert.  Oriented x3  Psych: Normal affect, dress and demeanor. Normal speech. Normal thought content and judgment.  No exam data present No results found. No results found for this or any previous visit (from the past 24 hour(s)).  Assessment/Plan: Cynthia [name] is a 64 y.o. female present for OV for  Encounter for long-term (current) use of NSAIDs Patient's kidney function has been stable.  Long-term NSAIDs was initiated with Celebrex October/2020.  Discussed monitoring kidney function 1-2 times yearly when on chronic NSAIDs.  Patient is agreeable to testing today. - Comp Met (CMET) - CBC -Reminded patient to schedule CPE.   Reviewed expectations re: course of current medical issues.  Discussed self-management of symptoms.  Outlined signs and symptoms indicating need for more acute intervention.  Patient verbalized understanding and all questions were answered.  Patient received an After-Visit Summary.    Orders Placed This Encounter  Procedures   Comp Met (CMET)   CBC     Note is dictated utilizing voice recognition software. Although note has been proof read prior to signing, occasional typographical errors still can be missed. If any questions arise, please do not hesitate to call for verification.   electronically signed by:  Howard Pouch, DO  [place]

## 2019-12-17 NOTE — Patient Instructions (Signed)
We are committed to keeping you informed about the COVID-19 vaccine.  As the vaccine continues to become available for each phase, we will ensure that patients who meet the criteria receive the information they need to access vaccination opportunities. Continue to check your MyChart account and [email or web address] for updates. Please review the Phase 1b information below.  Following [place]'s guidelines for the distribution of COVID-19 vaccines we are pleased to share our plans to begin offering vaccines to those 65 and older (Phase 1b). Here are details of those plans:  [facility] [place] On Tuesday, Jan. 19, the [place] [facility] of [facility] ([facility]) and [place] begin large-scale COVID-19 vaccinations at the [facility]. The vaccinations are appointment only and for those 65 and older.  Walk-ins will not be accepted.  All appointments are currently filled. Please join our waiting list for the next available appointments. We will contact you when appointments become available. Please do not sign up more than once.  Join Our Waiting List   Other Vaccination Opportunities in Our Area We are also working in partnership with county health agencies in our service counties to ensure continuing vaccination availability in the weeks and months ahead. Learn more about each county's vaccination efforts in the website links below:   [place]  [place]  [place]  [place]  [place]   [employer]'s phase 1b vaccination guidelines, prioritizing those 65 and over as the next eligible group to receive the COVID-19 vaccine, are detailed at [employer]Your[place].[place].gov.   Vaccine Safety and Effectiveness Clinical trials for the Pfizer COVID-19 vaccine involved 42,000 people and showed that the vaccine is more than 95% effective in preventing COVID-19 with no serious safety concerns. Similar results have  been reported for the Moderna COVID-19 vaccine. Side effects reported in the Pfizer clinical trials include a sore arm at the injection site, fatigue, headache, chills and fever. While side effects from the Pfizer COVID-19 vaccine are higher than for a typical flu vaccine, they are lower in many ways than side effects from the leading vaccine to prevent shingles. Side effects are signs that a vaccine is working and are related to your immune system being stimulated to produce antibodies against infection. Side effects from vaccination are far less significant than health impacts from COVID-19.  Staying Informed Pharmacists, infectious disease doctors, critical care nurses and other experts at [facility] continue to speak publicly through media interviews and direct communication with our patients and communities about the safety, effectiveness and importance of vaccines to eliminate COVID-19. In addition, reliable information on vaccine safety, effectiveness, side effects and more is available on the following websites:  [place] Department of Health and Human Services COVID-19 Vaccine Information Website.  U.S. Centers for Disease Control and Prevention COVID-19 Vaccine Public Information Website.  Staying Safe We agree with the CDC on what we can do to help our communities get back to normal: Getting "back to normal" is going to take all of our tools. If we use all the tools we have, we stand the best chance of getting our families, communities, schools and workplaces "back to normal" sooner:  Get vaccinated as soon as vaccines become available within the phase of the state's vaccination rollout plan for which you meet the eligibility criteria.  Wear a mask.  Stay 6 feet from others and avoid crowds.  Wash hands often.  For our most current information, please visit [email or web address].  

## 2019-12-18 LAB — CBC
HCT: 36.9 % (ref 35.0–45.0)
Hemoglobin: 12.8 g/dL (ref 11.7–15.5)
MCH: 30.7 pg (ref 27.0–33.0)
MCHC: 34.7 g/dL (ref 32.0–36.0)
MCV: 88.5 fL (ref 80.0–100.0)
MPV: 9.3 fL (ref 7.5–12.5)
Platelets: 266 10*3/uL (ref 140–400)
RBC: 4.17 10*6/uL (ref 3.80–5.10)
RDW: 13.6 % (ref 11.0–15.0)
WBC: 5.8 10*3/uL (ref 3.8–10.8)

## 2019-12-18 LAB — COMPREHENSIVE METABOLIC PANEL
AG Ratio: 2.1 (calc) (ref 1.0–2.5)
ALT: 16 U/L (ref 6–29)
AST: 17 U/L (ref 10–35)
Albumin: 4.4 g/dL (ref 3.6–5.1)
Alkaline phosphatase (APISO): 87 U/L (ref 37–153)
BUN: 18 mg/dL (ref 7–25)
CO2: 22 mmol/L (ref 20–32)
Calcium: 9.4 mg/dL (ref 8.6–10.4)
Chloride: 106 mmol/L (ref 98–110)
Creat: 0.82 mg/dL (ref 0.50–0.99)
Globulin: 2.1 g/dL (calc) (ref 1.9–3.7)
Glucose, Bld: 82 mg/dL (ref 65–99)
Potassium: 4.3 mmol/L (ref 3.5–5.3)
Sodium: 140 mmol/L (ref 135–146)
Total Bilirubin: 0.4 mg/dL (ref 0.2–1.2)
Total Protein: 6.5 g/dL (ref 6.1–8.1)

## 2020-01-27 ENCOUNTER — Telehealth: Payer: Self-pay

## 2020-01-27 DIAGNOSIS — R232 Flushing: Secondary | ICD-10-CM

## 2020-01-27 MED ORDER — ESCITALOPRAM OXALATE 10 MG PO TABS: 10.0000 mg | ORAL_TABLET | Freq: Every day | ORAL | 1 refills | Status: DC

## 2020-01-27 NOTE — Telephone Encounter (Signed)
Retrieved from voicemail left on Friday 2/26 3:57PM  Patient request refill   escitalopram (LEXAPRO) 10 MG tablet [id number]   [id number] [address], [place]

## 2020-01-27 NOTE — Telephone Encounter (Signed)
RF request for Lexapro  LOV: 12/17/19 CPE  Next ov: Not scheduled  Last written: 12/31/2018 #90 x3 refills

## 2020-01-27 NOTE — Addendum Note (Signed)
Addended by: Howard Pouch A on: 01/27/2020 11:18 AM   Modules accepted: Orders

## 2020-01-27 NOTE — Telephone Encounter (Signed)
Pt did not have a CPE on 11/2019>> she was seen for kidney fx and celebrex use.  She was encouraged to make a CPE appt  At her 12/17/2019. I have refilled her lexapro for her for 3 mos with 1 refill. Please schedule her CPE - will need to be seen prior to future refills-- this gives her 6 mos to work into the schedule at her convenience.

## 2020-01-28 NOTE — Telephone Encounter (Signed)
Pt was called and detailed message was left letting patient know that medication had been sent to the pharmacy but she would need to be scheduled for CPE and be seen prior to anymore refills being sent to the pharmacy

## 2020-02-11 ENCOUNTER — Telehealth: Payer: Self-pay

## 2020-02-11 MED ORDER — LEVOTHYROXINE SODIUM 25 MCG PO TABS: 25.0000 ug | ORAL_TABLET | Freq: Every day | ORAL | 0 refills | Status: DC

## 2020-02-11 NOTE — Telephone Encounter (Signed)
30 day supply sent to pharmacy. Pt notified via my chart

## 2020-02-11 NOTE — Telephone Encounter (Signed)
Patient refill request. Patient scheduled her CPE / [facility] refill meds appt with Dr. Raoul Pitch on 3/31.   levothyroxine (LEVOTHROID) 25 MCG tablet [id number]   [facility] [address]

## 2020-02-14 ENCOUNTER — Encounter: Payer: Self-pay | Admitting: Family Medicine

## 2020-02-26 ENCOUNTER — Other Ambulatory Visit: Payer: Self-pay

## 2020-02-26 ENCOUNTER — Ambulatory Visit (INDEPENDENT_AMBULATORY_CARE_PROVIDER_SITE_OTHER): Payer: BC Managed Care – PPO | Admitting: Family Medicine

## 2020-02-26 ENCOUNTER — Encounter: Payer: Self-pay | Admitting: Family Medicine

## 2020-02-26 ENCOUNTER — Other Ambulatory Visit: Payer: Self-pay | Admitting: Family Medicine

## 2020-02-26 VITALS — BP 118/78 | HR 74 | Temp 97.8°F | Ht 68.0 in | Wt 231.0 lb

## 2020-02-26 DIAGNOSIS — E669 Obesity, unspecified: Secondary | ICD-10-CM | POA: Diagnosis not present

## 2020-02-26 DIAGNOSIS — Z131 Encounter for screening for diabetes mellitus: Secondary | ICD-10-CM

## 2020-02-26 DIAGNOSIS — E039 Hypothyroidism, unspecified: Secondary | ICD-10-CM

## 2020-02-26 DIAGNOSIS — R001 Bradycardia, unspecified: Secondary | ICD-10-CM

## 2020-02-26 DIAGNOSIS — R232 Flushing: Secondary | ICD-10-CM

## 2020-02-26 DIAGNOSIS — Z0000 Encounter for general adult medical examination without abnormal findings: Secondary | ICD-10-CM | POA: Diagnosis not present

## 2020-02-26 DIAGNOSIS — E559 Vitamin D deficiency, unspecified: Secondary | ICD-10-CM

## 2020-02-26 DIAGNOSIS — R61 Generalized hyperhidrosis: Secondary | ICD-10-CM

## 2020-02-26 DIAGNOSIS — Z86010 Personal history of colonic polyps: Secondary | ICD-10-CM

## 2020-02-26 DIAGNOSIS — I341 Nonrheumatic mitral (valve) prolapse: Secondary | ICD-10-CM | POA: Diagnosis not present

## 2020-02-26 LAB — LIPID PANEL
Cholesterol: 192 mg/dL (ref 0–200)
HDL: 69.8 mg/dL (ref >39.00)
LDL Cholesterol: 101 mg/dL — ABNORMAL HIGH (ref 0–99)
NonHDL: 121.82
Total CHOL/HDL Ratio: 3
Triglycerides: 106 mg/dL (ref 0.0–149.0)
VLDL: 21.2 mg/dL (ref 0.0–40.0)

## 2020-02-26 LAB — VITAMIN D 25 HYDROXY (VIT D DEFICIENCY, FRACTURES): VITD: 27.61 ng/mL — ABNORMAL LOW (ref 30.00–100.00)

## 2020-02-26 LAB — HEMOGLOBIN A1C: Hgb A1c MFr Bld: 5.6 % (ref 4.6–6.5)

## 2020-02-26 LAB — TSH: TSH: 2.16 u[IU]/mL (ref 0.35–4.50)

## 2020-02-26 MED ORDER — LEVOTHYROXINE SODIUM 25 MCG PO TABS: 25.0000 ug | ORAL_TABLET | Freq: Every day | ORAL | 3 refills | Status: DC

## 2020-02-26 MED ORDER — CELECOXIB 100 MG PO CAPS: 100.0000 mg | ORAL_CAPSULE | Freq: Two times a day (BID) | ORAL | 0 refills | Status: DC

## 2020-02-26 MED ORDER — ESCITALOPRAM OXALATE 10 MG PO TABS: 10.0000 mg | ORAL_TABLET | Freq: Every day | ORAL | 0 refills | Status: DC

## 2020-02-26 NOTE — Patient Instructions (Signed)
Health Maintenance, Female Adopting a healthy lifestyle and getting preventive care are important in promoting health and wellness. Ask your health care provider about:  The right schedule for you to have regular tests and exams.  Things you can do on your own to prevent diseases and keep yourself healthy. What should I know about diet, weight, and exercise? Eat a healthy diet   Eat a diet that includes plenty of vegetables, fruits, low-fat dairy products, and lean protein.  Do not eat a lot of foods that are high in solid fats, added sugars, or sodium. Maintain a healthy weight Body mass index (BMI) is used to identify weight problems. It estimates body fat based on height and weight. Your health care provider can help determine your BMI and help you achieve or maintain a healthy weight. Get regular exercise Get regular exercise. This is one of the most important things you can do for your health. Most adults should:  Exercise for at least 150 minutes each week. The exercise should increase your heart rate and make you sweat (moderate-intensity exercise).  Do strengthening exercises at least twice a week. This is in addition to the moderate-intensity exercise.  Spend less time sitting. Even light physical activity can be beneficial. Watch cholesterol and blood lipids Have your blood tested for lipids and cholesterol at 64 years of age, then have this test every 5 years. Have your cholesterol levels checked more often if:  Your lipid or cholesterol levels are high.  You are older than 64 years of age.  You are at high risk for heart disease. What should I know about cancer screening? Depending on your health history and family history, you may need to have cancer screening at various ages. This may include screening for:  Breast cancer.  Cervical cancer.  Colorectal cancer.  Skin cancer.  Lung cancer. What should I know about heart disease, diabetes, and high blood  pressure? Blood pressure and heart disease  High blood pressure causes heart disease and increases the risk of stroke. This is more likely to develop in people who have high blood pressure readings, are of African descent, or are overweight.  Have your blood pressure checked: ? Every 3-5 years if you are 18-39 years of age. ? Every year if you are 40 years old or older. Diabetes Have regular diabetes screenings. This checks your fasting blood sugar level. Have the screening done:  Once every three years after age 40 if you are at a normal weight and have a low risk for diabetes.  More often and at a younger age if you are overweight or have a high risk for diabetes. What should I know about preventing infection? Hepatitis B If you have a higher risk for hepatitis B, you should be screened for this virus. Talk with your health care provider to find out if you are at risk for hepatitis B infection. Hepatitis C Testing is recommended for:  Everyone born from 1945 through 1965.  Anyone with known risk factors for hepatitis C. Sexually transmitted infections (STIs)  Get screened for STIs, including gonorrhea and chlamydia, if: ? You are sexually active and are younger than 64 years of age. ? You are older than 64 years of age and your health care provider tells you that you are at risk for this type of infection. ? Your sexual activity has changed since you were last screened, and you are at increased risk for chlamydia or gonorrhea. Ask your health care provider if   you are at risk.  Ask your health care provider about whether you are at high risk for HIV. Your health care provider may recommend a prescription medicine to help prevent HIV infection. If you choose to take medicine to prevent HIV, you should first get tested for HIV. You should then be tested every 3 months for as long as you are taking the medicine. Pregnancy  If you are about to stop having your period (premenopausal) and  you may become pregnant, seek counseling before you get pregnant.  Take 400 to 800 micrograms (mcg) of folic acid every day if you become pregnant.  Ask for birth control (contraception) if you want to prevent pregnancy. Osteoporosis and menopause Osteoporosis is a disease in which the bones lose minerals and strength with aging. This can result in bone fractures. If you are 65 years old or older, or if you are at risk for osteoporosis and fractures, ask your health care provider if you should:  Be screened for bone loss.  Take a calcium or vitamin D supplement to lower your risk of fractures.  Be given hormone replacement therapy (HRT) to treat symptoms of menopause. Follow these instructions at home: Lifestyle  Do not use any products that contain nicotine or tobacco, such as cigarettes, e-cigarettes, and chewing tobacco. If you need help quitting, ask your health care provider.  Do not use street drugs.  Do not share needles.  Ask your health care provider for help if you need support or information about quitting drugs. Alcohol use  Do not drink alcohol if: ? Your health care provider tells you not to drink. ? You are pregnant, may be pregnant, or are planning to become pregnant.  If you drink alcohol: ? Limit how much you use to 0-1 drink a day. ? Limit intake if you are breastfeeding.  Be aware of how much alcohol is in your drink. In the [place] one drink equals one 12 oz bottle of beer (355 mL), one 5 oz glass of wine (148 mL), or one 1 oz glass of hard liquor (44 mL). General instructions  Schedule regular health, dental, and eye exams.  Stay current with your vaccines.  Tell your health care provider if: ? You often feel depressed. ? You have ever been abused or do not feel safe at home. Summary  Adopting a healthy lifestyle and getting preventive care are important in promoting health and wellness.  Follow your health care provider's instructions about healthy  diet, exercising, and getting tested or screened for diseases.  Follow your health care provider's instructions on monitoring your cholesterol and blood pressure. This information is not intended to replace advice given to you by your health care provider. Make sure you discuss any questions you have with your health care provider. Document Revised: 11/07/2018 Document Reviewed: 11/07/2018 Elsevier Patient Education  2020 Elsevier Inc.  

## 2020-02-26 NOTE — Progress Notes (Addendum)
This visit occurred during the SARS-CoV-2 public health emergency.  Safety protocols were in place, including screening questions prior to the visit, additional usage of staff PPE, and extensive cleaning of exam room while observing appropriate contact time as indicated for disinfecting solutions.    Patient ID: Cynthia [name], female  DOB: [birth year], 64 y.o.   MRN: [id number] Patient Care Team    Relationship Specialty Notifications Start End  Ma Hillock, DO PCP - General Family Medicine  07/14/15   Sanda Klein, MD PCP - Cardiology Cardiology Admissions 11/30/18   Rosana Berger, MD Consulting Physician Gastroenterology  06/29/15   Melina Schools, MD Consulting Physician Orthopedic Surgery  04/13/16     Chief Complaint  Patient presents with  . Annual Exam    Pap smear 2021. Mammogram 08/07/2019. Not fasting,     Subjective:  Cynthia [name] is a 64 y.o.  Female  present for CPE. All past medical history, surgical history, allergies, family history, immunizations, medications and social history were updated in the electronic medical record today. All recent labs, ED visits and hospitalizations within the last year were reviewed.  Health maintenance:  Colonoscopy: UTD 2016, Dr. Glennon Hamilton ([facility]). Polyps present. Pt states 5 year f/u. She Has an appt this summer.  Mammogram: Patient will like to be screened every 2 years, last mammogram 07/2019. BI-RADS 1. No family history present. Next 07/2021. Cervical cancer screening: Hysterectomy, Pap smear not indicated. Has GYN for pelvics. Immunizations: Tetanus completed 2019, influenza UTD 2020 encouraged yearly. shingrix series completed. COVID scheduled for this Friday.  Infectious disease screening: HIV completed, hepatitis C completed. DEXA: 04/2018 UTD- normal Assistive device: none Oxygen [id number] Patient has a Dental home. Hospitalizations/ED visits: reviewed Hypothyroidism: Patient reports compliance  with levothyroxine 25 mcg daily.  Last TSH 11/12/2018 3.13. Hot flashes: Patient reports Lexapro 10 mg daily is working very well for her hot flashes.  She had ran out for a few days and her hot flashes had returned quite severely. Osteoarthritis/ DDD lumbar spine: She reports Celebrex is helpful for her arthritis.  Recent kidney function test is normal.  Depression screen [facility] 2/9 02/26/2020 06/20/2018 08/16/2017 05/25/2016  Decreased Interest 0 0 0 0  Down, Depressed, Hopeless 0 0 0 0  PHQ - 2 Score 0 0 0 0   No flowsheet data found.   Immunization History  Administered Date(s) Administered  . Influenza Inj Mdck Quad Pf 09/07/2019  . Influenza,inj,Quad PF,6+ Mos 09/15/2018  . Influenza,inj,quad, With Preservative 09/07/2019  . Influenza-Unspecified 07/21/2017, 09/15/2018  . Tdap 02/07/2008, 03/07/2018  . Zoster Recombinat (Shingrix) 09/07/2019, 12/13/2019    Past Medical History:  Diagnosis Date  . Arthritis   . Cancer ([facility])    skin on nose  . Cervical disc disease   . Cystocele   . Heart murmur   . History of adenomatous polyp of colon 06/24/15  . History of rheumatic fever    childhood  . Hypothyroidism   . 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 surveillance  . Mitral valve prolapse   . PONV (postoperative nausea and vomiting)   . Primary osteoarthritis of right hip    Intraarticular steroid injection by Dr. Nelva Bush 01/2016  . Rectocele   . SA node dysfunction ([facility])    No Known Allergies Past Surgical History:  Procedure Laterality Date  . ABDOMINAL HYSTERECTOMY  05/1996   TVH--ovaries remain  . ABDOMINAL SACROCOLPOPEXY N/A 06/13/2017   Procedure:  ABDOMINO SACROCOLPOPEXY with [place] culdoplasty;  Surgeon: Nunzio Cobbs, MD;  Location: [facility];  Service: Gynecology;  Laterality: N/A;  . ANTERIOR AND POSTERIOR REPAIR N/A 06/13/2017   Procedure: ANTERIOR (CYSTOCELE) AND POSTERIOR REPAIR (RECTOCELE);  Surgeon: Nunzio Cobbs, MD;   Location: [facility];  Service: Gynecology;  Laterality: N/A;  . arthroscopic knee Right   . BACK SURGERY  2012   disc rupture  . BILATERAL SALPINGECTOMY Bilateral 06/13/2017   Procedure: BILATERAL SALPINGECTOMY;  Surgeon: Nunzio Cobbs, MD;  Location: [facility];  Service: Gynecology;  Laterality: Bilateral;  . BLADDER SUSPENSION N/A 06/13/2017   Procedure: TRANSVAGINAL TAPE (TVT) PROCEDURE;  Surgeon: Nunzio Cobbs, MD;  Location: [facility];  Service: Gynecology;  Laterality: N/A;  . COLONOSCOPY W/ POLYPECTOMY  06/24/15   Recall 5 yrs ([facility])  . CYSTOSCOPY N/A 06/13/2017   Procedure: CYSTOSCOPY;  Surgeon: Nunzio Cobbs, MD;  Location: [facility];  Service: Gynecology;  Laterality: N/A;  . KNEE RECONSTRUCTION Right 2008  . LIVER BIOPSY  1997   fatty tumors  . 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 Alcohol abuse Mother   . CVA Mother        Dec 65  . Heart disease Mother   . Cancer Father        prostate  . Arthritis Father   . Heart murmur Son   . Alcohol abuse Sister   . Heart disease Brother        congenital heart defect--Dec age 72 from MI  . Heart murmur Son   . Heart murmur Son   . Cancer Maternal Aunt 50       Dec colon CA age 81  . Cancer Cousin 75       Dec colon CA age 1   Social History   Social History Narrative   Cynthia [name] lives in [place] with her husband.  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 She is from [place].    Allergies as of 02/26/2020   No Known Allergies     Medication List       Accurate as of February 26, 2020  2:05 PM. If you have any questions, ask your nurse or doctor.        calcium gluconate 500 MG tablet Take 1 tablet by mouth daily.   celecoxib 100 MG capsule Commonly known as: CELEBREX Take 1 capsule (100 mg total) by mouth 2 (two) times daily.     escitalopram 10 MG tablet Commonly known as: LEXAPRO Take 1 tablet (10 mg total) by mouth daily. Needs appointment for further refills   estradiol 0.1 MG/GM vaginal cream Commonly known as: ESTRACE Use 1/2 g vaginally two or three times per week as needed to maintain symptom relief.   levothyroxine 25 MCG tablet Commonly known as: SYNTHROID Take 1 tablet (25 mcg total) by mouth daily before breakfast.   Vitamin D 125 MCG (5000 UT) Caps Take by mouth. Daily       All past medical history, surgical history, allergies, family history, immunizations andmedications were updated in the EMR today and reviewed under the history and medication portions of their EMR.       ROS: 14 pt review of systems performed and negative (unless mentioned in an HPI)  Objective:  BP 118/78 (BP Location: Left Arm, Patient Position: Sitting, Cuff Size: Normal)   Pulse 74   Temp 97.8 F (36.6 C) (Temporal)   Ht 5\' 8"  (1.727 m)   Wt 231 lb (104.8 kg)   LMP 03/28/1996 (Within Months)   SpO2 98%   BMI 35.12 kg/m  Gen: Afebrile. No acute distress. Nontoxic in appearance, well-developed, well-nourished,  Obese female.  HENT: AT. [place]. Bilateral TM visualized and normal in appearance, normal external auditory canal. MMM, no oral lesions, adequate dentition. Bilateral nares within normal limits. Throat without erythema, ulcerations or exudates. no Cough on exam, no hoarseness on exam. Eyes:Pupils Equal Round Reactive to light, Extraocular movements intact,  Conjunctiva without redness, discharge or icterus. Neck/lymp/endocrine: Supple,no lymphadenopathy, no thyromegaly CV: RRR no murmur, no edema, +2/4 P posterior tibialis pulses.  Chest: CTAB, no wheeze, rhonchi or crackles. normal Respiratory effort. good Air movement. Abd: Soft. flat. NTND. BS present. no Masses palpated. No hepatosplenomegaly. No rebound tenderness or guarding. Skin: no rashes, purpura or petechiae. Warm and well-perfused. Skin  intact. Neuro/Msk:  Normal gait. PERLA. EOMi. Alert. Oriented x3.  Cranial nerves II through XII intact. Muscle strength 5/5 upper/lower extremity. DTRs equal bilaterally. Psych: Normal affect, dress and demeanor. Normal speech. Normal thought content and judgment.   No exam data present  Assessment/plan: SASKIA AVETISYAN is a 64 y.o. female present for CPE  Vitamin D deficiency Has been taking high dose 5000 units for 1 month. - Vitamin D (25 hydroxy)  Obesity (BMI 30-39.9) Routine exercise and dietary changes Lipid panel AV junctional bradycardia/MVP (mitral valve prolapse) Continue to follow with cardiology as directed - Lipid panel  Hypothyroidism, unspecified type We will refill thyroid medication after lab results are received to ensure appropriate dose.  Current dose levothyroxine 25 mcg daily - Lipid panel - TSH  Diabetes mellitus screening - Hemoglobin A1c  Vasomotor flushing/excessive sweating Lexapro 10 mg is working well for her. Continue Lexapro 10 mg daily  History of colon polyps Has follow-up colonoscopy scheduled for summer  Encounter for preventive health examination Patient was encouraged to exercise greater than 150 minutes a week. Patient was encouraged to choose a diet filled with fresh fruits and vegetables, and lean meats. AVS provided to patient today for education/recommendation on gender specific health and safety maintenance. Colonoscopy: She Has an appt this summer.  Mammogram: Patient will like to be screened every 2 years,Next 07/2021. Cervical cancer screening:Pap smear not indicated. Has GYN for pelvics. Immunizations: Tetanus completed 2019, influenza UTD 2020 encouraged yearly. shingrix series completed. COVID scheduled for this Friday.  Infectious disease screening: HIV completed, hepatitis C completed. DEXA: 04/2018 UTD- normal Return in about 1 year (around 02/25/2021) for CPE (30 min) and 6 mos [facility].  Orders Placed This Encounter   Procedures  . Hemoglobin A1c  . Lipid panel  . TSH  . Vitamin D (25 hydroxy)    Meds ordered this encounter  Medications  . celecoxib (CELEBREX) 100 MG capsule    Sig: Take 1 capsule (100 mg total) by mouth 2 (two) times daily.    Dispense:  180 capsule    Refill:  0    Hold on file. Please add to existing refills.  Marland Kitchen escitalopram (LEXAPRO) 10 MG tablet    Sig: Take 1 tablet (10 mg total) by mouth daily. Needs appointment for further refills    Dispense:  90 tablet    Refill:  0    Please hold script. Please add to existing refills for  her. Thanks.   Referral Orders  No referral(s) requested today     Electronically signed by: Howard Pouch, Tina

## 2020-02-28 ENCOUNTER — Encounter: Payer: Self-pay | Admitting: Family Medicine

## 2020-07-06 ENCOUNTER — Telehealth: Payer: Self-pay | Admitting: Family Medicine

## 2020-07-06 DIAGNOSIS — R232 Flushing: Secondary | ICD-10-CM

## 2020-07-06 NOTE — Telephone Encounter (Signed)
LM for pt to returncall

## 2020-07-06 NOTE — Telephone Encounter (Signed)
  LAST APPOINTMENT DATE: 02/26/2020   NEXT APPOINTMENT DATE:@Visit  date not found  MEDICATION:celecoxib (CELEBREX) 100 MG capsule  PHARMACY:[id number] [place], [place] - [address]  COMMENTS: Patient is completely out, states [facility] has told her to contact us about this refill.    Please advise

## 2020-07-06 NOTE — Telephone Encounter (Signed)
Sent as FYI  Patient returned call and advised she would be due for [facility] appt soon. Offered to schedule, she declined but stated several times that she has been seen recently along with blood work. Advised she was last seen 02/26/20 and labs done at that time but PCP noted to f/u 5.5 months. She mentioned she would just call her orthopedist for refill if unable to be done. She was notified PCP out of the office until Thursday and certain medications are sent to provider for approval. She became upset and stated would just call ortho for refill and hung up.

## 2020-07-09 MED ORDER — ESCITALOPRAM OXALATE 10 MG PO TABS: 10.0000 mg | ORAL_TABLET | Freq: Every day | ORAL | 0 refills | Status: DC

## 2020-07-09 MED ORDER — CELECOXIB 100 MG PO CAPS: 100.0000 mg | ORAL_CAPSULE | Freq: Two times a day (BID) | ORAL | 0 refills | Status: DC

## 2020-07-09 NOTE — Telephone Encounter (Signed)
Patient returned call and advised of recommendations. °

## 2020-07-09 NOTE — Telephone Encounter (Signed)
Patient was supplied with 1 years worth of prescription at her appointment 09/18/2019.  And an additional prescription was written at her physical for an additional 3 months on to the years with prescription written 09/18/2019-to ensure she had plenty to get her to her 67-month follow-up.  I believe this must be a mixup at her pharmacy if they are stating she does not have these medications for refill or they are filed under a different prescription number than her original prescriptions.  She should have had enough medications to get her until the end of September (or longer) on both her Lexapro and Celebrex at her pharmacy.     I went ahead and refilled each of these 1 additional time for her.  Please instruct her to call her pharmacy and ask for the prescriptions by name and not call in the prescription number on the bottle.  She would be due for her 69-month follow-up before September 31.  Thanks

## 2020-07-09 NOTE — Addendum Note (Signed)
Addended by: Howard Pouch A on: 07/09/2020 03:36 PM   Modules accepted: Orders

## 2020-07-14 ENCOUNTER — Encounter: Payer: Self-pay | Admitting: Family Medicine

## 2020-08-27 ENCOUNTER — Other Ambulatory Visit: Payer: BC Managed Care – PPO

## 2020-08-27 DIAGNOSIS — Z20822 Contact with and (suspected) exposure to covid-19: Secondary | ICD-10-CM

## 2020-08-28 LAB — SARS-COV-2, NAA 2 DAY TAT

## 2020-08-28 LAB — NOVEL CORONAVIRUS, NAA: SARS-CoV-2, NAA: NOT DETECTED

## 2020-09-22 ENCOUNTER — Encounter: Payer: Self-pay | Admitting: Family Medicine

## 2020-09-22 ENCOUNTER — Ambulatory Visit: Payer: BC Managed Care – PPO | Admitting: Family Medicine

## 2020-09-22 ENCOUNTER — Other Ambulatory Visit: Payer: Self-pay

## 2020-09-22 VITALS — BP 112/69 | HR 57 | Temp 98.2°F | Ht 68.0 in | Wt 231.0 lb

## 2020-09-22 DIAGNOSIS — Z23 Encounter for immunization: Secondary | ICD-10-CM | POA: Diagnosis not present

## 2020-09-22 DIAGNOSIS — Z79899 Other long term (current) drug therapy: Secondary | ICD-10-CM

## 2020-09-22 DIAGNOSIS — R232 Flushing: Secondary | ICD-10-CM

## 2020-09-22 DIAGNOSIS — E669 Obesity, unspecified: Secondary | ICD-10-CM

## 2020-09-22 DIAGNOSIS — M5136 Other intervertebral disc degeneration, lumbar region: Secondary | ICD-10-CM

## 2020-09-22 DIAGNOSIS — R001 Bradycardia, unspecified: Secondary | ICD-10-CM

## 2020-09-22 DIAGNOSIS — M159 Polyosteoarthritis, unspecified: Secondary | ICD-10-CM | POA: Diagnosis not present

## 2020-09-22 DIAGNOSIS — E039 Hypothyroidism, unspecified: Secondary | ICD-10-CM

## 2020-09-22 DIAGNOSIS — M961 Postlaminectomy syndrome, not elsewhere classified: Secondary | ICD-10-CM

## 2020-09-22 LAB — BASIC METABOLIC PANEL
BUN: 14 mg/dL (ref 6–23)
CO2: 27 mEq/L (ref 19–32)
Calcium: 9.2 mg/dL (ref 8.4–10.5)
Chloride: 104 mEq/L (ref 96–112)
Creatinine, Ser: 0.76 mg/dL (ref 0.40–1.20)
GFR: 83.09 mL/min (ref >60.00)
Glucose, Bld: 104 mg/dL — ABNORMAL HIGH (ref 70–99)
Potassium: 4.7 mEq/L (ref 3.5–5.1)
Sodium: 139 mEq/L (ref 135–145)

## 2020-09-22 MED ORDER — ESCITALOPRAM OXALATE 10 MG PO TABS: 10.0000 mg | ORAL_TABLET | Freq: Every day | ORAL | 1 refills | Status: DC

## 2020-09-22 MED ORDER — CELECOXIB 100 MG PO CAPS: 100.0000 mg | ORAL_CAPSULE | Freq: Two times a day (BID) | ORAL | 1 refills | Status: DC

## 2020-09-22 MED ORDER — PREDNISONE 20 MG PO TABS: ORAL_TABLET | ORAL | 0 refills | Status: DC

## 2020-09-22 MED ORDER — LEVOTHYROXINE SODIUM 25 MCG PO TABS
25.0000 ug | ORAL_TABLET | Freq: Every day | ORAL | 1 refills | Status: DC
Start: 2020-09-22 — End: 2021-04-15

## 2020-09-22 NOTE — Progress Notes (Signed)
This visit occurred during the SARS-CoV-2 public health emergency.  Safety protocols were in place, including screening questions prior to the visit, additional usage of staff PPE, and extensive cleaning of exam room while observing appropriate contact time as indicated for disinfecting solutions.    Patient ID: Cynthia [name], female  DOB: 05/03/1956, 64 y.o.   MRN: [id number] Patient Care Team    Relationship Specialty Notifications Start End  Ma Hillock, DO PCP - General Family Medicine  07/14/15   Sanda Klein, MD PCP - Cardiology Cardiology Admissions 11/30/18   Rosana Berger, MD Consulting Physician Gastroenterology  06/29/15   Melina Schools, MD Consulting Physician Orthopedic Surgery  04/13/16     Chief Complaint  Patient presents with  . Follow-up    [facility]    Subjective:  Cynthia [name] is a 64 y.o.  Female  present for  Hypothyroidism: Patient reports compliance with levothyroxine 25 mcg daily.  Last TSH 11/12/2018 3.13. Hot flashes: Patient reports Lexapro 10 mg daily is working well, for her hot flashes.  Osteoarthritis/ DDD lumbar spine: She reports Celebrex is helpful for her arthritis.  Recent kidney function test is normal.   Depression screen [facility] 2/9 09/22/2020 02/26/2020 06/20/2018 08/16/2017 05/25/2016  Decreased Interest 0 0 0 0 0  Down, Depressed, Hopeless 0 0 0 0 0  PHQ - 2 Score 0 0 0 0 0   No flowsheet data found.  Immunization History  Administered Date(s) Administered  . Influenza Inj Mdck Quad Pf 09/07/2019  . Influenza,inj,Quad PF,6+ Mos 09/15/2018, 09/22/2020  . Influenza,inj,quad, With Preservative 09/07/2019  . Influenza-Unspecified 07/21/2017, 09/15/2018  . PFIZER SARS-COV-2 Vaccination 02/28/2020, 03/27/2020  . Tdap 02/07/2008, 03/07/2018  . Zoster Recombinat (Shingrix) 09/07/2019, 12/13/2019    Past Medical History:  Diagnosis Date  . Arthritis   . Cancer ([place])    skin on nose  . Cervical disc disease   . Cystocele   .  Heart murmur   . History of adenomatous polyp of colon 06/24/15  . History of rheumatic fever    childhood  . Hypothyroidism   . 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 surveillance  . Mitral valve prolapse   . PONV (postoperative nausea and vomiting)   . Primary osteoarthritis of right hip    Intraarticular steroid injection by Dr. Nelva Bush 01/2016  . Rectocele   . SA node dysfunction (HCC)    No Known Allergies Past Surgical History:  Procedure Laterality Date  . ABDOMINAL HYSTERECTOMY  05/1996   TVH--ovaries remain  . ABDOMINAL SACROCOLPOPEXY N/A 06/13/2017   Procedure: ABDOMINO SACROCOLPOPEXY with [place] culdoplasty;  Surgeon: Nunzio Cobbs, MD;  Location: [facility];  Service: Gynecology;  Laterality: N/A;  . ANTERIOR AND POSTERIOR REPAIR N/A 06/13/2017   Procedure: ANTERIOR (CYSTOCELE) AND POSTERIOR REPAIR (RECTOCELE);  Surgeon: Nunzio Cobbs, MD;  Location: [facility];  Service: Gynecology;  Laterality: N/A;  . arthroscopic knee Right   . BACK SURGERY  2012   disc rupture  . BILATERAL SALPINGECTOMY Bilateral 06/13/2017   Procedure: BILATERAL SALPINGECTOMY;  Surgeon: Nunzio Cobbs, MD;  Location: [facility];  Service: Gynecology;  Laterality: Bilateral;  . BLADDER SUSPENSION N/A 06/13/2017   Procedure: TRANSVAGINAL TAPE (TVT) PROCEDURE;  Surgeon: Nunzio Cobbs, MD;  Location: [facility];  Service: Gynecology;  Laterality: N/A;  . COLONOSCOPY W/ POLYPECTOMY  06/24/15   Recall 5 yrs (Dr. [facility])  .  CYSTOSCOPY N/A 06/13/2017   Procedure: CYSTOSCOPY;  Surgeon: Nunzio Cobbs, MD;  Location: [facility];  Service: Gynecology;  Laterality: N/A;  . KNEE RECONSTRUCTION Right 2008  . LIVER BIOPSY  1997   fatty tumors  . 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 Alcohol abuse Mother   . CVA Mother        Dec 65  . Heart disease Mother   . Cancer Father          prostate  . Arthritis Father   . Heart murmur Son   . Alcohol abuse Sister   . Heart disease Brother        congenital heart defect--Dec age 52 from MI  . Heart murmur Son   . Heart murmur Son   . Cancer Maternal Aunt 50       Dec colon CA age 12  . Cancer Cousin 18       Dec colon CA age 29   Social History   Social History Narrative   Ms. Sisney lives in [place] with her husband.  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 She is from [place].    Allergies as of 09/22/2020   No Known Allergies     Medication List       Accurate as of September 22, 2020  9:43 AM. If you have any questions, ask your nurse or doctor.        calcium gluconate 500 MG tablet Take 1 tablet by mouth daily.   celecoxib 100 MG capsule Commonly known as: CELEBREX Take 1 capsule (100 mg total) by mouth 2 (two) times daily.   escitalopram 10 MG tablet Commonly known as: LEXAPRO Take 1 tablet (10 mg total) by mouth daily. Needs appointment for further refills   estradiol 0.1 MG/GM vaginal cream Commonly known as: ESTRACE Use 1/2 g vaginally two or three times per week as needed to maintain symptom relief.   levothyroxine 25 MCG tablet Commonly known as: SYNTHROID Take 1 tablet (25 mcg total) by mouth daily before breakfast.   predniSONE 20 MG tablet Commonly known as: DELTASONE 60 mg x3d, 40 mg x3d, 20 mg x2d, 10 mg x2d Started by: Howard Pouch, DO   Vitamin D 125 MCG (5000 UT) Caps Take by mouth. Daily       All past medical history, surgical history, allergies, family history, immunizations andmedications were updated in the EMR today and reviewed under the history and medication portions of their EMR.       ROS: 14 pt review of systems performed and negative (unless mentioned in an HPI)  Objective: BP 112/69   Pulse (!) 57   Temp 98.2 F (36.8 C) (Oral)   Ht 5\' 8"  (1.727 m)   Wt 231 lb (104.8 kg)    LMP 03/28/1996 (Within Months)   SpO2 97%   BMI 35.12 kg/m  Gen: Afebrile. No acute distress. Nontoxic pleasant female.  HENT: AT. [place]. Bilateral TM visualized and normal in appearance, w/ the exception x2 small area of cerumen against TM right and x1 left. EAC normal and w/o cerumen.  Eyes:Pupils Equal Round Reactive to light, Extraocular movements intact,  Conjunctiva without redness, discharge or icterus. Neck/lymp/endocrine: Supple,no lymphadenopathy, no thyromegaly CV: RRR no murmur, no edema Chest: CTAB, no wheeze or crackles Skin: no rashes, purpura or petechiae.  Neuro:  Normal gait. PERLA. EOMi. Alert. Oriented x3 Psych: Normal affect, dress and demeanor. Normal speech. Normal thought content and judgment.   No exam data present  Assessment/plan: Cynthia [name] is a 64 y.o. female present for CPE  Vitamin D deficiency Continue vit d supplement.   AV junctional bradycardia/MVP (mitral valve prolapse) Continue to follow with cardiology as directed  Hypothyroidism, unspecified type Stable.  Continue  ensure appropriate dose.  Current dose levothyroxine 25 mcg daily  Vasomotor flushing/excessive sweating Stable.  Continue Lexapro 10 mg  Need for influenza vaccination Influenza vaccine administered today  Encounter for long-term current use of medication - chronic Celebrex use - Basic Metabolic Panel (BMET)   Osteoarthritis of multiple joints, unspecified osteoarthritis type/Degeneration of lumbar intervertebral disc/Lumbar post-laminectomy syndrome Stable  Continue Celebrex BID.  Prednisone taper prescribed to keep on hand in the event of flare. Pt understands appropriate use.    Return in about 5 months (around 02/26/2021) for CPE (30 min).  Orders Placed This Encounter  Procedures  . Flu Vaccine QUAD 6+ mos PF IM (Fluarix Quad PF)  . Basic Metabolic Panel (BMET)    Meds ordered this encounter  Medications  . celecoxib (CELEBREX) 100 MG capsule     Sig: Take 1 capsule (100 mg total) by mouth 2 (two) times daily.    Dispense:  180 capsule    Refill:  1  . escitalopram (LEXAPRO) 10 MG tablet    Sig: Take 1 tablet (10 mg total) by mouth daily. Needs appointment for further refills    Dispense:  90 tablet    Refill:  1  . predniSONE (DELTASONE) 20 MG tablet    Sig: 60 mg x3d, 40 mg x3d, 20 mg x2d, 10 mg x2d    Dispense:  18 tablet    Refill:  0  . levothyroxine (SYNTHROID) 25 MCG tablet    Sig: Take 1 tablet (25 mcg total) by mouth daily before breakfast.    Dispense:  90 tablet    Refill:  1    Please add to existing refills. thanks   Referral Orders  No referral(s) requested today     Electronically signed by: Howard Pouch, [place]

## 2020-09-22 NOTE — Patient Instructions (Signed)
Next appt in first week of April for physical and fasting labs (and chronic conditions).

## 2020-12-16 ENCOUNTER — Ambulatory Visit: Payer: BC Managed Care – PPO | Admitting: Obstetrics and Gynecology

## 2021-02-19 ENCOUNTER — Encounter: Payer: Self-pay | Admitting: Family Medicine

## 2021-02-19 ENCOUNTER — Telehealth (INDEPENDENT_AMBULATORY_CARE_PROVIDER_SITE_OTHER): Payer: BC Managed Care – PPO | Admitting: Family Medicine

## 2021-02-19 DIAGNOSIS — J329 Chronic sinusitis, unspecified: Secondary | ICD-10-CM

## 2021-02-19 DIAGNOSIS — B9689 Other specified bacterial agents as the cause of diseases classified elsewhere: Secondary | ICD-10-CM | POA: Diagnosis not present

## 2021-02-19 MED ORDER — PREDNISONE 50 MG PO TABS: 50.0000 mg | ORAL_TABLET | Freq: Every day | ORAL | 0 refills | Status: DC

## 2021-02-19 MED ORDER — CETIRIZINE HCL 10 MG PO TABS: 10.0000 mg | ORAL_TABLET | Freq: Every day | ORAL | 1 refills | Status: DC

## 2021-02-19 MED ORDER — DOXYCYCLINE HYCLATE 100 MG PO TABS: 100.0000 mg | ORAL_TABLET | Freq: Two times a day (BID) | ORAL | 0 refills | Status: DC

## 2021-02-19 NOTE — Progress Notes (Signed)
VIRTUAL VISIT VIA VIDEO  I connected with [name] on 02/19/21 at  4:00 PM EDT by elemedicine application and verified that I am speaking with the correct person using two identifiers. Location patient: Home Location provider: [facility], Office Persons participating in the virtual visit: Patient, Dr. Raoul Pitch and Darnell Level. Cesar, CMA  I discussed the limitations of evaluation and management by telemedicine and the availability of in person appointments. The patient expressed understanding and agreed to proceed.   SUBJECTIVE Chief Complaint  Patient presents with   Cough    Pt c/o productive cough with yellowish, runny nose, sinus pressure, jaw pain x 3 weeks with slight improvement     HPI: Cynthia [name] is a 65 y.o. female present for acute concern. She reports productive cough, runny nose and sinus pressure of 3 weeks duration.  She had taken a Covid test which was negative.  She states symptoms first started with a sore throat.  She denies fever, chills, nausea, vomit, shortness of breath or GI symptoms.  She is currently not taking an antihistamine. OTC: Nothing. covid vaccine x2  ROS: See pertinent positives and negatives per HPI.  Patient Active Problem List   Diagnosis Date Noted   AV junctional bradycardia 12/12/2019   Hypoglycemia 01/03/2019   Excessive sweating 11/13/2018   Hypothyroidism    Lumbar post-laminectomy syndrome 11/01/2018   Degeneration of lumbar intervertebral disc 11/01/2018   MVP (mitral valve prolapse) 03/14/2017   Family history of aortic aneurysm 03/14/2017   Osteoarthritis 05/25/2016   Obesity (BMI 30-39.9) 05/25/2016   Vitamin D deficiency 05/12/2015   History of colon polyps 05/27/2014   Vasomotor flushing 03/17/2014   Personal history of cardiac arrhythmia 03/17/2014    Social History   Tobacco Use   Smoking status: Never Smoker   Smokeless tobacco: Never Used  Substance Use Topics   Alcohol use: Yes     Alcohol/week: 4.0 standard drinks    Types: 4 Glasses of wine per week    Current Outpatient Medications:    calcium gluconate 500 MG tablet, Take 1 tablet by mouth daily. , Disp: , Rfl:    celecoxib (CELEBREX) 100 MG capsule, Take 1 capsule (100 mg total) by mouth 2 (two) times daily., Disp: 180 capsule, Rfl: 1   cetirizine (ZYRTEC) 10 MG tablet, Take 1 tablet (10 mg total) by mouth daily., Disp: 90 tablet, Rfl: 1   Cholecalciferol (VITAMIN D) 125 MCG (5000 UT) CAPS, Take by mouth. Daily, Disp: , Rfl:    doxycycline (VIBRA-TABS) 100 MG tablet, Take 1 tablet (100 mg total) by mouth 2 (two) times daily., Disp: 20 tablet, Rfl: 0   escitalopram (LEXAPRO) 10 MG tablet, Take 1 tablet (10 mg total) by mouth daily. Needs appointment for further refills, Disp: 90 tablet, Rfl: 1   estradiol (ESTRACE) 0.1 MG/GM vaginal cream, Use 1/2 g vaginally two or three times per week as needed to maintain symptom relief., Disp: 42.5 g, Rfl: 1   levothyroxine (SYNTHROID) 25 MCG tablet, Take 1 tablet (25 mcg total) by mouth daily before breakfast., Disp: 90 tablet, Rfl: 1   predniSONE (DELTASONE) 50 MG tablet, Take 1 tablet (50 mg total) by mouth daily with breakfast., Disp: 5 tablet, Rfl: 0   prednisoLONE acetate (PRED FORTE) 1 % ophthalmic suspension, SMARTSIG:4 Drop(s) In Ear(s) Twice Daily PRN, Disp: , Rfl:   No Known Allergies  OBJECTIVE: LMP 03/28/1996 (Within Months)  Gen: No acute distress. Nontoxic in appearance.  Nasal congestion present HENT:  AT. [place].  MMM.  Eyes:Pupils Equal Round Reactive to light, Extraocular movements intact,  Conjunctiva without redness, discharge or icterus. Chest: Cough present-mild Neuro:  Normal gait. Alert. Oriented x3   ASSESSMENT AND PLAN: CHARLOTTE FIDALGO is a 65 y.o. female present for  Bacterial sinusitis Rest, hydrate.  Start flonase, mucinex (DM if cough) Doxycycline twice daily x10 days and prednisone x5 days prescribed, take until completed.   Start Zyrtec nightly F/U 2 weeks if not improved.    Howard Pouch, DO 02/19/2021    No orders of the defined types were placed in this encounter.  Meds ordered this encounter  Medications   doxycycline (VIBRA-TABS) 100 MG tablet    Sig: Take 1 tablet (100 mg total) by mouth 2 (two) times daily.    Dispense:  20 tablet    Refill:  0   cetirizine (ZYRTEC) 10 MG tablet    Sig: Take 1 tablet (10 mg total) by mouth daily.    Dispense:  90 tablet    Refill:  1   predniSONE (DELTASONE) 50 MG tablet    Sig: Take 1 tablet (50 mg total) by mouth daily with breakfast.    Dispense:  5 tablet    Refill:  0   Referral Orders  No referral(s) requested today

## 2021-02-19 NOTE — Patient Instructions (Signed)

## 2021-02-26 ENCOUNTER — Telehealth: Payer: Self-pay | Admitting: Family Medicine

## 2021-02-26 NOTE — Telephone Encounter (Signed)
Received faxed preoperative risk evaluation form from [email or web address]. Placed in Dr. Lucita Lora front office inbox to be signed.

## 2021-03-01 NOTE — Telephone Encounter (Signed)
Called patient, left message to call for appointment.

## 2021-03-01 NOTE — Telephone Encounter (Signed)
Will place form on providers desk.   Request for surgical clearance was received today from [facility].  All fields must be completed.If fields unknown, surgical office must be contacted. What type of surgery is being performed? L total hip arthroplasty  What type of anesthesia? Spinal When is this surgery scheduled? 05/03/21  Name of physician performing surgery? Dr. Kristeen Mans  Patient has been called and appt for surgical clearance has been scheduled: Yes; VM left see message below  Cynthia [name]

## 2021-03-01 NOTE — Telephone Encounter (Addendum)
Form received but surgical clearance appt needed. Please assist patient with scheduling, thanks.

## 2021-03-04 ENCOUNTER — Telehealth: Payer: Self-pay

## 2021-03-04 NOTE — Telephone Encounter (Signed)
Spoke with pt with providers instructions pt will make an appointment once receive results.

## 2021-03-04 NOTE — Telephone Encounter (Signed)
May have an office appointment if the Covid PCR she is getting today is negative.

## 2021-03-04 NOTE — Telephone Encounter (Signed)
Pt had appt 02/19/21 for bacteria sinusitis and is not feeling like she is getting any better. She states that she is not getting any worse either but is not sure what she can do since it has been about a month of symptoms. Pt is getting tested for COVID (PCR) today, has been in contact with COVID+ person in the last 5 days. Pt agrees to have appt but wonders if she should do in office visit due to duration of symptoms.

## 2021-03-05 ENCOUNTER — Telehealth (INDEPENDENT_AMBULATORY_CARE_PROVIDER_SITE_OTHER): Payer: BC Managed Care – PPO | Admitting: Family Medicine

## 2021-03-05 ENCOUNTER — Encounter: Payer: Self-pay | Admitting: Family Medicine

## 2021-03-05 DIAGNOSIS — J209 Acute bronchitis, unspecified: Secondary | ICD-10-CM

## 2021-03-05 MED ORDER — AZITHROMYCIN 250 MG PO TABS: ORAL_TABLET | ORAL | 0 refills | Status: DC

## 2021-03-05 MED ORDER — PREDNISONE 20 MG PO TABS: ORAL_TABLET | ORAL | 0 refills | Status: DC

## 2021-03-05 MED ORDER — CETIRIZINE HCL 10 MG PO TABS
10.0000 mg | ORAL_TABLET | Freq: Every day | ORAL | 1 refills | Status: DC
Start: 2021-03-05 — End: 2021-04-14

## 2021-03-05 MED ORDER — ALBUTEROL SULFATE HFA 108 (90 BASE) MCG/ACT IN AERS
2.0000 | INHALATION_SPRAY | Freq: Four times a day (QID) | RESPIRATORY_TRACT | 0 refills | Status: DC | PRN
Start: 2021-03-05 — End: 2021-12-13

## 2021-03-05 NOTE — Progress Notes (Signed)
VIRTUAL VISIT VIA VIDEO  I connected with Cynthia [name] on 03/05/21 at  4:00 PM EDT by elemedicine application and verified that I am speaking with the correct person using two identifiers. Location patient: Home Location provider: [facility], Office Persons participating in the virtual visit: Patient, Dr. Raoul Pitch and Darnell Level. Cynthia [name], CMA  I discussed the limitations of evaluation and management by telemedicine and the availability of in person appointments. The patient expressed understanding and agreed to proceed.   SUBJECTIVE Chief Complaint  Patient presents with  . Cough    Pt c/o productive cough in mornings, SOB, x 1.5 mos; with mild improvement from last improvement    HPI: Cynthia [name] is a 65 y.o. female present for persistent sinusitis symptoms. Symptoms present since beginning of March. SHe was treated 2 weeks ago with doxy BID 10 day course and prednisone.  She had been taking the Zyrtec during this time, but recently ran out and did not restart.  She reports feeling mildly improved over the time she was on doxycycline and prednisone, but since stopping medication she has not seen any improvement and she remains ill.  She feels like is not breathing deeply and has to take in a deep breath.  She feels mildly winded.  She denies any fever, chills, nausea, vomit, dizziness, weakness or heart racing.  States the cough has greatly improved. He does not have a history of RAD, asthma or COPD.  She does recall having to use an inhaler once a long time ago when she was ill.  ROS: See pertinent positives and negatives per HPI.  Patient Active Problem List   Diagnosis Date Noted  . AV junctional bradycardia 12/12/2019  . Hypoglycemia 01/03/2019  . Excessive sweating 11/13/2018  . Hypothyroidism   . Lumbar post-laminectomy syndrome 11/01/2018  . Degeneration of lumbar intervertebral disc 11/01/2018  . MVP (mitral valve prolapse) 03/14/2017  . Family history of aortic  aneurysm 03/14/2017  . Osteoarthritis 05/25/2016  . Obesity (BMI 30-39.9) 05/25/2016  . Vitamin D deficiency 05/12/2015  . History of colon polyps 05/27/2014  . Vasomotor flushing 03/17/2014  . Personal history of cardiac arrhythmia 03/17/2014    Social History   Tobacco Use  . Smoking status: Never Smoker  . Smokeless tobacco: Never Used  Substance Use Topics  . Alcohol use: Yes    Alcohol/week: 4.0 standard drinks    Types: 4 Glasses of wine per week    Current Outpatient Medications:  .  albuterol (VENTOLIN HFA) 108 (90 Base) MCG/ACT inhaler, Inhale 2 puffs into the lungs every 6 (six) hours as needed for wheezing or shortness of breath., Disp: 8 g, Rfl: 0 .  azithromycin (ZITHROMAX) 250 MG tablet, 500 mg day 1, then 250 mg QD, Disp: 6 tablet, Rfl: 0 .  calcium gluconate 500 MG tablet, Take 1 tablet by mouth daily. , Disp: , Rfl:  .  celecoxib (CELEBREX) 100 MG capsule, Take 1 capsule (100 mg total) by mouth 2 (two) times daily., Disp: 180 capsule, Rfl: 1 .  Cholecalciferol (VITAMIN D) 125 MCG (5000 UT) CAPS, Take by mouth. Daily, Disp: , Rfl:  .  escitalopram (LEXAPRO) 10 MG tablet, Take 1 tablet (10 mg total) by mouth daily. Needs appointment for further refills, Disp: 90 tablet, Rfl: 1 .  estradiol (ESTRACE) 0.1 MG/GM vaginal cream, Use 1/2 g vaginally two or three times per week as needed to maintain symptom relief., Disp: 42.5 g, Rfl: 1 .  levothyroxine (SYNTHROID) 25 MCG  tablet, Take 1 tablet (25 mcg total) by mouth daily before breakfast., Disp: 90 tablet, Rfl: 1 .  prednisoLONE acetate (PRED FORTE) 1 % ophthalmic suspension, SMARTSIG:4 Drop(s) In Ear(s) Twice Daily PRN, Disp: , Rfl:  .  predniSONE (DELTASONE) 20 MG tablet, 60 mg x3d, 40 mg x3d, 20 mg x2d, 10 mg x2d, Disp: 18 tablet, Rfl: 0 .  cetirizine (ZYRTEC) 10 MG tablet, Take 1 tablet (10 mg total) by mouth daily., Disp: 90 tablet, Rfl: 1  No Known Allergies  OBJECTIVE: LMP 03/28/1996 (Within Months)  Gen: No acute  distress. Nontoxic in appearance.  HENT: AT. [place].  MMM.  Eyes:Pupils Equal Round Reactive to light, Extraocular movements intact,  Conjunctiva without redness, discharge or icterus. Chest: Cough not present on exam Skin: No rashes, purpura or petechiae.  Neuro:  Normal gait. Alert. Oriented x3  Psych: Normal affect and demeanor. Normal speech. Normal thought content and judgment.  ASSESSMENT AND PLAN: [name] is a 65 y.o. female present for  Persistent bronchitis Patient has symptoms of extended course of bronchitis-like features.  Discussed options for treatment today. Elected to treat with azithromycin, prednisone taper and albuterol inhaler 1 to 2 puffs every 4-6 hours as needed as needed. Restart Zyrtec If symptoms do not continuously improve over the next week she is to call in to be seen in person and likely have a chest x-ray.     Howard Pouch, DO 03/05/2021   Return if symptoms worsen or fail to improve.  No orders of the defined types were placed in this encounter.  Meds ordered this encounter  Medications  . cetirizine (ZYRTEC) 10 MG tablet    Sig: Take 1 tablet (10 mg total) by mouth daily.    Dispense:  90 tablet    Refill:  1  . azithromycin (ZITHROMAX) 250 MG tablet    Sig: 500 mg day 1, then 250 mg QD    Dispense:  6 tablet    Refill:  0  . predniSONE (DELTASONE) 20 MG tablet    Sig: 60 mg x3d, 40 mg x3d, 20 mg x2d, 10 mg x2d    Dispense:  18 tablet    Refill:  0  . albuterol (VENTOLIN HFA) 108 (90 Base) MCG/ACT inhaler    Sig: Inhale 2 puffs into the lungs every 6 (six) hours as needed for wheezing or shortness of breath.    Dispense:  8 g    Refill:  0   Referral Orders  No referral(s) requested today

## 2021-03-08 ENCOUNTER — Encounter: Payer: Self-pay | Admitting: Family Medicine

## 2021-03-16 ENCOUNTER — Other Ambulatory Visit: Payer: Self-pay | Admitting: Family Medicine

## 2021-03-16 DIAGNOSIS — R232 Flushing: Secondary | ICD-10-CM

## 2021-03-31 ENCOUNTER — Other Ambulatory Visit: Payer: Self-pay | Admitting: Family Medicine

## 2021-04-08 ENCOUNTER — Telehealth: Payer: Self-pay

## 2021-04-08 NOTE — Telephone Encounter (Signed)
Pt already has appt 5/16 provider has original form on desk

## 2021-04-08 NOTE — Telephone Encounter (Signed)
Surgical clearance forms received on 04/08/21. Patient has been scheduled on 04/12/21 to surgical clearance appt. Forms have been placed with CMA

## 2021-04-12 ENCOUNTER — Ambulatory Visit: Payer: BC Managed Care – PPO | Admitting: Family Medicine

## 2021-04-13 ENCOUNTER — Other Ambulatory Visit: Payer: Self-pay | Admitting: Family Medicine

## 2021-04-13 DIAGNOSIS — R232 Flushing: Secondary | ICD-10-CM

## 2021-04-14 ENCOUNTER — Other Ambulatory Visit: Payer: Self-pay

## 2021-04-14 ENCOUNTER — Ambulatory Visit: Payer: BC Managed Care – PPO | Admitting: Family Medicine

## 2021-04-14 ENCOUNTER — Encounter: Payer: Self-pay | Admitting: Family Medicine

## 2021-04-14 VITALS — BP 117/70 | HR 70 | Temp 98.2°F | Ht 68.0 in | Wt 230.0 lb

## 2021-04-14 DIAGNOSIS — E039 Hypothyroidism, unspecified: Secondary | ICD-10-CM | POA: Diagnosis not present

## 2021-04-14 DIAGNOSIS — R232 Flushing: Secondary | ICD-10-CM | POA: Diagnosis not present

## 2021-04-14 DIAGNOSIS — Z01818 Encounter for other preprocedural examination: Secondary | ICD-10-CM | POA: Diagnosis not present

## 2021-04-14 DIAGNOSIS — Z130 Encounter for screening for diseases of the blood and blood-forming organs and certain disorders involving the immune mechanism: Secondary | ICD-10-CM

## 2021-04-14 DIAGNOSIS — Z131 Encounter for screening for diabetes mellitus: Secondary | ICD-10-CM | POA: Diagnosis not present

## 2021-04-14 DIAGNOSIS — E669 Obesity, unspecified: Secondary | ICD-10-CM

## 2021-04-14 DIAGNOSIS — M159 Polyosteoarthritis, unspecified: Secondary | ICD-10-CM

## 2021-04-14 MED ORDER — ESCITALOPRAM OXALATE 10 MG PO TABS: 10.0000 mg | ORAL_TABLET | Freq: Every day | ORAL | 1 refills | Status: DC

## 2021-04-14 MED ORDER — CELECOXIB 100 MG PO CAPS: 100.0000 mg | ORAL_CAPSULE | Freq: Two times a day (BID) | ORAL | 1 refills | Status: DC

## 2021-04-14 NOTE — Progress Notes (Signed)
This visit occurred during the SARS-CoV-2 public health emergency.  Safety protocols were in place, including screening questions prior to the visit, additional usage of staff PPE, and extensive cleaning of exam room while observing appropriate contact time as indicated for disinfecting solutions.    Cynthia [name] , 27-Oct-1956, 65 y.o., [name] MRN: [id number] Patient Care Team    Relationship Specialty Notifications Start End  Ma Hillock, DO PCP - General Family Medicine  07/14/15   Sanda Klein, MD PCP - Cardiology Cardiology Admissions 11/30/18   Rosana Berger, MD Consulting Physician Gastroenterology  06/29/15   Melina Schools, MD Consulting Physician Orthopedic Surgery  04/13/16     Chief Complaint  Patient presents with  . surgical clearance      Subjective: Cynthia [name] is a 65 y.o. [name] present for surgical clearance Procedure: Left total hip arthroplasty- Dr. Paralee Cancel, May 03, 2021 Indication: Hip pain/arthritis Anesthesia: Spinal  Surgery type risk:   - Intermediate risk= orthopedics Prior anesthesia complications: Postop nausea and vomiting Family history of prior anesthesia complications: No Cardiac: No prior history of MI.  No CHF   - METs:.  Greater than 4 METS Pulmonary: No pulmonary disease Endocrine: Negative for diabetes.  Hypothyroidism well-controlled. Obesity:Body mass index is 34.97 kg/m. Chronic kidney disease: No evidence of kidney disease. Chronic med that needs to be continued:synthroid Anticoagulation: celebrex and vitamins to stop 1-2 weeks prior and upon surgery recommendations   Depression screen [facility] 2/9 09/22/2020 02/26/2020 06/20/2018 08/16/2017 05/25/2016  Decreased Interest 0 0 0 0 0  Down, Depressed, Hopeless 0 0 0 0 0  PHQ - 2 Score 0 0 0 0 0    No Known Allergies Social History   Social History Narrative   Cynthia [name] lives in [place] with her husband.  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 She has 3 grown sons. She is from [place].   Past Medical History:  Diagnosis Date  . Arthritis   . Cancer ([place])    skin on nose  . Cervical disc disease   . Cystocele   . Heart murmur   . History of adenomatous polyp of colon 06/24/15  . History of rheumatic fever    childhood  . Hypothyroidism   . 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 surveillance  . Mitral valve prolapse   . PONV (postoperative nausea and vomiting)   . Primary osteoarthritis of right hip    Intraarticular steroid injection by Dr. Nelva Bush 01/2016  . Rectocele   . SA node dysfunction [facility])    Past Surgical History:  Procedure Laterality Date  . ABDOMINAL HYSTERECTOMY  05/1996   TVH--ovaries remain  . ABDOMINAL SACROCOLPOPEXY N/A 06/13/2017   Procedure: ABDOMINO SACROCOLPOPEXY with Hal[place] culdoplasty;  Surgeon: Nunzio Cobbs, MD;  Location: [facility];  Service: Gynecology;  Laterality: N/A;  . ANTERIOR AND POSTERIOR REPAIR N/A 06/13/2017   Procedure: ANTERIOR (CYSTOCELE) AND POSTERIOR REPAIR (RECTOCELE);  Surgeon: Nunzio Cobbs, MD;  Location: [facility];  Service: Gynecology;  Laterality: N/A;  . arthroscopic knee Right   . BACK SURGERY  2012   disc rupture  . BILATERAL SALPINGECTOMY Bilateral 06/13/2017   Procedure: BILATERAL SALPINGECTOMY;  Surgeon: Nunzio Cobbs, MD;  Location: [facility];  Service: Gynecology;  Laterality: Bilateral;  . BLADDER SUSPENSION N/A 06/13/2017   Procedure: TRANSVAGINAL TAPE (TVT)  PROCEDURE;  Surgeon: Nunzio Cobbs, MD;  Location: [facility];  Service: Gynecology;  Laterality: N/A;  . COLONOSCOPY W/ POLYPECTOMY  06/24/15   Recall 5 yrs ([facility])  . CYSTOSCOPY N/A 06/13/2017   Procedure: CYSTOSCOPY;  Surgeon: Nunzio Cobbs, MD;  Location: [facility];  Service: Gynecology;  Laterality: N/A;  . KNEE RECONSTRUCTION Right 2008  . LIVER BIOPSY  1997   fatty  tumors  . 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 Alcohol abuse Mother   . CVA Mother        Dec 65  . Heart disease Mother   . Cancer Father        prostate  . Arthritis Father   . Heart murmur Son   . Alcohol abuse Sister   . Heart disease Brother        congenital heart defect--Dec age 45 from MI  . Heart murmur Son   . Heart murmur Son   . Cancer Maternal Aunt 50       Dec colon CA age 71  . Cancer Cousin 71       Dec colon CA age 13   Allergies as of 04/14/2021   No Known Allergies     Medication List       Accurate as of Apr 14, 2021 11:59 PM. If you have any questions, ask your nurse or doctor.        STOP taking these medications   aspirin 81 MG chewable tablet Stopped by: Howard Pouch, DO   azithromycin 250 MG tablet Commonly known as: ZITHROMAX Stopped by: Howard Pouch, DO   cetirizine 10 MG tablet Commonly known as: ZYRTEC Stopped by: Howard Pouch, DO   docusate sodium 100 MG capsule Commonly known as: COLACE Stopped by: Howard Pouch, DO   methocarbamol 500 MG tablet Commonly known as: ROBAXIN Stopped by: Howard Pouch, DO   prednisoLONE acetate 1 % ophthalmic suspension Commonly known as: PRED FORTE Stopped by: Howard Pouch, DO   predniSONE 20 MG tablet Commonly known as: DELTASONE Stopped by: Howard Pouch, DO     TAKE these medications   albuterol 108 (90 Base) MCG/ACT inhaler Commonly known as: VENTOLIN HFA Inhale 2 puffs into the lungs every 6 (six) hours as needed for wheezing or shortness of breath.   calcium gluconate 500 MG tablet Take 1 tablet by mouth daily.   celecoxib 100 MG capsule Commonly known as: CELEBREX Take 1 capsule (100 mg total) by mouth 2 (two) times daily.   escitalopram 10 MG tablet Commonly known as: LEXAPRO Take 1 tablet (10 mg total) by mouth daily. Needs appointment for further refills   estradiol 0.1 MG/GM vaginal cream Commonly known as: ESTRACE Use  1/2 g vaginally two or three times per week as needed to maintain symptom relief.   levothyroxine 25 MCG tablet Commonly known as: SYNTHROID Take 1 tablet (25 mcg total) by mouth daily before breakfast.   Vitamin D 125 MCG (5000 UT) Caps Take by mouth. Daily       All past medical history, surgical history, allergies, family history, immunizations andmedications were updated in the EMR today and reviewed under the history and medication portions of their EMR.     ROS: Negative, with the exception of above mentioned in HPI   Objective:  BP 117/70   Pulse 70   Temp 98.2 F (36.8 C) (Oral)   Ht  5\' 8"  (1.727 m)   Wt 230 lb (104.3 kg)   LMP 03/28/1996 (Within Months)   SpO2 97%   BMI 34.97 kg/m  Body mass index is 34.97 kg/m. Gen: Afebrile. No acute distress. Nontoxic in appearance, well developed, well nourished.  HENT: AT. [place]. Bilateral TM visualized within normal limits. MMM, no oral lesions. Bilateral nares without erythema, drainage or swelling. Throat without erythema or exudates.  No cough.  No hoarseness. Eyes:Pupils Equal Round Reactive to light, Extraocular movements intact,  Conjunctiva without redness, discharge or icterus. Neck/lymp/endocrine: Supple, no lymphadenopathy, no thyromegaly CV: RRR no murmur, no edema Chest: CTAB, no wheeze or crackles. Good air movement, normal resp effort.  Abd: Soft. NTND. BS present.  Skin: no rashes, purpura or petechiae.  Neuro: Normal gait. PERLA. EOMi. Alert. Oriented x3 Psych: Normal affect, dress and demeanor. Normal speech. Normal thought content and judgment.  No exam data present No results found. Results for orders placed or performed in visit on 04/14/21 (from the past 24 hour(s))  TSH     Status: None   Collection Time: 04/14/21  4:04 PM  Result Value Ref Range   TSH 2.72 0.40 - 4.50 mIU/L  Hemoglobin A1c     Status: Abnormal   Collection Time: 04/14/21  4:04 PM  Result Value Ref Range   Hgb A1c MFr Bld 5.7 (H)  <5.7 % of total Hgb   Mean Plasma Glucose 117 mg/dL   eAG (mmol/L) 6.5 mmol/L  CBC     Status: None   Collection Time: 04/14/21  4:04 PM  Result Value Ref Range   WBC 7.9 3.8 - 10.8 Thousand/uL   RBC 4.48 3.80 - 5.10 Million/uL   Hemoglobin 13.3 11.7 - 15.5 g/dL   HCT 39.8 35.0 - 45.0 %   MCV 88.8 80.0 - 100.0 fL   MCH 29.7 27.0 - 33.0 pg   MCHC 33.4 32.0 - 36.0 g/dL   RDW 13.6 11.0 - 15.0 %   Platelets 284 140 - 400 Thousand/uL   MPV 9.4 7.5 - 12.5 fL    Assessment/Plan: Cynthia [name] is a 65 y.o. [name] present for OV for trickle clearance/CMC visit Vasomotor flushing Stable.  Continue Lexapro 10 mg daily Diabetes mellitus screening - Hemoglobin T6R - Basic Metabolic Panel (BMET) Screening for deficiency anemia - CBC Hypothyroidism, unspecified type Continue levothyroxine 25 mcg daily. - TSH Osteoarthritis of multiple joints, unspecified osteoarthritis type Continue Celebrex.  Patient aware to stop 1 to 2 weeks prior to surgery per surgeon's preference.  Obesity (BMI 30-39.9)  Pre-operative clearance Patient is at low risk of surgical complications based on her past medical history.  Surgical clearance form completed and faxed to orthopedic team. No patient is free of risk when undergoing a procedure. The decision about whether to proceed with the operation belongs to the surgeon and the patient.   Reviewed expectations re: course of current medical issues.  Discussed self-management of symptoms.  Outlined signs and symptoms indicating need for more acute intervention.  Patient verbalized understanding and all questions were answered.  Patient received an After-Visit Summary.    Orders Placed This Encounter  Procedures  . TSH  . Hemoglobin A1c  . CBC  . Basic Metabolic Panel (BMET)   Meds ordered this encounter  Medications  . escitalopram (LEXAPRO) 10 MG tablet    Sig: Take 1 tablet (10 mg total) by mouth daily. Needs appointment for further  refills    Dispense:  90 tablet  Refill:  1  . celecoxib (CELEBREX) 100 MG capsule    Sig: Take 1 capsule (100 mg total) by mouth 2 (two) times daily.    Dispense:  180 capsule    Refill:  1  . levothyroxine (SYNTHROID) 25 MCG tablet    Sig: Take 1 tablet (25 mcg total) by mouth daily before breakfast.    Dispense:  90 tablet    Refill:  3    Please add to existing refills. thanks   Referral Orders  No referral(s) requested today     Note is dictated utilizing voice recognition software. Although note has been proof read prior to signing, occasional typographical errors still can be missed. If any questions arise, please do not hesitate to call for verification.   electronically signed by:  Howard Pouch, DO  [place]

## 2021-04-14 NOTE — Patient Instructions (Signed)
  Great to see you today.  I have refilled the medication(s) we provide.   If labs were collected, we will inform you of lab results once received either by echart message or telephone call.   - echart message- for normal results that have been seen by the patient already.   - telephone call: abnormal results or if patient has not viewed results in their echart.   Good luck!!!!

## 2021-04-15 ENCOUNTER — Telehealth: Payer: Self-pay | Admitting: Family Medicine

## 2021-04-15 LAB — BASIC METABOLIC PANEL
BUN: 19 mg/dL (ref 7–25)
CO2: 22 mmol/L (ref 20–32)
Calcium: 9.4 mg/dL (ref 8.6–10.4)
Chloride: 105 mmol/L (ref 98–110)
Creat: 0.88 mg/dL (ref 0.50–0.99)
Glucose, Bld: 116 mg/dL — ABNORMAL HIGH (ref 65–99)
Potassium: 4.2 mmol/L (ref 3.5–5.3)
Sodium: 139 mmol/L (ref 135–146)

## 2021-04-15 LAB — CBC
HCT: 39.8 % (ref 35.0–45.0)
Hemoglobin: 13.3 g/dL (ref 11.7–15.5)
MCH: 29.7 pg (ref 27.0–33.0)
MCHC: 33.4 g/dL (ref 32.0–36.0)
MCV: 88.8 fL (ref 80.0–100.0)
MPV: 9.4 fL (ref 7.5–12.5)
Platelets: 284 10*3/uL (ref 140–400)
RBC: 4.48 10*6/uL (ref 3.80–5.10)
RDW: 13.6 % (ref 11.0–15.0)
WBC: 7.9 10*3/uL (ref 3.8–10.8)

## 2021-04-15 LAB — HEMOGLOBIN A1C
Hgb A1c MFr Bld: 5.7 % of total Hgb — ABNORMAL HIGH (ref <5.7)
Mean Plasma Glucose: 117 mg/dL
eAG (mmol/L): 6.5 mmol/L

## 2021-04-15 LAB — TSH: TSH: 2.72 mIU/L (ref 0.40–4.50)

## 2021-04-15 MED ORDER — LEVOTHYROXINE SODIUM 25 MCG PO TABS: 25.0000 ug | ORAL_TABLET | Freq: Every day | ORAL | 3 refills | Status: DC

## 2021-04-15 NOTE — Telephone Encounter (Signed)
OV notes printed 

## 2021-04-15 NOTE — Telephone Encounter (Signed)
Surgical clearance forms completed and placed in [place] work basket

## 2021-04-15 NOTE — Telephone Encounter (Signed)
Shred[place] notes. OV notes not completed

## 2021-04-15 NOTE — Telephone Encounter (Signed)
Form faxed

## 2021-05-14 DIAGNOSIS — Z96642 Presence of left artificial hip joint: Secondary | ICD-10-CM

## 2021-05-14 HISTORY — DX: Presence of left artificial hip joint: Z96.642

## 2021-07-13 ENCOUNTER — Other Ambulatory Visit: Payer: Self-pay

## 2021-10-10 ENCOUNTER — Other Ambulatory Visit: Payer: Self-pay | Admitting: Family Medicine

## 2021-10-19 ENCOUNTER — Other Ambulatory Visit: Payer: Self-pay | Admitting: Family Medicine

## 2021-10-19 DIAGNOSIS — R232 Flushing: Secondary | ICD-10-CM

## 2021-11-11 ENCOUNTER — Other Ambulatory Visit: Payer: Self-pay | Admitting: Family Medicine

## 2021-11-12 ENCOUNTER — Telehealth: Payer: Self-pay

## 2021-11-12 MED ORDER — CELECOXIB 100 MG PO CAPS: 100.0000 mg | ORAL_CAPSULE | Freq: Two times a day (BID) | ORAL | 0 refills | Status: DC

## 2021-11-12 NOTE — Telephone Encounter (Signed)
Patient refill request.  Scheduled first available appt with Dr. Raoul Pitch on 12/13/21.    [id number] - [place], [place] - [address]   celecoxib (CELEBREX) 100 MG capsule [[id number]]

## 2021-11-12 NOTE — Telephone Encounter (Signed)
Rx sent 

## 2021-11-14 ENCOUNTER — Other Ambulatory Visit: Payer: Self-pay | Admitting: Family Medicine

## 2021-11-14 DIAGNOSIS — R232 Flushing: Secondary | ICD-10-CM

## 2021-12-10 ENCOUNTER — Other Ambulatory Visit: Payer: Self-pay | Admitting: Family Medicine

## 2021-12-13 ENCOUNTER — Ambulatory Visit: Payer: BC Managed Care – PPO | Admitting: Family Medicine

## 2021-12-13 ENCOUNTER — Other Ambulatory Visit: Payer: Self-pay

## 2021-12-13 ENCOUNTER — Encounter: Payer: Self-pay | Admitting: Family Medicine

## 2021-12-13 VITALS — BP 135/78 | HR 59 | Temp 97.8°F | Wt 227.2 lb

## 2021-12-13 DIAGNOSIS — R232 Flushing: Secondary | ICD-10-CM | POA: Diagnosis not present

## 2021-12-13 DIAGNOSIS — M5136 Other intervertebral disc degeneration, lumbar region: Secondary | ICD-10-CM

## 2021-12-13 DIAGNOSIS — M961 Postlaminectomy syndrome, not elsewhere classified: Secondary | ICD-10-CM

## 2021-12-13 DIAGNOSIS — M159 Polyosteoarthritis, unspecified: Secondary | ICD-10-CM

## 2021-12-13 DIAGNOSIS — E039 Hypothyroidism, unspecified: Secondary | ICD-10-CM

## 2021-12-13 DIAGNOSIS — Z8249 Family history of ischemic heart disease and other diseases of the circulatory system: Secondary | ICD-10-CM

## 2021-12-13 MED ORDER — CELECOXIB 100 MG PO CAPS: 100.0000 mg | ORAL_CAPSULE | Freq: Two times a day (BID) | ORAL | 1 refills | Status: DC

## 2021-12-13 MED ORDER — ESCITALOPRAM OXALATE 10 MG PO TABS: 10.0000 mg | ORAL_TABLET | Freq: Every day | ORAL | 1 refills | Status: DC

## 2021-12-13 MED ORDER — LEVOTHYROXINE SODIUM 25 MCG PO TABS: 25.0000 ug | ORAL_TABLET | Freq: Every day | ORAL | 3 refills | Status: DC

## 2021-12-13 NOTE — Patient Instructions (Signed)
°  Great to see you today.  I have refilled the medication(s) we provide.   If labs were collected, we will inform you of lab results once received either by echart message or telephone call.   - echart message- for normal results that have been seen by the patient already.   - telephone call: abnormal results or if patient has not viewed results in their echart.  Next appt 5.5 months

## 2021-12-13 NOTE — Progress Notes (Signed)
This visit occurred during the SARS-CoV-2 public health emergency.  Safety protocols were in place, including screening questions prior to the visit, additional usage of staff PPE, and extensive cleaning of exam room while observing appropriate contact time as indicated for disinfecting solutions.    Cynthia [name] , 06-Mar-1956, 66 y.o., female MRN: [id number] Patient Care Team    Relationship Specialty Notifications Start End  Ma Hillock, DO PCP - General Family Medicine  07/14/15   Sanda Klein, MD PCP - Cardiology Cardiology Admissions 11/30/18   Rosana Berger, MD Consulting Physician Gastroenterology  06/29/15   Melina Schools, MD Consulting Physician Orthopedic Surgery  04/13/16     Chief Complaint  Patient presents with   Medication Refill     Subjective: Cynthia [name] is a 66 y.o. female present for [facility] Vasomotor flushing Stable with the use of lexapro 10 mg qd.   Osteoarthritis of multiple joints, unspecified osteoarthritis type/Lumbar post-laminectomy syndrome/Degeneration of lumbar intervertebral disc Stable with the use of celebrex BID. BMP < 6 mos.   Hypothyroidism, unspecified type Has been stable with levo 25 mcg qd on an empty stomach.   Depression screen [facility] 2/9 12/13/2021 09/22/2020 02/26/2020 06/20/2018 08/16/2017  Decreased Interest 0 0 0 0 0  Down, Depressed, Hopeless 0 0 0 0 0  PHQ - 2 Score 0 0 0 0 0  Altered sleeping 0 - - - -  Tired, decreased energy 0 - - - -  Change in appetite 0 - - - -  Feeling bad or failure about yourself  0 - - - -  Trouble concentrating 0 - - - -  Moving slowly or fidgety/restless 0 - - - -  Suicidal thoughts 0 - - - -  PHQ-9 Score 0 - - - -    No Known Allergies Social History   Social History Narrative   Ms. Blagg lives in [place] with her husband.  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 She is from [place].   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 surveillance   Mitral valve prolapse    PONV (postoperative nausea and vomiting)    Primary osteoarthritis of right hip    Intraarticular steroid injection by Dr. Nelva Bush 01/2016   Rectocele    SA node dysfunction [facility]    Past Surgical History:  Procedure Laterality Date   ABDOMINAL HYSTERECTOMY  05/1996   TVH--ovaries remain   ABDOMINAL SACROCOLPOPEXY N/A 06/13/2017   Procedure: ABDOMINO SACROCOLPOPEXY with [place]ans culdoplasty;  Surgeon: Nunzio Cobbs, MD;  Location: [facility];  Service: Gynecology;  Laterality: N/A;   ANTERIOR AND POSTERIOR REPAIR N/A 06/13/2017   Procedure: ANTERIOR (CYSTOCELE) AND POSTERIOR REPAIR (RECTOCELE);  Surgeon: Nunzio Cobbs, MD;  Location: [facility];  Service: Gynecology;  Laterality: N/A;   arthroscopic knee Right    BACK SURGERY  2012   disc rupture   BILATERAL SALPINGECTOMY Bilateral 06/13/2017   Procedure: BILATERAL SALPINGECTOMY;  Surgeon: Nunzio Cobbs, MD;  Location: [facility];  Service: Gynecology;  Laterality: Bilateral;   BLADDER SUSPENSION N/A 06/13/2017   Procedure: TRANSVAGINAL TAPE (TVT) PROCEDURE;  Surgeon: Nunzio Cobbs, MD;  Location: [facility];  Service: Gynecology;  Laterality: N/A;   COLONOSCOPY W/ POLYPECTOMY  06/24/15   Recall 5 yrs ([facility])   CYSTOSCOPY N/A 06/13/2017   Procedure: Consuela Mimes;  Surgeon: Nunzio Cobbs, MD;  Location: [facility];  Service: Gynecology;  Laterality: N/A;   KNEE RECONSTRUCTION Right 2008   LIVER BIOPSY  1997   fatty tumors   REPLACEMENT TOTAL KNEE Right 04/2019   Family History  Problem  Relation Age of Onset   Alcohol abuse Mother    CVA Mother        Dec 65   Heart disease Mother    Cancer Father        prostate   Arthritis Father    Alcohol abuse Sister    Heart disease Brother        congenital heart defect--Dec age 32 from MI   Heart murmur Son    Heart attack Son    Heart murmur Son    Heart murmur Son    Cancer Maternal Aunt 50       Dec colon CA age 63   Cancer Cousin 50       Dec colon CA age 65   Allergies as of 12/13/2021   No Known Allergies      Medication List        Accurate as of December 13, 2021  3:19 PM. If you have any questions, ask your nurse or doctor.          STOP taking these medications    albuterol 108 (90 Base) MCG/ACT inhaler Commonly known as: VENTOLIN HFA Stopped by: Howard Pouch, DO   estradiol 0.1 MG/GM vaginal cream Commonly known as: ESTRACE Stopped by: Howard Pouch, DO       TAKE these medications    calcium gluconate 500 MG tablet Take 1 tablet by mouth daily.   celecoxib 100 MG capsule Commonly known as: CELEBREX Take 1 capsule (100 mg total) by mouth 2 (two) times daily.   escitalopram 10 MG tablet Commonly known as: LEXAPRO Take 1 tablet (10 mg total) by mouth daily. Needs appointment for further refills   levothyroxine 25 MCG tablet Commonly known as: SYNTHROID Take 1 tablet (25 mcg total) by mouth daily before breakfast.   Vitamin D 125 MCG (5000 UT) Caps Take by mouth. Daily        All past medical history, surgical history, allergies, family history, immunizations andmedications were updated in the EMR today and reviewed under the history and medication portions of their EMR.     ROS: Negative, with the exception of above mentioned in HPI   Objective:  BP 135/78    Pulse (!) 59    Temp 97.8 F (36.6 C)    Wt 227 lb 3.2 oz (103.1 kg)    LMP 03/28/1996 (Within Months)    SpO2 98%    BMI 34.55 kg/m  Body mass index is 34.55 kg/m. Physical Exam Vitals and nursing note reviewed.   Constitutional:      General: She is not in acute distress.    Appearance: Normal appearance. She is not ill-appearing, toxic-appearing or diaphoretic.  HENT:     Head: Normocephalic and atraumatic.  Eyes:     General:  No scleral icterus.       Right eye: No discharge.        Left eye: No discharge.     Extraocular Movements: Extraocular movements intact.     Conjunctiva/sclera: Conjunctivae normal.     Pupils: Pupils are equal, round, and reactive to light.  Cardiovascular:     Rate and Rhythm: Normal rate and regular rhythm.  Pulmonary:     Effort: Pulmonary effort is normal. No respiratory distress.     Breath sounds: Normal breath sounds. No wheezing, rhonchi or rales.  Musculoskeletal:     Cervical back: Neck supple. No tenderness.  Lymphadenopathy:     Cervical: No cervical adenopathy.  Neurological:     Mental Status: She is alert and oriented to person, place, and time. Mental status is at baseline.     Motor: No weakness.     Gait: Gait normal.  Psychiatric:        Mood and Affect: Mood normal.        Behavior: Behavior normal.        Thought Content: Thought content normal.        Judgment: Judgment normal.     No results found. No results found. No results found for this or any previous visit (from the past 24 hour(s)).   Assessment/Plan: Cynthia [name] is a 66 y.o. female present for OV for [facility] visit Vasomotor flushing Stable continue  Lexapro 10 mg daily  Hypothyroidism, unspecified type Continue levothyroxine 25 mcg daily. - TSH collected today  Osteoarthritis of multiple joints, unspecified osteoarthritis type/Lumbar post-laminectomy syndrome/Degeneration of lumbar intervertebral disc Stable.  Continue  Celebrex bid Bmp collected today   FH heart disease:  Son MI at age 39.  Discussed Coronary Calcium score image with her today and she would like to schedule (self pay $100).  Reviewed expectations re: course of current medical  issues. Discussed self-management of symptoms. Outlined signs and symptoms indicating need for more acute intervention. Patient verbalized understanding and all questions were answered. Patient received an After-Visit Summary.  Return in about 24 weeks (around 05/30/2022) for CPE (30 min), CMC (30 min).   Orders Placed This Encounter  Procedures   CT CARDIAC SCORING (SELF PAY ONLY)   Basic Metabolic Panel (BMET)   TSH   Meds ordered this encounter  Medications   celecoxib (CELEBREX) 100 MG capsule    Sig: Take 1 capsule (100 mg total) by mouth 2 (two) times daily.    Dispense:  180 capsule    Refill:  1   escitalopram (LEXAPRO) 10 MG tablet    Sig: Take 1 tablet (10 mg total) by mouth daily. Needs appointment for further refills    Dispense:  90 tablet    Refill:  1   levothyroxine (SYNTHROID) 25 MCG tablet    Sig: Take 1 tablet (25 mcg total) by mouth daily before breakfast.    Dispense:  90 tablet    Refill:  3    Please add to existing refills. thanks   Referral Orders  No referral(s) requested today     Note is dictated utilizing voice recognition software. Although note has been proof read prior to signing, occasional typographical errors still can be missed. If any questions arise, please do not hesitate to call for verification.   electronically signed by:  Howard Pouch, DO  [place]

## 2021-12-14 LAB — BASIC METABOLIC PANEL
BUN: 19 mg/dL (ref 7–25)
CO2: 26 mmol/L (ref 20–32)
Calcium: 9.5 mg/dL (ref 8.6–10.4)
Chloride: 104 mmol/L (ref 98–110)
Creat: 0.83 mg/dL (ref 0.50–1.05)
Glucose, Bld: 174 mg/dL — ABNORMAL HIGH (ref 65–99)
Potassium: 4.4 mmol/L (ref 3.5–5.3)
Sodium: 140 mmol/L (ref 135–146)

## 2021-12-14 LAB — TSH: TSH: 2.67 mIU/L (ref 0.40–4.50)

## 2021-12-24 ENCOUNTER — Other Ambulatory Visit: Payer: Self-pay

## 2021-12-24 ENCOUNTER — Ambulatory Visit (HOSPITAL_BASED_OUTPATIENT_CLINIC_OR_DEPARTMENT_OTHER)
Admission: RE | Admit: 2021-12-24 | Discharge: 2021-12-24 | Disposition: A | Discharge status: Home / Self Care | Payer: BC Managed Care – PPO | Source: Ambulatory Visit | Attending: Family Medicine | Admitting: Family Medicine

## 2021-12-24 DIAGNOSIS — E039 Hypothyroidism, unspecified: Secondary | ICD-10-CM | POA: Insufficient documentation

## 2021-12-24 DIAGNOSIS — R232 Flushing: Secondary | ICD-10-CM | POA: Insufficient documentation

## 2021-12-24 DIAGNOSIS — Z8249 Family history of ischemic heart disease and other diseases of the circulatory system: Secondary | ICD-10-CM | POA: Insufficient documentation

## 2022-01-26 ENCOUNTER — Telehealth: Payer: Self-pay | Admitting: Family Medicine

## 2022-01-26 NOTE — Telephone Encounter (Signed)
LVM for pt to CB regarding results.  

## 2022-01-26 NOTE — Telephone Encounter (Signed)
FYI: ? ?Pt called said she was calling for her results, she never called the last time  ? ?Pt cell: [phone] ?

## 2022-02-28 ENCOUNTER — Telehealth: Payer: Self-pay

## 2022-02-28 NOTE — Telephone Encounter (Signed)
Pharmacy updated.

## 2022-02-28 NOTE — Telephone Encounter (Signed)
Patient called to request preferred pharmacy to be changed to   [place], [place], [place] ? ?Please remove [address] ? ?No call back is needed at this time ?

## 2022-03-07 NOTE — Telephone Encounter (Signed)
ERROR

## 2022-05-04 ENCOUNTER — Telehealth: Payer: Self-pay

## 2022-05-04 NOTE — Telephone Encounter (Signed)
Request for surgical clearance was received today from [facility].  All fields must be completed.If fields unknown, surgical office must be contacted. What type of surgery is being performed? R total arthroplasty  What type of anesthesia? Spinal  When is this surgery scheduled? 06/13/22  Name of physician performing surgery? Dr. Paralee Cancel  Patient has been called and appt for surgical clearance has been scheduled: Yes, lvm for pt to cb and sched appt   Cynthia [name]

## 2022-05-17 ENCOUNTER — Ambulatory Visit: Payer: BC Managed Care – PPO | Admitting: Family Medicine

## 2022-05-17 ENCOUNTER — Encounter: Payer: Self-pay | Admitting: Family Medicine

## 2022-05-17 VITALS — BP 104/67 | HR 62 | Temp 98.2°F | Ht 68.0 in | Wt 230.0 lb

## 2022-05-17 DIAGNOSIS — Z01818 Encounter for other preprocedural examination: Secondary | ICD-10-CM | POA: Diagnosis not present

## 2022-05-17 DIAGNOSIS — E669 Obesity, unspecified: Secondary | ICD-10-CM

## 2022-05-17 DIAGNOSIS — M159 Polyosteoarthritis, unspecified: Secondary | ICD-10-CM | POA: Diagnosis not present

## 2022-05-17 DIAGNOSIS — M1611 Unilateral primary osteoarthritis, right hip: Secondary | ICD-10-CM | POA: Diagnosis not present

## 2022-05-17 DIAGNOSIS — Z6834 Body mass index (BMI) 34.0-34.9, adult: Secondary | ICD-10-CM | POA: Diagnosis not present

## 2022-05-17 LAB — COMPREHENSIVE METABOLIC PANEL
ALT: 24 U/L (ref 0–35)
AST: 21 U/L (ref 0–37)
Albumin: 4.6 g/dL (ref 3.5–5.2)
Alkaline Phosphatase: 98 U/L (ref 39–117)
BUN: 23 mg/dL (ref 6–23)
CO2: 25 mEq/L (ref 19–32)
Calcium: 10.1 mg/dL (ref 8.4–10.5)
Chloride: 101 mEq/L (ref 96–112)
Creatinine, Ser: 0.78 mg/dL (ref 0.40–1.20)
GFR: 79.61 mL/min (ref >60.00)
Glucose, Bld: 93 mg/dL (ref 70–99)
Potassium: 4.4 mEq/L (ref 3.5–5.1)
Sodium: 138 mEq/L (ref 135–145)
Total Bilirubin: 0.6 mg/dL (ref 0.2–1.2)
Total Protein: 7 g/dL (ref 6.0–8.3)

## 2022-05-17 LAB — CBC WITH DIFFERENTIAL/PLATELET
Basophils Absolute: 0.1 10*3/uL (ref 0.0–0.1)
Basophils Relative: 0.8 % (ref 0.0–3.0)
Eosinophils Absolute: 0.2 10*3/uL (ref 0.0–0.7)
Eosinophils Relative: 3 % (ref 0.0–5.0)
HCT: 39.8 % (ref 36.0–46.0)
Hemoglobin: 13.4 g/dL (ref 12.0–15.0)
Lymphocytes Relative: 33.1 % (ref 12.0–46.0)
Lymphs Abs: 2.3 10*3/uL (ref 0.7–4.0)
MCHC: 33.7 g/dL (ref 30.0–36.0)
MCV: 90.5 fl (ref 78.0–100.0)
Monocytes Absolute: 0.5 10*3/uL (ref 0.1–1.0)
Monocytes Relative: 7 % (ref 3.0–12.0)
Neutro Abs: 3.9 10*3/uL (ref 1.4–7.7)
Neutrophils Relative %: 56.1 % (ref 43.0–77.0)
Platelets: 285 10*3/uL (ref 150.0–400.0)
RBC: 4.4 Mil/uL (ref 3.87–5.11)
RDW: 15 % (ref 11.5–15.5)
WBC: 7 10*3/uL (ref 4.0–10.5)

## 2022-05-17 LAB — HEMOGLOBIN A1C: Hgb A1c MFr Bld: 6 % (ref 4.6–6.5)

## 2022-05-17 NOTE — Progress Notes (Unsigned)
Cynthia [name] , 1956/06/19, 66 y.o., female MRN: [id number] Cynthia [name] Care Team    Relationship Specialty Notifications Start End  Ma Hillock, DO PCP - General Family Medicine  07/14/15   Sanda Klein, MD PCP - Cardiology Cardiology Admissions 11/30/18   Rosana Berger, MD Consulting Physician Gastroenterology  06/29/15   Melina Schools, MD Consulting Physician Orthopedic Surgery  04/13/16     Chief Complaint  Cynthia [name] presents with   Pre-op Exam    Pt is not fasting     Subjective: Pt presents for an OV surgical clearance Procedure:right total hip arthroplasty Indication:right hip arthritis- pain Anesthesia:spinal Surgery type risk:   - Intermediate risk= orthopedics Prior anesthesia complications:none- except postop nausea and vomiting.  Family history of prior anesthesia complications:none Cardiac:    - CBD, PAD, stroke, MI, aortic stenosis-Cynthia [name] denies any prior history.   - METs: greater than 4 METS Pulmonary: no h/o pulm disease Endocrine: no h/o diabetes. Thyroid disorder is well treated Obesity:Body mass index is 34.97 kg/m. Chronic kidney disease:no prior history Chronic med that needs to be continued: synthroid and lexapro Anticoagulation: celebrex- stop 1-2 weeks prior.   Results for orders placed or performed in visit on 05/17/22 (from the past 48 hour(s))  Hemoglobin A1c     Status: None   Collection Time: 05/17/22 10:53 AM  Result Value Ref Range   Hgb A1c MFr Bld 6.0 4.6 - 6.5 %    Comment: Glycemic Control Guidelines for People with Diabetes:Non Diabetic:  <6%Goal of Therapy: <7%Additional Action Suggested:  >8%   CBC w/Diff     Status: None   Collection Time: 05/17/22 10:53 AM  Result Value Ref Range   WBC 7.0 4.0 - 10.5 K/uL   RBC 4.40 3.87 - 5.11 Mil/uL   Hemoglobin 13.4 12.0 - 15.0 g/dL   HCT 39.8 36.0 - 46.0 %   MCV 90.5 78.0 - 100.0 fl   MCHC 33.7 30.0 - 36.0 g/dL   RDW 15.0 11.5 - 15.5 %   Platelets 285.0 150.0 - 400.0 K/uL    Neutrophils Relative % 56.1 43.0 - 77.0 %   Lymphocytes Relative 33.1 12.0 - 46.0 %   Monocytes Relative 7.0 3.0 - 12.0 %   Eosinophils Relative 3.0 0.0 - 5.0 %   Basophils Relative 0.8 0.0 - 3.0 %   Neutro Abs 3.9 1.4 - 7.7 K/uL   Lymphs Abs 2.3 0.7 - 4.0 K/uL   Monocytes Absolute 0.5 0.1 - 1.0 K/uL   Eosinophils Absolute 0.2 0.0 - 0.7 K/uL   Basophils Absolute 0.1 0.0 - 0.1 K/uL  Comp Met (CMET)     Status: None   Collection Time: 05/17/22 10:53 AM  Result Value Ref Range   Sodium 138 135 - 145 mEq/L   Potassium 4.4 3.5 - 5.1 mEq/L   Chloride 101 96 - 112 mEq/L   CO2 25 19 - 32 mEq/L   Glucose, Bld 93 70 - 99 mg/dL   BUN 23 6 - 23 mg/dL   Creatinine, Ser 0.78 0.40 - 1.20 mg/dL   Total Bilirubin 0.6 0.2 - 1.2 mg/dL   Alkaline Phosphatase 98 39 - 117 U/L   AST 21 0 - 37 U/L   ALT 24 0 - 35 U/L   Total Protein 7.0 6.0 - 8.3 g/dL   Albumin 4.6 3.5 - 5.2 g/dL   GFR 79.61 >60.00 mL/min    Comment: Calculated using the CKD-EPI Creatinine Equation (2021)   Calcium  10.1 8.4 - 10.5 mg/dL        12/13/2021    3:01 PM 09/22/2020    8:42 AM 02/26/2020   10:27 AM 06/20/2018   10:12 AM 08/16/2017    8:42 AM  Depression screen PHQ 2/9  Decreased Interest 0 0 0 0 0  Down, Depressed, Hopeless 0 0 0 0 0  PHQ - 2 Score 0 0 0 0 0  Altered sleeping 0      Tired, decreased energy 0      Change in appetite 0      Feeling bad or failure about yourself  0      Trouble concentrating 0      Moving slowly or fidgety/restless 0      Suicidal thoughts 0      PHQ-9 Score 0        No Known Allergies Social History   Social History Narrative   Cynthia [name] lives in [place] with her husband.  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 Cynthia [name] has 3 grown sons. Cynthia [name] is from [place].   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 surveillance   Mitral valve prolapse    PONV (postoperative nausea and vomiting)    Primary osteoarthritis of right hip    Intraarticular steroid injection by Dr. Nelva Bush 01/2016   Rectocele    SA node dysfunction [facility])    Past Surgical History:  Procedure Laterality Date   ABDOMINAL HYSTERECTOMY  05/1996   TVH--ovaries remain   ABDOMINAL SACROCOLPOPEXY N/A 06/13/2017   Procedure: ABDOMINO SACROCOLPOPEXY with [place] culdoplasty;  Surgeon: Nunzio Cobbs, MD;  Location: [facility];  Service: Gynecology;  Laterality: N/A;   ANTERIOR AND POSTERIOR REPAIR N/A 06/13/2017   Procedure: ANTERIOR (CYSTOCELE) AND POSTERIOR REPAIR (RECTOCELE);  Surgeon: Nunzio Cobbs, MD;  Location: [facility];  Service: Gynecology;  Laterality: N/A;   arthroscopic knee Right    BACK SURGERY  2012   disc rupture   BILATERAL SALPINGECTOMY Bilateral 06/13/2017   Procedure: BILATERAL SALPINGECTOMY;  Surgeon: Nunzio Cobbs, MD;  Location: [facility];  Service: Gynecology;  Laterality: Bilateral;   BLADDER SUSPENSION N/A 06/13/2017   Procedure: TRANSVAGINAL TAPE (TVT) PROCEDURE;  Surgeon: Nunzio Cobbs, MD;  Location: [facility];  Service: Gynecology;  Laterality: N/A;   COLONOSCOPY W/ POLYPECTOMY  06/24/15   Recall 5 yrs (Dr[facility])   CYSTOSCOPY N/A 06/13/2017   Procedure: Consuela Mimes;  Surgeon: Nunzio Cobbs, MD;  Location: [facility];  Service: Gynecology;  Laterality: N/A;   KNEE RECONSTRUCTION Right 2008   LIVER BIOPSY  1997   fatty tumors   REPLACEMENT TOTAL KNEE Right 04/2019   Family History  Problem Relation Age of Onset   Alcohol abuse Mother    CVA Mother        Dec 65   Heart disease Mother    Cancer Father        prostate   Arthritis  Father     Alcohol abuse Sister    Heart disease Brother        congenital heart defect--Dec age 61 from MI   Heart murmur Son    Heart attack Son    Heart murmur Son    Heart murmur Son    Cancer Maternal Aunt 50       Dec colon CA age 41   Cancer Cousin 50       Dec colon CA age 44   Allergies as of 05/17/2022   No Known Allergies      Medication List        Accurate as of May 17, 2022 11:59 PM. If you have any questions, ask your nurse or doctor.          calcium gluconate 500 MG tablet Take 1 tablet by mouth daily.   celecoxib 100 MG capsule Commonly known as: CELEBREX Take 1 capsule (100 mg total) by mouth 2 (two) times daily.   escitalopram 10 MG tablet Commonly known as: LEXAPRO Take 1 tablet (10 mg total) by mouth daily. Needs appointment for further refills   levothyroxine 25 MCG tablet Commonly known as: SYNTHROID Take 1 tablet (25 mcg total) by mouth daily before breakfast.   Vitamin D 125 MCG (5000 UT) Caps Take by mouth. Daily        All past medical history, surgical history, allergies, family history, immunizations andmedications were updated in the EMR today and reviewed under the history and medication portions of their EMR.     ROS Negative, with the exception of above mentioned in HPI   Objective:  BP 104/67   Pulse 62   Temp 98.2 F (36.8 C) (Oral)   Ht $R'5\' 8"'UP$  (1.727 m)   Wt 230 lb (104.3 kg)   LMP 03/28/1996 (Within Months)   SpO2 97%   BMI 34.97 kg/m  Body mass index is 34.97 kg/m. Physical Exam Vitals and nursing note reviewed.  Constitutional:      General: Cynthia [name] is not in acute distress.    Appearance: Normal appearance. Cynthia [name] is not ill-appearing, toxic-appearing or diaphoretic.  HENT:     Head: Normocephalic and atraumatic.  Eyes:     General: No scleral icterus.       Right eye: No discharge.        Left eye: No discharge.     Extraocular Movements: Extraocular movements intact.     Conjunctiva/sclera: Conjunctivae normal.      Pupils: Pupils are equal, round, and reactive to light.  Cardiovascular:     Rate and Rhythm: Normal rate and regular rhythm.     Heart sounds: No murmur heard. Pulmonary:     Effort: Pulmonary effort is normal. No respiratory distress.     Breath sounds: Normal breath sounds. No wheezing, rhonchi or rales.  Musculoskeletal:     Cervical back: Neck supple. No tenderness.     Right lower leg: No edema.     Left lower leg: No edema.  Lymphadenopathy:     Cervical: No cervical adenopathy.  Skin:    General: Skin is warm and dry.     Coloration: Skin is not jaundiced or pale.     Findings: No erythema or rash.  Neurological:     Mental Status: Cynthia [name] is alert and oriented to person, place, and time. Mental status is at baseline.     Motor: No weakness.     Gait: Gait normal.  Psychiatric:  Mood and Affect: Mood normal.        Behavior: Behavior normal.        Thought Content: Thought content normal.        Judgment: Judgment normal.       Assessment/Plan: ABREANNA DRAWDY is a 66 y.o. female present for OV for  Arthritis of right hip Cynthia [name] was instructed to stop Celebrex 1 to 2 weeks prior to her surgical procedure along with any other NSAIDs.  Her Ortho team will guide her on their recommendations as well. - Hemoglobin A1c - CBC w/Diff - Comp Met (CMET) Obesity (BMI 30-39.9) Body mass index is 34.97 kg/m. Pre-operative clearance To the best of my knowledge and per patients reported PMH, there is not a medical contraindication for undergoing orthopedic surgery.  Cynthia [name] is of low/average risk for medical/cardiac complications. Avoid NSAID prior to procedure, per orthopedic team instruction.  Cynthia [name] understands the purpose of preoperative visit is to attempt to minimize surgical complications and communicate to surgical team chronic conditions and management. No Cynthia [name] is free of risk when undergoing a procedure. The decision about whether to proceed with the  operation belongs to the surgeon and the Cynthia [name]. Cynthia [name]'s chronic conditions have been stable.  Cynthia [name] is not anemic.  Cynthia [name] is not a diabetic.   - Hemoglobin A1c>6.0 - CBC w/Diff> WNL - Comp Met (CMET)> WNL     Reviewed expectations re: course of current medical issues. Discussed self-management of symptoms. Outlined signs and symptoms indicating need for more acute intervention. Cynthia [name] verbalized understanding and all questions were answered. Cynthia [name] received an After-Visit Summary.    Orders Placed This Encounter  Procedures   Hemoglobin A1c   CBC w/Diff   Comp Met (CMET)   No orders of the defined types were placed in this encounter.  Referral Orders  No referral(s) requested today     Note is dictated utilizing voice recognition software. Although note has been proof read prior to signing, occasional typographical errors still can be missed. If any questions arise, please do not hesitate to call for verification.   electronically signed by:  Howard Pouch, DO  [place]

## 2022-05-17 NOTE — Patient Instructions (Addendum)
Preparing for Hip Replacement Preparing before hip replacement surgery can make recovery easier and more comfortable. The information below gives some tips and guidelines that will help to prepare for this surgery. Talk with your health care provider so you can learn what to expect before, during, and after surgery. Ask questions if you do not understand something. Tell a health care provider about: Any allergies you have. All medicines you are taking, including vitamins, herbs, eye drops, creams, and over-the-counter medicines. Any problems you or family members have had with anesthetic medicines. Any blood disorders you have. Any surgeries you have had. Any medical conditions you have. Whether you are pregnant or may be pregnant. What happens before the procedure? Health care provider visits You will need to have a physical exam before you are scheduled for surgery (preoperative exam) to make sure it is safe for you to have surgery. This may require you to have more tests. When you go to the preoperative exam, bring a list of all the medicines, vitamins, herbs, and supplements that you take. Have dental care and routine cleanings done before surgery. Germs from anywhere in your body, including your mouth, can travel to your new joint and infect it. Tell your dentist that you plan to have hip replacement surgery. Surgery costs To find out how much surgery will cost, call your insurance company as soon as you decide to have surgery. Ask questions such as: How much of the surgery and hospital stay will be covered? What will be covered for the following items? Medical equipment. Rehabilitation facilities. Home care. Medicines. Preparing your home Pick a recovery spot that is not your bed. During recovery, it is better that you sit more upright than you can in your bed. Here are some tips for choosing a recovery spot: Choose a chair with arms and a high, firm seat that will not allow you  to sink down into it. Chairs and sofas that are too soft can allow your hip to bend at an angle greater than 90 degrees. This could put you at risk for moving your new hip joint out of place (dislocating it). Place items that you often use on a small table next to your chair. These items may include the TV remote control, a mobile phone, a book, a laptop computer, and a water glass. You may be given a walker to use at home. Check if you will have enough room to use a walker. Move around your home with your hands out about 6 inches (15 cm) from your sides. You will have enough room if you do not hit anything with your hands as you do this. Walk from: Your recovery spot to your kitchen and bathroom. Your bed to the bathroom. Apply these general tips: Move the items you use most often to shelves and drawers at countertop height. Do this in your kitchen, bathroom, and bedroom. Prepare some meals to freeze and reheat later. Home safety     Remove all clutter and throw rugs from your floors. Doing this will help you avoid tripping. Consider getting safety equipment that will be helpful during your recovery, such as: Grab bars in the shower and near the toilet. A raised toilet seat. This will help you get on and off the toilet more easily. A tub or shower bench. Preparing your body If you smoke, quit as soon as you can before surgery. If there is time, it is best to quit several months before surgery. Tell your  surgeon if you use any products that contain nicotine or tobacco, such as cigarettes, e-cigarettes, and chewing tobacco. These can delay healing. If you need help quitting, ask your health care provider. Maintain a healthy diet. Do not change your diet before surgery unless your health care provider tells you to do that. Do not drink any alcohol for at least 48 hours before surgery. Talk to your health care provider about doing exercises before your surgery. Be sure to follow the exercise  program given by your health care provider. Doing these exercises in the weeks before your surgery may help reduce pain and improve function after surgery. Recovery planning In the first couple of weeks after surgery, it will be harder for you to do some of your regular activities. You may get tired easily, and you may have limited movement in your leg. To make sure you have all the help you need after your surgery: Plan to have a responsible adult take you home from the hospital. Your health care provider will tell you how many days you can expect to be in the hospital. Cancel all of your work, caregiving, and volunteer responsibilities for at least 4-6 weeks after surgery. Plan to have a responsible adult stay with you both day and night for the first week. This person should be someone you are comfortable with. You may need this person to help you with your exercises and personal care, such as bathing and using the toilet. If you live alone, arrange for someone to take care of your home and pets for the first 4-6 weeks after surgery. You may not be able to drive for 4-6 weeks. Plan to have someone drive you on errands and to appointments. Consider applying for a disability parking permit. To get an application, call your local Department of Motor Vehicles [facility]) or your health care provider's office. Summary Getting prepared before hip replacement surgery can make your recovery easier and more comfortable. Keep all of your preoperative appointments to make sure that you are ready for your surgery. Prepare your home and arrange for help at home. Plan to have a responsible adult take you home from the hospital and stay with you day and night for the first week. This information is not intended to replace advice given to you by your health care provider. Make sure you discuss any questions you have with your health care provider. Document Revised: 04/09/2020 Document Reviewed: 04/09/2020 Elsevier  Patient Education  [place].

## 2022-05-18 ENCOUNTER — Telehealth: Payer: Self-pay | Admitting: Family Medicine

## 2022-05-18 NOTE — Telephone Encounter (Signed)
OV and labs printed.

## 2022-05-18 NOTE — Telephone Encounter (Signed)
Faxed

## 2022-05-18 NOTE — Telephone Encounter (Signed)
Completed patient's preoperative risk assessment. Please fax assessment, recent lab results and office visit note from 05/17/2022 to her orthopedic team.  Thank you

## 2022-06-09 ENCOUNTER — Other Ambulatory Visit: Payer: Self-pay

## 2022-06-09 MED ORDER — CELECOXIB 100 MG PO CAPS: 100.0000 mg | ORAL_CAPSULE | Freq: Two times a day (BID) | ORAL | 0 refills | Status: DC

## 2022-07-07 ENCOUNTER — Other Ambulatory Visit: Payer: Self-pay | Admitting: Family Medicine

## 2022-07-27 ENCOUNTER — Telehealth: Payer: Self-pay | Admitting: Family Medicine

## 2022-07-27 MED ORDER — LEVOTHYROXINE SODIUM 25 MCG PO TABS: 25.0000 ug | ORAL_TABLET | Freq: Every day | ORAL | 1 refills | Status: DC

## 2022-07-27 NOTE — Telephone Encounter (Signed)
Rx sent 

## 2022-07-27 NOTE — Addendum Note (Signed)
Addended by: Kavin Leech on: 07/27/2022 04:39 PM   Modules accepted: Orders

## 2022-07-27 NOTE — Telephone Encounter (Signed)
Pt does needs a refill sent to the pharmacy for her Levothyroxine. She does not have any refills available at the pharmacy and she is down to 2 pills.

## 2022-07-27 NOTE — Telephone Encounter (Signed)
LVM for pt to CB regarding medication.  Note: A year supply was sent in Jan. Pt isnt due for refills until then.

## 2022-08-18 ENCOUNTER — Telehealth: Payer: Self-pay

## 2022-08-18 DIAGNOSIS — Z1231 Encounter for screening mammogram for malignant neoplasm of breast: Secondary | ICD-10-CM

## 2022-08-18 NOTE — Telephone Encounter (Signed)
Order mammogram for pt. Pt unable to sched with mm bus.

## 2022-08-19 ENCOUNTER — Other Ambulatory Visit: Payer: Self-pay

## 2022-08-19 MED ORDER — ESCITALOPRAM OXALATE 10 MG PO TABS: 10.0000 mg | ORAL_TABLET | Freq: Every day | ORAL | 0 refills | Status: DC

## 2022-08-22 ENCOUNTER — Ambulatory Visit (INDEPENDENT_AMBULATORY_CARE_PROVIDER_SITE_OTHER): Payer: BC Managed Care – PPO | Admitting: Family Medicine

## 2022-08-22 ENCOUNTER — Encounter: Payer: Self-pay | Admitting: Family Medicine

## 2022-08-22 VITALS — BP 96/61 | HR 46 | Temp 97.8°F | Ht 68.0 in | Wt 231.0 lb

## 2022-08-22 DIAGNOSIS — Z0000 Encounter for general adult medical examination without abnormal findings: Secondary | ICD-10-CM

## 2022-08-22 DIAGNOSIS — M159 Polyosteoarthritis, unspecified: Secondary | ICD-10-CM | POA: Diagnosis not present

## 2022-08-22 DIAGNOSIS — E559 Vitamin D deficiency, unspecified: Secondary | ICD-10-CM | POA: Diagnosis not present

## 2022-08-22 DIAGNOSIS — E039 Hypothyroidism, unspecified: Secondary | ICD-10-CM | POA: Diagnosis not present

## 2022-08-22 DIAGNOSIS — M5136 Other intervertebral disc degeneration, lumbar region: Secondary | ICD-10-CM

## 2022-08-22 DIAGNOSIS — Z23 Encounter for immunization: Secondary | ICD-10-CM | POA: Diagnosis not present

## 2022-08-22 DIAGNOSIS — Z86010 Personal history of colonic polyps: Secondary | ICD-10-CM

## 2022-08-22 DIAGNOSIS — E669 Obesity, unspecified: Secondary | ICD-10-CM

## 2022-08-22 DIAGNOSIS — Z79899 Other long term (current) drug therapy: Secondary | ICD-10-CM

## 2022-08-22 DIAGNOSIS — Z1231 Encounter for screening mammogram for malignant neoplasm of breast: Secondary | ICD-10-CM

## 2022-08-22 LAB — CBC
HCT: 37 % (ref 36.0–46.0)
Hemoglobin: 12.3 g/dL (ref 12.0–15.0)
MCHC: 33.3 g/dL (ref 30.0–36.0)
MCV: 89.9 fl (ref 78.0–100.0)
Platelets: 248 10*3/uL (ref 150.0–400.0)
RBC: 4.12 Mil/uL (ref 3.87–5.11)
RDW: 15.1 % (ref 11.5–15.5)
WBC: 5.3 10*3/uL (ref 4.0–10.5)

## 2022-08-22 LAB — COMPREHENSIVE METABOLIC PANEL
ALT: 18 U/L (ref 0–35)
AST: 15 U/L (ref 0–37)
Albumin: 4.4 g/dL (ref 3.5–5.2)
Alkaline Phosphatase: 98 U/L (ref 39–117)
BUN: 20 mg/dL (ref 6–23)
CO2: 26 mEq/L (ref 19–32)
Calcium: 9.3 mg/dL (ref 8.4–10.5)
Chloride: 105 mEq/L (ref 96–112)
Creatinine, Ser: 0.71 mg/dL (ref 0.40–1.20)
GFR: 88.96 mL/min (ref >60.00)
Glucose, Bld: 104 mg/dL — ABNORMAL HIGH (ref 70–99)
Potassium: 4.9 mEq/L (ref 3.5–5.1)
Sodium: 141 mEq/L (ref 135–145)
Total Bilirubin: 0.4 mg/dL (ref 0.2–1.2)
Total Protein: 6.6 g/dL (ref 6.0–8.3)

## 2022-08-22 LAB — LIPID PANEL
Cholesterol: 167 mg/dL (ref 0–200)
HDL: 65.7 mg/dL (ref >39.00)
LDL Cholesterol: 75 mg/dL (ref 0–99)
NonHDL: 100.91
Total CHOL/HDL Ratio: 3
Triglycerides: 132 mg/dL (ref 0.0–149.0)
VLDL: 26.4 mg/dL (ref 0.0–40.0)

## 2022-08-22 LAB — TSH: TSH: 2 u[IU]/mL (ref 0.35–5.50)

## 2022-08-22 LAB — VITAMIN D 25 HYDROXY (VIT D DEFICIENCY, FRACTURES): VITD: 50.86 ng/mL (ref 30.00–100.00)

## 2022-08-22 MED ORDER — CELECOXIB 100 MG PO CAPS
100.0000 mg | ORAL_CAPSULE | Freq: Two times a day (BID) | ORAL | 1 refills | Status: DC
Start: 2022-08-22 — End: 2023-02-17

## 2022-08-22 MED ORDER — ESCITALOPRAM OXALATE 10 MG PO TABS: 10.0000 mg | ORAL_TABLET | Freq: Every day | ORAL | 1 refills | Status: DC

## 2022-08-22 NOTE — Patient Instructions (Signed)
No follow-ups on file.        Great to see you today.  I have refilled the medication(s) we provide.   If labs were collected, we will inform you of lab results once received either by echart message or telephone call.   - echart message- for normal results that have been seen by the patient already.   - telephone call: abnormal results or if patient has not viewed results in their echart. Health Maintenance After Age 66 After age 52, you are at a higher risk for certain long-term diseases and infections as well as injuries from falls. Falls are a major cause of broken bones and head injuries in people who are older than age 29. Getting regular preventive care can help to keep you healthy and well. Preventive care includes getting regular testing and making lifestyle changes as recommended by your health care provider. Talk with your health care provider about: Which screenings and tests you should have. A screening is a test that checks for a disease when you have no symptoms. A diet and exercise plan that is right for you. What should I know about screenings and tests to prevent falls? Screening and testing are the best ways to find a health problem early. Early diagnosis and treatment give you the best chance of managing medical conditions that are common after age 46. Certain conditions and lifestyle choices may make you more likely to have a fall. Your health care provider may recommend: Regular vision checks. Poor vision and conditions such as cataracts can make you more likely to have a fall. If you wear glasses, make sure to get your prescription updated if your vision changes. Medicine review. Work with your health care provider to regularly review all of the medicines you are taking, including over-the-counter medicines. Ask your health care provider about any side effects that may make you more likely to have a fall. Tell your health care provider if any medicines that you take make  you feel dizzy or sleepy. Strength and balance checks. Your health care provider may recommend certain tests to check your strength and balance while standing, walking, or changing positions. Foot health exam. Foot pain and numbness, as well as not wearing proper footwear, can make you more likely to have a fall. Screenings, including: Osteoporosis screening. Osteoporosis is a condition that causes the bones to get weaker and break more easily. Blood pressure screening. Blood pressure changes and medicines to control blood pressure can make you feel dizzy. Depression screening. You may be more likely to have a fall if you have a fear of falling, feel depressed, or feel unable to do activities that you used to do. Alcohol use screening. Using too much alcohol can affect your balance and may make you more likely to have a fall. Follow these instructions at home: Lifestyle Do not drink alcohol if: Your health care provider tells you not to drink. If you drink alcohol: Limit how much you have to: 0-1 drink a day for women. 0-2 drinks a day for men. Know how much alcohol is in your drink. In the [place] one drink equals one 12 oz bottle of beer (355 mL), one 5 oz glass of wine (148 mL), or one 1 oz glass of hard liquor (44 mL). Do not use any products that contain nicotine or tobacco. These products include cigarettes, chewing tobacco, and vaping devices, such as e-cigarettes. If you need help quitting, ask your health care provider. Activity  Follow a  regular exercise program to stay fit. This will help you maintain your balance. Ask your health care provider what types of exercise are appropriate for you. If you need a cane or walker, use it as recommended by your health care provider. Wear supportive shoes that have nonskid soles. Safety  Remove any tripping hazards, such as rugs, cords, and clutter. Install safety equipment such as grab bars in bathrooms and safety rails on stairs. Keep  rooms and walkways well-lit. General instructions Talk with your health care provider about your risks for falling. Tell your health care provider if: You fall. Be sure to tell your health care provider about all falls, even ones that seem minor. You feel dizzy, tiredness (fatigue), or off-balance. Take over-the-counter and prescription medicines only as told by your health care provider. These include supplements. Eat a healthy diet and maintain a healthy weight. A healthy diet includes low-fat dairy products, low-fat (lean) meats, and fiber from whole grains, beans, and lots of fruits and vegetables. Stay current with your vaccines. Schedule regular health, dental, and eye exams. Summary Having a healthy lifestyle and getting preventive care can help to protect your health and wellness after age 103. Screening and testing are the best way to find a health problem early and help you avoid having a fall. Early diagnosis and treatment give you the best chance for managing medical conditions that are more common for people who are older than age 24. Falls are a major cause of broken bones and head injuries in people who are older than age 68. Take precautions to prevent a fall at home. Work with your health care provider to learn what changes you can make to improve your health and wellness and to prevent falls. This information is not intended to replace advice given to you by your health care provider. Make sure you discuss any questions you have with your health care provider. Document Revised: 04/05/2021 Document Reviewed: 04/05/2021 Elsevier Patient Education  [place].

## 2022-08-22 NOTE — Progress Notes (Signed)
Patient ID: [name], female  DOB: Oct 25, 1956, 66 y.o.   MRN: [id number] Patient Care Team    Relationship Specialty Notifications Start End  Ma Hillock, DO PCP - General Family Medicine  07/14/15   Sanda Klein, MD PCP - Cardiology Cardiology Admissions 11/30/18   Rosana Berger, MD Consulting Physician Gastroenterology  06/29/15   Melina Schools, MD Consulting Physician Orthopedic Surgery  04/13/16     Chief Complaint  Patient presents with   Annual Exam    Pt is fasting    Subjective:  EUPHA LOBB is a 66 y.o.  Female  present for CPE [facility] All past medical history, surgical history, allergies, family history, immunizations, medications and social history were updated in the electronic medical record today. All recent labs, ED visits and hospitalizations within the last year were reviewed.  Health maintenance:  Colonoscopy: UTD 05/2020, Dr. Glennon Hamilton ([facility]). Polyps present. Pt states 5 year f/u.  Mammogram: Patient will like to be screened every 2 years, last mammogram 07/2019. BI-RADS 1. No family history present> ordered AP Cervical cancer screening: Hysterectomy, Pap smear not indicated. Has GYN for pelvics. Immunizations: Tetanus completed 2019, influenza completed today, PNA completed today. encouraged yearly. shingrix series completed.  Infectious disease screening: HIV completed, hepatitis C completed. DEXA: 04/2018 UTD- normal Assistive device: None Oxygen use: None Patient has a Dental home. Hospitalizations/ED visits: Reviewed  Vasomotor flushing Patient reports her symptoms are well controlled on  lexapro 10 mg qd.   Osteoarthritis of multiple joints, unspecified osteoarthritis type/Lumbar post-laminectomy syndrome/Degeneration of lumbar intervertebral disc Patient reports her symptoms are well controlled on celebrex BID.  Hypothyroidism, unspecified type Patient reports she is compliant with levo 25 mcg qd on an empty  stomach.      12/13/2021    3:01 PM 09/22/2020    8:42 AM 02/26/2020   10:27 AM 06/20/2018   10:12 AM 08/16/2017    8:42 AM  Depression screen PHQ 2/9  Decreased Interest 0 0 0 0 0  Down, Depressed, Hopeless 0 0 0 0 0  PHQ - 2 Score 0 0 0 0 0  Altered sleeping 0      Tired, decreased energy 0      Change in appetite 0      Feeling bad or failure about yourself  0      Trouble concentrating 0      Moving slowly or fidgety/restless 0      Suicidal thoughts 0      PHQ-9 Score 0          12/13/2021    3:01 PM  GAD 7 : Generalized Anxiety Score  Nervous, Anxious, on Edge 0  Control/stop worrying 0  Worry too much - different things 0  Trouble relaxing 0  Restless 0  Easily annoyed or irritable 0  Afraid - awful might happen 0  Total GAD 7 Score 0  Anxiety Difficulty Not difficult at all    Immunization History  Administered Date(s) Administered   Fluad Quad(high Dose 65+) 08/22/2022   Influenza Inj Mdck Quad Pf 09/07/2019   Influenza,inj,Quad PF,6+ Mos 09/15/2018, 09/22/2020   Influenza,inj,quad, With Preservative 09/07/2019   Influenza-Unspecified 07/21/2017, 09/15/2018   PFIZER(Purple Top)SARS-COV-2 Vaccination 02/28/2020, 03/27/2020   PNEUMOCOCCAL CONJUGATE-20 08/22/2022   Tdap 02/07/2008, 03/07/2018   Zoster Recombinat (Shingrix) 09/07/2019, 12/13/2019    Past Medical History:  Diagnosis Date   Arthritis    Cancer ([place])    skin on nose  Cervical disc disease    Cystocele    Heart murmur    History of adenomatous polyp of colon 06/24/2015   History of left hip replacement 05/14/2021   History of rheumatic fever    childhood   Hypothyroidism    Liver tumor (benign)    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 surveillance   Mitral valve prolapse    PONV (postoperative nausea and vomiting)    Primary osteoarthritis of right hip    Intraarticular steroid injection by Dr. Nelva Bush 01/2016   Rectocele    SA node dysfunction ([place])    No Known Allergies Past  Surgical History:  Procedure Laterality Date   ABDOMINAL HYSTERECTOMY  05/1996   TVH--ovaries remain   ABDOMINAL SACROCOLPOPEXY N/A 06/13/2017   Procedure: ABDOMINO SACROCOLPOPEXY with [place] culdoplasty;  Surgeon: Nunzio Cobbs, MD;  Location: [facility];  Service: Gynecology;  Laterality: N/A;   ANTERIOR AND POSTERIOR REPAIR N/A 06/13/2017   Procedure: ANTERIOR (CYSTOCELE) AND POSTERIOR REPAIR (RECTOCELE);  Surgeon: Nunzio Cobbs, MD;  Location: [facility];  Service: Gynecology;  Laterality: N/A;   arthroscopic knee Right    BACK SURGERY  2012   disc rupture   BILATERAL SALPINGECTOMY Bilateral 06/13/2017   Procedure: BILATERAL SALPINGECTOMY;  Surgeon: Nunzio Cobbs, MD;  Location: [facility];  Service: Gynecology;  Laterality: Bilateral;   BLADDER SUSPENSION N/A 06/13/2017   Procedure: TRANSVAGINAL TAPE (TVT) PROCEDURE;  Surgeon: Nunzio Cobbs, MD;  Location: [facility];  Service: Gynecology;  Laterality: N/A;   COLONOSCOPY W/ POLYPECTOMY  06/24/2015   Recall 5 yrs ([facility])   CYSTOSCOPY N/A 06/13/2017   Procedure: Consuela Mimes;  Surgeon: Nunzio Cobbs, MD;  Location: [facility];  Service: Gynecology;  Laterality: N/A;   JOINT REPLACEMENT  June 13, 2022, June 18, 2021, June 2020   R Hip, L Hip, R Knee   KNEE RECONSTRUCTION Right 2008   LIVER BIOPSY  1997   fatty tumors   REPLACEMENT TOTAL KNEE Right 04/2019   SPINE SURGERY  2010   Family History  Problem Relation Age of Onset   Alcohol abuse Mother    CVA Mother        Dec 65   Heart disease Mother    Stroke Mother    Cancer Father        prostate   Arthritis Father    Alcohol abuse Sister    Heart disease Brother        congenital heart defect--Dec age 48 from MI   Early death Brother    Heart murmur Son    Heart attack Son    Heart murmur Son    Heart murmur Son    Cancer Maternal Aunt 50       Dec colon CA age 46   Cancer Paternal Grandfather     Cancer Cousin 50       Dec colon CA age 38   Early death Maternal Uncle    Vision loss Maternal Uncle    Varicose Veins Maternal Aunt    Social History   Social History Narrative   Ms. Brougham lives in [place] with her husband.  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 She is from [place].    Allergies as of 08/22/2022   No Known Allergies      Medication List  Accurate as of August 22, 2022  9:29 AM. If you have any questions, ask your nurse or doctor.          calcium gluconate 500 MG tablet Take 1 tablet by mouth daily.   celecoxib 100 MG capsule Commonly known as: CELEBREX Take 1 capsule (100 mg total) by mouth 2 (two) times daily.   escitalopram 10 MG tablet Commonly known as: LEXAPRO Take 1 tablet (10 mg total) by mouth daily. Needs appointment for further refills   levothyroxine 25 MCG tablet Commonly known as: SYNTHROID Take 1 tablet (25 mcg total) by mouth daily before breakfast.   Vitamin D 125 MCG (5000 UT) Caps Take by mouth. Daily        All past medical history, surgical history, allergies, family history, immunizations andmedications were updated in the EMR today and reviewed under the history and medication portions of their EMR.     No results found for this or any previous visit (from the past 2160 hour(s)).  CT CARDIAC SCORING (SELF PAY ONLY) IMPRESSION: Coronary calcium score of 0.  IMPRESSION: No significant incidental findings.    ROS 14 pt review of systems performed and negative (unless mentioned in an HPI)  Objective: BP 96/61   Pulse (!) 46   Temp 97.8 F (36.6 C) (Oral)   Ht '5\' 8"'$  (1.727 m)   Wt 231 lb (104.8 kg)   LMP 03/28/1996 (Within Months)   SpO2 100%   BMI 35.12 kg/m  Physical Exam Vitals and nursing note reviewed.  Constitutional:      General: She is not in acute distress.    Appearance: Normal appearance. She is not  ill-appearing or toxic-appearing.  HENT:     Head: Normocephalic and atraumatic.     Right Ear: Tympanic membrane, ear canal and external ear normal. There is no impacted cerumen.     Left Ear: Tympanic membrane, ear canal and external ear normal. There is no impacted cerumen.     Nose: No congestion or rhinorrhea.     Mouth/Throat:     Mouth: Mucous membranes are moist.     Pharynx: Oropharynx is clear. No oropharyngeal exudate or posterior oropharyngeal erythema.  Eyes:     General: No scleral icterus.       Right eye: No discharge.        Left eye: No discharge.     Extraocular Movements: Extraocular movements intact.     Conjunctiva/sclera: Conjunctivae normal.     Pupils: Pupils are equal, round, and reactive to light.  Cardiovascular:     Rate and Rhythm: Normal rate and regular rhythm.     Pulses: Normal pulses.     Heart sounds: Normal heart sounds. No murmur heard.    No friction rub. No gallop.  Pulmonary:     Effort: Pulmonary effort is normal. No respiratory distress.     Breath sounds: Normal breath sounds. No stridor. No wheezing, rhonchi or rales.  Chest:     Chest wall: No tenderness.  Abdominal:     General: Abdomen is flat. Bowel sounds are normal. There is no distension.     Palpations: Abdomen is soft. There is no mass.     Tenderness: There is no abdominal tenderness. There is no right CVA tenderness, left CVA tenderness, guarding or rebound.     Hernia: No hernia is present.  Musculoskeletal:        General: No swelling, tenderness or deformity. Normal range of motion.  Cervical back: Normal range of motion and neck supple. No rigidity or tenderness.     Right lower leg: No edema.     Left lower leg: No edema.  Lymphadenopathy:     Cervical: No cervical adenopathy.  Skin:    General: Skin is warm and dry.     Coloration: Skin is not jaundiced or pale.     Findings: No bruising, erythema, lesion or rash.  Neurological:     General: No focal deficit  present.     Mental Status: She is alert and oriented to person, place, and time. Mental status is at baseline.     Cranial Nerves: No cranial nerve deficit.     Sensory: No sensory deficit.     Motor: No weakness.     Coordination: Coordination normal.     Gait: Gait normal.     Deep Tendon Reflexes: Reflexes normal.  Psychiatric:        Mood and Affect: Mood normal.        Behavior: Behavior normal.        Thought Content: Thought content normal.        Judgment: Judgment normal.      No results found.  Assessment/plan: BAHJA BENCE is a 66 y.o. female present for CPE/CMC Vasomotor flushing Stable Continue Lexapro 10 mg daily  Hypothyroidism, unspecified type Has been stable-continue levothyroxine 25 mcg daily.  Dose will be refilled after results received and appropriate dose. TSH collected today   Osteoarthritis of multiple joints, unspecified osteoarthritis type/Lumbar post-laminectomy syndrome/Degeneration of lumbar intervertebral disc Stable Continue Celebrex bid BMP collected today  FH heart disease:  Son MI at age 65.  Discussed Coronary Calcium score: ZERO, 2022  History of colon polyps 5 yr follow up. Vitamin D deficiency - VITAMIN D 25 Hydroxy (Vit-D Deficiency, Fractures) Encounter for long-term current use of medication - CBC - Comprehensive metabolic panel - Hemoglobin A1c - Lipid panel Breast cancer screening by mammogram - MM 3D SCREEN BREAST BILATERAL; Future Need for influenza vaccination - Flu Vaccine QUAD High Dose(Fluad) Need for pneumococcal 20-valent conjugate vaccination - Pneumococcal conjugate vaccine 20-valent (Prevnar 20)  Routine general medical examination at a health care facility Colonoscopy: UTD 05/2020, Dr. Glennon Hamilton ([facility]). Polyps present. Pt states 5 year f/u.  Mammogram: Patient will like to be screened every 2 years, last mammogram 07/2019. BI-RADS 1. No family history present> ordered AP Cervical  cancer screening: Hysterectomy, Pap smear not indicated. Has GYN for pelvics. Immunizations: Tetanus completed 2019, influenza completed today, PNA completed today. encouraged yearly. shingrix series completed.  Infectious disease screening: HIV completed, hepatitis C completed. DEXA: 04/2018 UTD- normal Patient was encouraged to exercise greater than 150 minutes a week. Patient was encouraged to choose a diet filled with fresh fruits and vegetables, and lean meats. AVS provided to patient today for education/recommendation on gender specific health and safety maintenance.  Return in about 24 weeks (around 02/06/2023) for Routine chronic condition follow-up.   Orders Placed This Encounter  Procedures   MM 3D SCREEN BREAST BILATERAL   Flu Vaccine QUAD High Dose(Fluad)   Pneumococcal conjugate vaccine 20-valent (Prevnar 20)   CBC   Comprehensive metabolic panel   Hemoglobin A1c   Lipid panel   TSH   VITAMIN D 25 Hydroxy (Vit-D Deficiency, Fractures)   Meds ordered this encounter  Medications   celecoxib (CELEBREX) 100 MG capsule    Sig: Take 1 capsule (100 mg total) by mouth 2 (two) times daily.  Dispense:  180 capsule    Refill:  1   escitalopram (LEXAPRO) 10 MG tablet    Sig: Take 1 tablet (10 mg total) by mouth daily. Needs appointment for further refills    Dispense:  90 tablet    Refill:  1   Referral Orders  No referral(s) requested today     Electronically signed by: Howard Pouch, [place]

## 2022-08-23 ENCOUNTER — Other Ambulatory Visit: Payer: Self-pay | Admitting: Family Medicine

## 2022-08-23 LAB — HEMOGLOBIN A1C: Hgb A1c MFr Bld: 5.9 % (ref 4.6–6.5)

## 2022-08-23 MED ORDER — LEVOTHYROXINE SODIUM 25 MCG PO TABS: 25.0000 ug | ORAL_TABLET | Freq: Every day | ORAL | 3 refills | Status: DC

## 2022-08-26 ENCOUNTER — Telehealth: Payer: Self-pay | Admitting: Family Medicine

## 2022-08-26 NOTE — Telephone Encounter (Signed)
Noted  

## 2022-08-26 NOTE — Telephone Encounter (Signed)
Read patient her results. She was fine.

## 2023-01-20 ENCOUNTER — Other Ambulatory Visit: Payer: Self-pay | Admitting: Family Medicine

## 2023-02-06 ENCOUNTER — Ambulatory Visit: Payer: BC Managed Care – PPO | Admitting: Family Medicine

## 2023-02-13 ENCOUNTER — Other Ambulatory Visit: Payer: Self-pay | Admitting: Family Medicine

## 2023-02-16 ENCOUNTER — Other Ambulatory Visit: Payer: Self-pay | Admitting: Family Medicine

## 2023-02-20 ENCOUNTER — Encounter: Payer: Self-pay | Admitting: Family Medicine

## 2023-02-20 ENCOUNTER — Ambulatory Visit: Payer: BC Managed Care – PPO | Admitting: Family Medicine

## 2023-02-20 VITALS — BP 103/67 | HR 61 | Temp 98.0°F | Wt 231.2 lb

## 2023-02-20 DIAGNOSIS — M159 Polyosteoarthritis, unspecified: Secondary | ICD-10-CM | POA: Diagnosis not present

## 2023-02-20 DIAGNOSIS — E039 Hypothyroidism, unspecified: Secondary | ICD-10-CM | POA: Diagnosis not present

## 2023-02-20 DIAGNOSIS — M5136 Other intervertebral disc degeneration, lumbar region: Secondary | ICD-10-CM

## 2023-02-20 DIAGNOSIS — B9689 Other specified bacterial agents as the cause of diseases classified elsewhere: Secondary | ICD-10-CM

## 2023-02-20 DIAGNOSIS — R232 Flushing: Secondary | ICD-10-CM | POA: Diagnosis not present

## 2023-02-20 DIAGNOSIS — E559 Vitamin D deficiency, unspecified: Secondary | ICD-10-CM

## 2023-02-20 DIAGNOSIS — J329 Chronic sinusitis, unspecified: Secondary | ICD-10-CM

## 2023-02-20 MED ORDER — CELECOXIB 200 MG PO CAPS: 200.0000 mg | ORAL_CAPSULE | Freq: Every day | ORAL | 1 refills | Status: DC

## 2023-02-20 MED ORDER — ESCITALOPRAM OXALATE 10 MG PO TABS: 10.0000 mg | ORAL_TABLET | Freq: Every day | ORAL | 1 refills | Status: DC

## 2023-02-20 MED ORDER — AMOXICILLIN-POT CLAVULANATE 875-125 MG PO TABS: 1.0000 | ORAL_TABLET | Freq: Two times a day (BID) | ORAL | 0 refills | Status: DC

## 2023-02-20 MED ORDER — LEVOTHYROXINE SODIUM 25 MCG PO TABS: 25.0000 ug | ORAL_TABLET | Freq: Every day | ORAL | 0 refills | Status: DC

## 2023-02-20 NOTE — Patient Instructions (Addendum)
Return in about 6 months (around 08/24/2023) for cpe (20 min), Routine chronic condition follow-up.        Great to see you today.  I have refilled the medication(s) we provide.   If labs were collected, we will inform you of lab results once received either by echart message or telephone call.   - echart message- for normal results that have been seen by the patient already.   - telephone call: abnormal results or if patient has not viewed results in their echart.

## 2023-02-20 NOTE — Progress Notes (Signed)
Patient ID: Cynthia [name], female  DOB: March 23, 1956, 67 y.o.   MRN: [id number] Patient Care Team    Relationship Specialty Notifications Start End  Ma Hillock, DO PCP - General Family Medicine  07/14/15   Sanda Klein, MD PCP - Cardiology Cardiology Admissions 11/30/18   Rosana Berger, MD Consulting Physician Gastroenterology  06/29/15   Melina Schools, MD Consulting Physician Orthopedic Surgery  04/13/16     Chief Complaint  Patient presents with   Hypothyroidism    Subjective:  Cynthia [name] is a 67 y.o.  Female  present for Chronic Conditions/illness Management and new acute concern  All past medical history, surgical history, allergies, family history, immunizations, medications and social history were updated in the electronic medical record today. All recent labs, ED visits and hospitalizations within the last year were reviewed.  Vasomotor flushing Patient reports her symptoms are well-controlled on  lexapro 10 mg qd.   Osteoarthritis of multiple joints, unspecified osteoarthritis type/Lumbar post-laminectomy syndrome/Degeneration of lumbar intervertebral disc Patient reports her symptoms are well controlled on celebrex BID.  Hypothyroidism, unspecified type Patient reports she is compliant t with levo 25 mcg qd on an empty stomach.   New problem Pt also complains of sinus congestion and pain. She has been using her nasal lavage and return is green. She endorses sinus pressure and congestion. She reports symptoms started a little over a week ago and she did not know if she had virus or allergies. She was seen at an UC and tested positive for COVID 02/14/2023. Pt states she has not had a fever in a week.      02/20/2023    9:25 AM 12/13/2021    3:01 PM 09/22/2020    8:42 AM 02/26/2020   10:27 AM 06/20/2018   10:12 AM  Depression screen PHQ 2/9  Decreased Interest 0 0 0 0 0  Down, Depressed, Hopeless 0 0 0 0 0  PHQ - 2 Score 0 0 0 0 0  Altered sleeping  0      Tired, decreased energy  0     Change in appetite  0     Feeling bad or failure about yourself   0     Trouble concentrating  0     Moving slowly or fidgety/restless  0     Suicidal thoughts  0     PHQ-9 Score  0         12/13/2021    3:01 PM  GAD 7 : Generalized Anxiety Score  Nervous, Anxious, on Edge 0  Control/stop worrying 0  Worry too much - different things 0  Trouble relaxing 0  Restless 0  Easily annoyed or irritable 0  Afraid - awful might happen 0  Total GAD 7 Score 0  Anxiety Difficulty Not difficult at all    Immunization History  Administered Date(s) Administered   Fluad Quad(high Dose 65+) 08/22/2022   Influenza Inj Mdck Quad Pf 09/07/2019   Influenza,inj,Quad PF,6+ Mos 09/15/2018, 09/22/2020   Influenza,inj,quad, With Preservative 09/07/2019   Influenza-Unspecified 07/21/2017, 09/15/2018   PFIZER(Purple Top)SARS-COV-2 Vaccination 02/28/2020, 03/27/2020   PNEUMOCOCCAL CONJUGATE-20 08/22/2022   Tdap 02/07/2008, 03/07/2018   Zoster Recombinat (Shingrix) 09/07/2019, 12/13/2019    Past Medical History:  Diagnosis Date   Arthritis    Cancer ([place])    skin on nose   Cervical disc disease    Cystocele    Heart murmur    History of adenomatous polyp of colon 06/24/2015  History of left hip replacement 05/14/2021   History of rheumatic fever    childhood   Hypothyroidism    Liver tumor (benign)    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 surveillance   Mitral valve prolapse    PONV (postoperative nausea and vomiting)    Primary osteoarthritis of right hip    Intraarticular steroid injection by Dr. Nelva Bush 01/2016   Rectocele    SA node dysfunction ([place])    No Known Allergies Past Surgical History:  Procedure Laterality Date   ABDOMINAL HYSTERECTOMY  05/1996   TVH--ovaries remain   ABDOMINAL SACROCOLPOPEXY N/A 06/13/2017   Procedure: ABDOMINO SACROCOLPOPEXY with Halbans culdoplasty;  Surgeon: Nunzio Cobbs, MD;  Location: [facility];   Service: Gynecology;  Laterality: N/A;   ANTERIOR AND POSTERIOR REPAIR N/A 06/13/2017   Procedure: ANTERIOR (CYSTOCELE) AND POSTERIOR REPAIR (RECTOCELE);  Surgeon: Nunzio Cobbs, MD;  Location: [facility];  Service: Gynecology;  Laterality: N/A;   arthroscopic knee Right    BACK SURGERY  2012   disc rupture   BILATERAL SALPINGECTOMY Bilateral 06/13/2017   Procedure: BILATERAL SALPINGECTOMY;  Surgeon: Nunzio Cobbs, MD;  Location: [facility];  Service: Gynecology;  Laterality: Bilateral;   BLADDER SUSPENSION N/A 06/13/2017   Procedure: TRANSVAGINAL TAPE (TVT) PROCEDURE;  Surgeon: Nunzio Cobbs, MD;  Location: [facility];  Service: Gynecology;  Laterality: N/A;   COLONOSCOPY W/ POLYPECTOMY  06/24/2015   Recall 5 yrs ([facility])   CYSTOSCOPY N/A 06/13/2017   Procedure: Consuela Mimes;  Surgeon: Nunzio Cobbs, MD;  Location: [facility];  Service: Gynecology;  Laterality: N/A;   JOINT REPLACEMENT  June 13, 2022, June 18, 2021, June 2020   R Hip, L Hip, R Knee   KNEE RECONSTRUCTION Right 2008   LIVER BIOPSY  1997   fatty tumors   REPLACEMENT TOTAL KNEE Right 04/2019   SPINE SURGERY  2010   Family History  Problem Relation Age of Onset   Alcohol abuse Mother    CVA Mother        Dec 65   Heart disease Mother    Stroke Mother    Cancer Father        prostate   Arthritis Father    Alcohol abuse Sister    Heart disease Brother        congenital heart defect--Dec age 31 from MI   Early death Brother    Heart murmur Son    Heart attack Son    Heart murmur Son    Heart murmur Son    Cancer Maternal Aunt 50       Dec colon CA age 36   Cancer Paternal Grandfather    Cancer Cousin 50       Dec colon CA age 62   Early death Maternal Uncle    Vision loss Maternal Uncle    Varicose Veins Maternal Aunt    Social History   Social History Narrative   Cynthia [name] lives in [place] with her husband.  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 She has 3 grown sons. She is from [place].    Allergies as of 02/20/2023   No Known Allergies      Medication List        Accurate as of February 20, 2023  9:54 AM. If you have any questions, ask your nurse or  doctor.          STOP taking these medications    calcium gluconate 500 MG tablet Stopped by: Howard Pouch, DO       TAKE these medications    amoxicillin-clavulanate 875-125 MG tablet Commonly known as: AUGMENTIN Take 1 tablet by mouth 2 (two) times daily. Started by: Howard Pouch, DO   celecoxib 200 MG capsule Commonly known as: CELEBREX Take 1 capsule (200 mg total) by mouth daily. What changed:  medication strength how much to take when to take this Changed by: Howard Pouch, DO   escitalopram 10 MG tablet Commonly known as: LEXAPRO Take 1 tablet (10 mg total) by mouth daily. Needs appointment for further refills   levothyroxine 25 MCG tablet Commonly known as: SYNTHROID Take 1 tablet (25 mcg total) by mouth daily before breakfast.   Vitamin D 125 MCG (5000 UT) Caps Take by mouth. Daily        All past medical history, surgical history, allergies, family history, immunizations andmedications were updated in the EMR today and reviewed under the history and medication portions of their EMR.     No results found for this or any previous visit (from the past 2160 hour(s)).  CT CARDIAC SCORING (SELF PAY ONLY) IMPRESSION: Coronary calcium score of 0.  IMPRESSION: No significant incidental findings.    ROS 14 pt review of systems performed and negative (unless mentioned in an HPI)  Objective: BP 103/67   Pulse 61   Temp 98 F (36.7 C)   Wt 231 lb 3.2 oz (104.9 kg)   LMP 03/28/1996 (Within Months)   SpO2 98%   BMI 35.15 kg/m  Physical Exam Vitals and nursing note reviewed.  Constitutional:      General: She is not in acute distress.    Appearance: Normal appearance. She is not  ill-appearing, toxic-appearing or diaphoretic.  HENT:     Head: Normocephalic and atraumatic.     Right Ear: Tympanic membrane and external ear normal.     Left Ear: Tympanic membrane, ear canal and external ear normal.     Nose: Congestion and rhinorrhea present.     Mouth/Throat:     Mouth: Mucous membranes are moist.     Pharynx: Posterior oropharyngeal erythema present. No oropharyngeal exudate.     Comments: PND, TTP max sinus  Eyes:     General: No scleral icterus.       Right eye: No discharge.        Left eye: No discharge.     Extraocular Movements: Extraocular movements intact.     Conjunctiva/sclera: Conjunctivae normal.     Pupils: Pupils are equal, round, and reactive to light.  Cardiovascular:     Rate and Rhythm: Normal rate and regular rhythm.  Pulmonary:     Effort: Pulmonary effort is normal. No respiratory distress.     Breath sounds: Normal breath sounds. No wheezing, rhonchi or rales.  Musculoskeletal:     Right lower leg: No edema.     Left lower leg: No edema.  Lymphadenopathy:     Cervical: No cervical adenopathy.  Skin:    General: Skin is warm.     Findings: No rash.  Neurological:     Mental Status: She is alert and oriented to person, place, and time. Mental status is at baseline.     Motor: No weakness.     Gait: Gait normal.  Psychiatric:        Mood and Affect: Mood normal.  Behavior: Behavior normal.        Thought Content: Thought content normal.        Judgment: Judgment normal.      No results found.  Assessment/plan: HENNESSEY KOTECKI is a 67 y.o. female present for Chronic Conditions/illness Management Vasomotor flushing Stable Continue Lexapro 10 mg daily> may taper down to 5 mg then off if desired.   Hypothyroidism, unspecified type Stable Continue levo Labs due next visit  Osteoarthritis of multiple joints, unspecified osteoarthritis type/Lumbar post-laminectomy syndrome/Degeneration of lumbar intervertebral  disc Stable Continue Celebrex bid  FH heart disease:  Son MI at age 35.  Discussed Coronary Calcium score: ZERO, 2022  Covid sinusitis: New problem Pt positive for covid day 8.  Sinus symptoms now.  Rest, hydrate.  +/- flonase, mucinex (DM if cough), nettie pot or nasal saline.  augmentin prescribed, take until completed.  F/U 2 weeks if not improved.   41 minutes spent on day of service covering chronic conditions, refills and new acute covid illness.   Return in about 6 months (around 08/24/2023) for cpe (20 min), Routine chronic condition follow-up.   No orders of the defined types were placed in this encounter.  Meds ordered this encounter  Medications   celecoxib (CELEBREX) 200 MG capsule    Sig: Take 1 capsule (200 mg total) by mouth daily.    Dispense:  90 capsule    Refill:  1   escitalopram (LEXAPRO) 10 MG tablet    Sig: Take 1 tablet (10 mg total) by mouth daily. Needs appointment for further refills    Dispense:  90 tablet    Refill:  1   levothyroxine (SYNTHROID) 25 MCG tablet    Sig: Take 1 tablet (25 mcg total) by mouth daily before breakfast.    Dispense:  90 tablet    Refill:  0    Please add to existing refills. thanks   amoxicillin-clavulanate (AUGMENTIN) 875-125 MG tablet    Sig: Take 1 tablet by mouth 2 (two) times daily.    Dispense:  20 tablet    Refill:  0   Referral Orders  No referral(s) requested today     Electronically signed by: Howard Pouch, [place]

## 2023-03-30 ENCOUNTER — Ambulatory Visit: Payer: BC Managed Care – PPO | Admitting: Family Medicine

## 2023-04-03 ENCOUNTER — Ambulatory Visit: Payer: BC Managed Care – PPO | Admitting: Family Medicine

## 2023-04-03 ENCOUNTER — Encounter: Payer: Self-pay | Admitting: Family Medicine

## 2023-04-03 VITALS — BP 112/73 | HR 67 | Ht 68.0 in | Wt 231.0 lb

## 2023-04-03 DIAGNOSIS — Z13220 Encounter for screening for lipoid disorders: Secondary | ICD-10-CM

## 2023-04-03 DIAGNOSIS — Z1329 Encounter for screening for other suspected endocrine disorder: Secondary | ICD-10-CM

## 2023-04-03 DIAGNOSIS — R12 Heartburn: Secondary | ICD-10-CM

## 2023-04-03 DIAGNOSIS — E559 Vitamin D deficiency, unspecified: Secondary | ICD-10-CM

## 2023-04-03 DIAGNOSIS — J418 Mixed simple and mucopurulent chronic bronchitis: Secondary | ICD-10-CM

## 2023-04-03 DIAGNOSIS — J42 Unspecified chronic bronchitis: Secondary | ICD-10-CM | POA: Insufficient documentation

## 2023-04-03 DIAGNOSIS — K219 Gastro-esophageal reflux disease without esophagitis: Secondary | ICD-10-CM | POA: Insufficient documentation

## 2023-04-03 DIAGNOSIS — Z131 Encounter for screening for diabetes mellitus: Secondary | ICD-10-CM

## 2023-04-03 MED ORDER — ALBUTEROL SULFATE HFA 108 (90 BASE) MCG/ACT IN AERS: 2.0000 | INHALATION_SPRAY | Freq: Four times a day (QID) | RESPIRATORY_TRACT | 2 refills | Status: DC | PRN

## 2023-04-03 MED ORDER — PANTOPRAZOLE SODIUM 20 MG PO TBEC: 20.0000 mg | DELAYED_RELEASE_TABLET | Freq: Every day | ORAL | 3 refills | Status: DC

## 2023-04-03 MED ORDER — PREDNISONE 20 MG PO TABS: 20.0000 mg | ORAL_TABLET | Freq: Two times a day (BID) | ORAL | 0 refills | Status: AC

## 2023-04-03 NOTE — Assessment & Plan Note (Signed)
Prednisone 20 mg x 5 days and Albuterol PRN for SOB symptoms. Discussed Symptomatic treatment, rest, increase oral fluid intake. Take OTC tylenol for fever or pain medications. Follow-up for worsening or persistent symptoms. Patient verbalizes understanding regarding plan of care and all questions answered

## 2023-04-03 NOTE — Patient Instructions (Signed)

## 2023-04-03 NOTE — Assessment & Plan Note (Signed)
Patient complains  heartburn and a sore throat which occurs constantly and has been unchanged since onset a few months ago. Risk factors include caffeine use. She has tried OTC TUMS for the symptoms.The treatment provided no relief. Past procedures do not include an abdominal ultrasound or an EGD  Trial on Protonix 20 mg daily  Explained to patient lifestyle modifications, weight loss, smoking cessation, loose clothing, and avoiding caffeine and trigger foods. Follow up in 4 weeks, if symptoms persist possible referral to EGD

## 2023-04-03 NOTE — Progress Notes (Signed)
New Patient Office Visit   Subjective   Patient ID: Cynthia [name], female    DOB: Aug 01, 1956  Age: 67 y.o. MRN: [id number]  CC:  Chief Complaint  Patient presents with   Establish Care   Gastroesophageal Reflux    Patient complains of cough since March after having covid. States she feels like she can't get a full breath, but coughs up phlegm.     HPI Cynthia [name] 67 year old female, presents to establish care. She  has a past medical history of Arthritis, Cancer (HCC), Cervical disc disease, Cystocele, Heart murmur, History of adenomatous polyp of colon (06/24/2015), History of left hip replacement (05/14/2021), History of rheumatic fever, Hypothyroidism, Liver tumor (benign), Mitral valve prolapse, PONV (postoperative nausea and vomiting), Primary osteoarthritis of right hip, Rectocele, and SA node dysfunction (HCC).  Patient complains of cough. Patient describes symptoms of shortness of breath at rest, cough, sore throat, and sputum production. Symptoms began a few months ago and are unchanged since that time. Patient denies chest pain or nausea and vomiting. Treatment thus far includes OTC medications such as TUMS and are not effective Past pulmonary history is significant for no history of pneumonia or bronchitis     .     Outpatient Encounter Medications as of 04/03/2023  Medication Sig   albuterol (VENTOLIN HFA) 108 (90 Base) MCG/ACT inhaler Inhale 2 puffs into the lungs every 6 (six) hours as needed for wheezing or shortness of breath.   amoxicillin-clavulanate (AUGMENTIN) 875-125 MG tablet Take 1 tablet by mouth 2 (two) times daily.   celecoxib (CELEBREX) 200 MG capsule Take 1 capsule (200 mg total) by mouth daily.   Cholecalciferol (VITAMIN D) 125 MCG (5000 UT) CAPS Take by mouth. Daily   escitalopram (LEXAPRO) 10 MG tablet Take 1 tablet (10 mg total) by mouth daily. Needs appointment for further refills   levothyroxine (SYNTHROID) 25 MCG tablet Take 1 tablet (25  mcg total) by mouth daily before breakfast.   pantoprazole (PROTONIX) 20 MG tablet Take 1 tablet (20 mg total) by mouth daily.   predniSONE (DELTASONE) 20 MG tablet Take 1 tablet (20 mg total) by mouth 2 (two) times daily with breakfast and lunch for 5 days.   No facility-administered encounter medications on file as of 04/03/2023.    Past Surgical History:  Procedure Laterality Date   ABDOMINAL HYSTERECTOMY  05/1996   TVH--ovaries remain   ABDOMINAL SACROCOLPOPEXY N/A 06/13/2017   Procedure: ABDOMINO SACROCOLPOPEXY with Halbans culdoplasty;  Surgeon: Patton Salles, MD;  Location: [facility];  Service: Gynecology;  Laterality: N/A;   ANTERIOR AND POSTERIOR REPAIR N/A 06/13/2017   Procedure: ANTERIOR (CYSTOCELE) AND POSTERIOR REPAIR (RECTOCELE);  Surgeon: Patton Salles, MD;  Location: [facility];  Service: Gynecology;  Laterality: N/A;   arthroscopic knee Right    BACK SURGERY  2012   disc rupture   BILATERAL SALPINGECTOMY Bilateral 06/13/2017   Procedure: BILATERAL SALPINGECTOMY;  Surgeon: Patton Salles, MD;  Location: [facility];  Service: Gynecology;  Laterality: Bilateral;   BLADDER SUSPENSION N/A 06/13/2017   Procedure: TRANSVAGINAL TAPE (TVT) PROCEDURE;  Surgeon: Patton Salles, MD;  Location: [facility];  Service: Gynecology;  Laterality: N/A;   COLONOSCOPY W/ POLYPECTOMY  06/24/2015   Recall 5 yrs ([facility])   CYSTOSCOPY N/A 06/13/2017   Procedure: Bluford Kaufmann;  Surgeon: Patton Salles, MD;  Location: [facility];  Service: Gynecology;  Laterality: N/A;  JOINT REPLACEMENT  June 13, 2022, June 18, 2021, June 2020   R Hip, L Hip, R Knee   KNEE RECONSTRUCTION Right 2008   LIVER BIOPSY  1997   fatty tumors   REPLACEMENT TOTAL KNEE Right 04/2019   SPINE SURGERY  2010    Review of Systems  Constitutional:  Negative for chills and fever.  Eyes:  Negative for blurred vision.  Respiratory:  Positive for cough, sputum  production and shortness of breath. Negative for hemoptysis and wheezing.   Cardiovascular:  Negative for chest pain.  Gastrointestinal:  Positive for heartburn. Negative for abdominal pain, blood in stool, constipation, diarrhea, melena, nausea and vomiting.  Genitourinary:  Negative for dysuria.  Neurological:  Negative for dizziness.      Objective    BP 112/73   Pulse 67   Ht 5\' 8"  (1.727 m)   Wt 231 lb (104.8 kg)   LMP 03/28/1996 (Within Months)   SpO2 97%   BMI 35.12 kg/m   Physical Exam Vitals reviewed.  Constitutional:      General: She is not in acute distress.    Appearance: Normal appearance. She is not ill-appearing, toxic-appearing or diaphoretic.  HENT:     Head: Normocephalic.  Eyes:     General:        Right eye: No discharge.        Left eye: No discharge.     Conjunctiva/sclera: Conjunctivae normal.  Cardiovascular:     Rate and Rhythm: Normal rate.     Pulses: Normal pulses.     Heart sounds: Normal heart sounds.  Pulmonary:     Effort: Pulmonary effort is normal. No respiratory distress.     Breath sounds: Normal breath sounds.  Abdominal:     General: Bowel sounds are normal.     Palpations: Abdomen is soft.     Tenderness: There is no abdominal tenderness. There is no guarding.  Musculoskeletal:        General: Normal range of motion.     Cervical back: Normal range of motion.  Skin:    General: Skin is warm and dry.     Capillary Refill: Capillary refill takes less than 2 seconds.  Neurological:     General: No focal deficit present.     Mental Status: She is alert and oriented to person, place, and time.     Coordination: Coordination normal.     Gait: Gait normal.  Psychiatric:        Mood and Affect: Mood normal.        Behavior: Behavior normal.       Assessment & Plan:  Mixed simple and mucopurulent chronic bronchitis (HCC) Assessment & Plan: Prednisone 20 mg x 5 days and Albuterol PRN for SOB symptoms. Discussed Symptomatic  treatment, rest, increase oral fluid intake. Take OTC tylenol for fever or pain medications. Follow-up for worsening or persistent symptoms. Patient verbalizes understanding regarding plan of care and all questions answered   Orders: -     predniSONE; Take 1 tablet (20 mg total) by mouth 2 (two) times daily with breakfast and lunch for 5 days.  Dispense: 10 tablet; Refill: 0 -     Albuterol Sulfate HFA; Inhale 2 puffs into the lungs every 6 (six) hours as needed for wheezing or shortness of breath.  Dispense: 8 g; Refill: 2  Vitamin D deficiency -     VITAMIN D 25 Hydroxy (Vit-D Deficiency, Fractures)  Screening for thyroid disorder -  TSH + free T4  Screening for lipid disorders -     Lipid panel -     CMP14+EGFR -     CBC with Differential/Platelet  Screening for diabetes mellitus -     Hemoglobin A1c  Heartburn Assessment & Plan: Patient complains  heartburn and a sore throat which occurs constantly and has been unchanged since onset a few months ago. Risk factors include caffeine use. She has tried OTC TUMS for the symptoms.The treatment provided no relief. Past procedures do not include an abdominal ultrasound or an EGD  Trial on Protonix 20 mg daily  Explained to patient lifestyle modifications, weight loss, smoking cessation, loose clothing, and avoiding caffeine and trigger foods. Follow up in 4 weeks, if symptoms persist possible referral to EGD   Orders: -     Pantoprazole Sodium; Take 1 tablet (20 mg total) by mouth daily.  Dispense: 30 tablet; Refill: 3    Return in about 4 months (around 08/04/2023), or if symptoms worsen or fail to improve, for chronic follow-up.   Cruzita Lederer Newman Nip, FNP

## 2023-04-06 ENCOUNTER — Other Ambulatory Visit: Payer: Self-pay | Admitting: Family Medicine

## 2023-04-06 DIAGNOSIS — R12 Heartburn: Secondary | ICD-10-CM

## 2023-04-11 ENCOUNTER — Ambulatory Visit (HOSPITAL_COMMUNITY)
Admission: RE | Admit: 2023-04-11 | Discharge: 2023-04-11 | Disposition: A | Discharge status: Home / Self Care | Payer: BC Managed Care – PPO | Source: Ambulatory Visit | Attending: Family Medicine | Admitting: Family Medicine

## 2023-04-11 ENCOUNTER — Encounter: Payer: Self-pay | Admitting: Family Medicine

## 2023-04-11 ENCOUNTER — Ambulatory Visit: Payer: BC Managed Care – PPO | Admitting: Family Medicine

## 2023-04-11 VITALS — BP 104/69 | HR 70 | Ht 68.0 in | Wt 232.0 lb

## 2023-04-11 DIAGNOSIS — J069 Acute upper respiratory infection, unspecified: Secondary | ICD-10-CM | POA: Diagnosis present

## 2023-04-11 MED ORDER — AZITHROMYCIN 250 MG PO TABS: ORAL_TABLET | ORAL | 0 refills | Status: DC

## 2023-04-11 NOTE — Progress Notes (Signed)
Patient Office Visit   Subjective   Patient ID: Cynthia [name], female    DOB: 1956-07-06  Age: 67 y.o. MRN: [id number]  CC:  Chief Complaint  Patient presents with   Cough    Pt reports rattling in lungs, thinks possible pneumomia, steroids have not improved her sx.     HPI Cynthia [name] 67 year old female, presents to the clinic for worsening shortness of breath and cough since our last visit. She  has a past medical history of Arthritis, Cancer (HCC), Cervical disc disease, Cystocele, Heart murmur, History of adenomatous polyp of colon (06/24/2015), History of left hip replacement (05/14/2021), History of rheumatic fever, Hypothyroidism, Liver tumor (benign), Mitral valve prolapse, PONV (postoperative nausea and vomiting), Primary osteoarthritis of right hip, Rectocele, and SA node dysfunction (HCC).  Patient complains continued worsening cough. Patient describes symptoms of shortness of breath on exertion, cough, fatigue, malaise, myalgias, sore throat, sputum production, and wheezing. Symptoms began several weeks ago and are gradually worsening since that time. Patient denies headaches, nausea and vomiting. Treatment thus far includes five day course of oral steroids with no relief. Past pulmonary history is significant for pneumonia.      Outpatient Encounter Medications as of 04/11/2023  Medication Sig   azithromycin (ZITHROMAX) 250 MG tablet Take 2 tablets on day 1, then 1 tablet daily on days 2 through 5   albuterol (VENTOLIN HFA) 108 (90 Base) MCG/ACT inhaler Inhale 2 puffs into the lungs every 6 (six) hours as needed for wheezing or shortness of breath.   celecoxib (CELEBREX) 200 MG capsule Take 1 capsule (200 mg total) by mouth daily.   Cholecalciferol (VITAMIN D) 125 MCG (5000 UT) CAPS Take by mouth. Daily   escitalopram (LEXAPRO) 10 MG tablet Take 1 tablet (10 mg total) by mouth daily. Needs appointment for further refills   levothyroxine (SYNTHROID) 25 MCG  tablet Take 1 tablet (25 mcg total) by mouth daily before breakfast.   pantoprazole (PROTONIX) 20 MG tablet Take 1 tablet (20 mg total) by mouth daily.   [DISCONTINUED] amoxicillin-clavulanate (AUGMENTIN) 875-125 MG tablet Take 1 tablet by mouth 2 (two) times daily.   No facility-administered encounter medications on file as of 04/11/2023.    Past Surgical History:  Procedure Laterality Date   ABDOMINAL HYSTERECTOMY  05/1996   TVH--ovaries remain   ABDOMINAL SACROCOLPOPEXY N/A 06/13/2017   Procedure: ABDOMINO SACROCOLPOPEXY with Halbans culdoplasty;  Surgeon: Patton Salles, MD;  Location: [facility];  Service: Gynecology;  Laterality: N/A;   ANTERIOR AND POSTERIOR REPAIR N/A 06/13/2017   Procedure: ANTERIOR (CYSTOCELE) AND POSTERIOR REPAIR (RECTOCELE);  Surgeon: Patton Salles, MD;  Location: [facility];  Service: Gynecology;  Laterality: N/A;   arthroscopic knee Right    BACK SURGERY  2012   disc rupture   BILATERAL SALPINGECTOMY Bilateral 06/13/2017   Procedure: BILATERAL SALPINGECTOMY;  Surgeon: Patton Salles, MD;  Location: [facility];  Service: Gynecology;  Laterality: Bilateral;   BLADDER SUSPENSION N/A 06/13/2017   Procedure: TRANSVAGINAL TAPE (TVT) PROCEDURE;  Surgeon: Patton Salles, MD;  Location: [facility];  Service: Gynecology;  Laterality: N/A;   COLONOSCOPY W/ POLYPECTOMY  06/24/2015   Recall 5 yrs ([facility])   CYSTOSCOPY N/A 06/13/2017   Procedure: Bluford Kaufmann;  Surgeon: Patton Salles, MD;  Location: [facility];  Service: Gynecology;  Laterality: N/A;   JOINT REPLACEMENT  June 13, 2022, June 18, 2021, June 2020  R Hip, L Hip, R Knee   KNEE RECONSTRUCTION Right 2008   LIVER BIOPSY  1997   fatty tumors   REPLACEMENT TOTAL KNEE Right 04/2019   SPINE SURGERY  2010    Review of Systems  Constitutional:  Positive for fever and malaise/fatigue.  HENT:  Positive for sore throat.   Respiratory:  Positive  for cough, sputum production, shortness of breath and wheezing.   Cardiovascular:  Negative for chest pain.  Gastrointestinal:  Negative for nausea and vomiting.  Neurological:  Negative for dizziness and headaches.      Objective    BP 104/69   Pulse 70   Ht 5\' 8"  (1.727 m)   Wt 232 lb (105.2 kg)   LMP 03/28/1996 (Within Months)   SpO2 90%   BMI 35.28 kg/m   Physical Exam Vitals reviewed.  Constitutional:      General: She is not in acute distress.    Appearance: Normal appearance. She is not ill-appearing, toxic-appearing or diaphoretic.  HENT:     Head: Normocephalic.  Eyes:     General:        Right eye: No discharge.        Left eye: No discharge.     Conjunctiva/sclera: Conjunctivae normal.  Cardiovascular:     Rate and Rhythm: Normal rate.     Pulses: Normal pulses.     Heart sounds: Normal heart sounds.  Pulmonary:     Effort: Pulmonary effort is normal.     Breath sounds: Rhonchi and rales present.  Abdominal:     Tenderness: There is no guarding.  Musculoskeletal:        General: Normal range of motion.     Cervical back: Normal range of motion.  Skin:    General: Skin is warm and dry.     Capillary Refill: Capillary refill takes less than 2 seconds.  Neurological:     General: No focal deficit present.     Mental Status: She is alert and oriented to person, place, and time.     Coordination: Coordination normal.     Gait: Gait normal.  Psychiatric:        Mood and Affect: Mood normal.        Behavior: Behavior normal.       Assessment & Plan:  Upper respiratory tract infection, unspecified type Assessment & Plan: Chest Xray Ordered- awaiting results will follow up. Started patient on Azithromycin 250 mg x 5 days Discussed symptomatic treatment, rest, increase oral fluid intake. Take OTC tylenol for fever medications. Follow-up for worsening or persistent symptoms. Patient verbalizes understanding regarding plan of care and all questions answered    Orders: -     DG Chest 2 View; Future -     Azithromycin; Take 2 tablets on day 1, then 1 tablet daily on days 2 through 5  Dispense: 6 tablet; Refill: 0    Return in about 4 months (around 08/12/2023), or if symptoms worsen or fail to improve, for chronic follow-up.   Cruzita Lederer Newman Nip, FNP

## 2023-04-11 NOTE — Assessment & Plan Note (Signed)
Chest Xray Ordered- awaiting results will follow up. Started patient on Azithromycin 250 mg x 5 days Discussed symptomatic treatment, rest, increase oral fluid intake. Take OTC tylenol for fever medications. Follow-up for worsening or persistent symptoms. Patient verbalizes understanding regarding plan of care and all questions answered

## 2023-04-11 NOTE — Patient Instructions (Signed)

## 2023-04-12 ENCOUNTER — Ambulatory Visit: Payer: BC Managed Care – PPO | Admitting: Family Medicine

## 2023-04-12 ENCOUNTER — Encounter: Payer: Self-pay | Admitting: Family Medicine

## 2023-04-12 LAB — CBC WITH DIFFERENTIAL/PLATELET
Basophils Absolute: 0 10*3/uL (ref 0.0–0.2)
Basos: 1 %
EOS (ABSOLUTE): 0.3 10*3/uL (ref 0.0–0.4)
Eos: 4 %
Hematocrit: 40.8 % (ref 34.0–46.6)
Hemoglobin: 13.7 g/dL (ref 11.1–15.9)
Immature Grans (Abs): 0 10*3/uL (ref 0.0–0.1)
Immature Granulocytes: 0 %
Lymphocytes Absolute: 2.7 10*3/uL (ref 0.7–3.1)
Lymphs: 34 %
MCH: 29.8 pg (ref 26.6–33.0)
MCHC: 33.6 g/dL (ref 31.5–35.7)
MCV: 89 fL (ref 79–97)
Monocytes Absolute: 0.5 10*3/uL (ref 0.1–0.9)
Monocytes: 6 %
Neutrophils Absolute: 4.4 10*3/uL (ref 1.4–7.0)
Neutrophils: 55 %
Platelets: 247 10*3/uL (ref 150–450)
RBC: 4.6 x10E6/uL (ref 3.77–5.28)
RDW: 13.4 % (ref 11.7–15.4)
WBC: 8 10*3/uL (ref 3.4–10.8)

## 2023-04-12 LAB — CMP14+EGFR
ALT: 24 IU/L (ref 0–32)
AST: 17 IU/L (ref 0–40)
Albumin/Globulin Ratio: 2 (ref 1.2–2.2)
Albumin: 4.3 g/dL (ref 3.9–4.9)
Alkaline Phosphatase: 129 IU/L — ABNORMAL HIGH (ref 44–121)
BUN/Creatinine Ratio: 18 (ref 12–28)
BUN: 14 mg/dL (ref 8–27)
Bilirubin Total: 0.6 mg/dL (ref 0.0–1.2)
CO2: 21 mmol/L (ref 20–29)
Calcium: 9.3 mg/dL (ref 8.7–10.3)
Chloride: 102 mmol/L (ref 96–106)
Creatinine, Ser: 0.8 mg/dL (ref 0.57–1.00)
Globulin, Total: 2.1 g/dL (ref 1.5–4.5)
Glucose: 128 mg/dL — ABNORMAL HIGH (ref 70–99)
Potassium: 4.8 mmol/L (ref 3.5–5.2)
Sodium: 139 mmol/L (ref 134–144)
Total Protein: 6.4 g/dL (ref 6.0–8.5)
eGFR: 81 mL/min/{1.73_m2} (ref >59)

## 2023-04-12 LAB — LIPID PANEL
Chol/HDL Ratio: 2.3 ratio (ref 0.0–4.4)
Cholesterol, Total: 177 mg/dL (ref 100–199)
HDL: 77 mg/dL (ref >39)
LDL Chol Calc (NIH): 79 mg/dL (ref 0–99)
Triglycerides: 119 mg/dL (ref 0–149)
VLDL Cholesterol Cal: 21 mg/dL (ref 5–40)

## 2023-04-12 LAB — TSH+FREE T4
Free T4: 1.19 ng/dL (ref 0.82–1.77)
TSH: 2.33 u[IU]/mL (ref 0.450–4.500)

## 2023-04-12 LAB — HEMOGLOBIN A1C
Est. average glucose Bld gHb Est-mCnc: 131 mg/dL
Hgb A1c MFr Bld: 6.2 % — ABNORMAL HIGH (ref 4.8–5.6)

## 2023-04-12 LAB — VITAMIN D 25 HYDROXY (VIT D DEFICIENCY, FRACTURES): Vit D, 25-Hydroxy: 54.5 ng/mL (ref 30.0–100.0)

## 2023-04-12 NOTE — Addendum Note (Signed)
Addended by: [facility] Records on: 04/12/2023 05:10 PM   Modules accepted: Level of Service

## 2023-04-14 NOTE — Addendum Note (Signed)
Addended by: [facility] Records on: 04/14/2023 05:31 PM   Modules accepted: Level of Service

## 2023-04-17 ENCOUNTER — Other Ambulatory Visit: Payer: Self-pay | Admitting: Family Medicine

## 2023-04-17 ENCOUNTER — Encounter: Payer: Self-pay | Admitting: Family Medicine

## 2023-04-17 DIAGNOSIS — Z1231 Encounter for screening mammogram for malignant neoplasm of breast: Secondary | ICD-10-CM

## 2023-04-18 ENCOUNTER — Telehealth: Payer: Self-pay

## 2023-04-18 NOTE — Telephone Encounter (Signed)
Please assist patient with scheduling, thanks. 

## 2023-04-18 NOTE — Telephone Encounter (Signed)
Spoke to patient, scheduled patient in an "acute" slot per Dr. Claiborne Billings.    5/24 at 2:20PM

## 2023-04-18 NOTE — Telephone Encounter (Signed)
Patient called to request a transfer of care back to [facility] due to dissatisfaction from other provider. Her last appt with our office was 02/20/23.  Please confirm if ok to transfer and re-establish here?

## 2023-04-18 NOTE — Telephone Encounter (Signed)
Sure. Looks like she has been sick. If she would like to be seen this week please place her in an acute slot on Friday (only opening left since I am on vacation next week) and we will re-est her also.

## 2023-04-21 ENCOUNTER — Ambulatory Visit: Payer: BC Managed Care – PPO | Admitting: Family Medicine

## 2023-04-21 ENCOUNTER — Encounter: Payer: Self-pay | Admitting: Family Medicine

## 2023-04-21 VITALS — BP 114/73 | HR 65 | Temp 97.4°F | Ht 67.5 in | Wt 235.2 lb

## 2023-04-21 DIAGNOSIS — J40 Bronchitis, not specified as acute or chronic: Secondary | ICD-10-CM

## 2023-04-21 DIAGNOSIS — U071 COVID-19: Secondary | ICD-10-CM

## 2023-04-21 MED ORDER — FLUTICASONE-SALMETEROL 100-50 MCG/ACT IN AEPB
1.0000 | INHALATION_SPRAY | Freq: Two times a day (BID) | RESPIRATORY_TRACT | 3 refills | Status: DC
Start: 2023-04-21 — End: 2023-09-14

## 2023-04-21 MED ORDER — BENZONATATE 200 MG PO CAPS: 200.0000 mg | ORAL_CAPSULE | Freq: Two times a day (BID) | ORAL | 0 refills | Status: DC | PRN

## 2023-04-21 MED ORDER — FLUTICASONE-SALMETEROL 100-50 MCG/ACT IN AEPB: 1.0000 | INHALATION_SPRAY | Freq: Two times a day (BID) | RESPIRATORY_TRACT | 3 refills | Status: DC

## 2023-04-21 MED ORDER — DOXYCYCLINE HYCLATE 100 MG PO TABS: 100.0000 mg | ORAL_TABLET | Freq: Two times a day (BID) | ORAL | 0 refills | Status: DC

## 2023-04-21 NOTE — Progress Notes (Signed)
Patient ID: Cynthia [name], female  DOB: 12/17/1955, 67 y.o.   MRN: [id number] Patient Care Team    Relationship Specialty Notifications Start End  Natalia Leatherwood, DO PCP - General Family Medicine  04/21/23   Thurmon Fair, MD PCP - Cardiology Cardiology Admissions 11/30/18   Konrad Penta, MD Consulting Physician Gastroenterology  06/29/15   Venita Lick, MD Consulting Physician Orthopedic Surgery  04/13/16     Chief Complaint  Patient presents with   Establish Care    Been sick since March. Tested + for Covid in March as well. Been having a cough, feeling as if she has low O2, feeling tightness in chest, which is making it harder to breathe.     Subjective:  Cynthia [name] is a 67 y.o.  female present for new patient establishment. All past medical history, surgical history, allergies, family history, immunizations, medications and social history were updated in the electronic medical record today. All recent labs, ED visits and hospitalizations within the last year were reviewed.  She returns to clinic today for reestablishment.  She has been seen at another clinic to establish for an acute illness.  Patient was diagnosed with COVID in March since that time she has been fatigued and experienced a cough.  She states she has never been as ill as she has been the last couple months.  Approximately 2 weeks ago she was started on Z-Pak, prednisone and albuterol inhaler.  She states she has been using the albuterol about every 4 hours.  She continues to have a barking cough.  She has not had any additional fevers.  Initially she did have a low-grade fever with her COVID.  She had a recent chest x-ray which did not show any acute infections or concerns. She was also started on Protonix in the event GERD was causing recurrent cough as well.  She has never smoked.  No prior history of reactive airway disease. She continues to feel fatigued and although improved still having symptoms  of ear discomfort and respiratory upper and lower symptoms.     04/21/2023    2:32 PM 04/11/2023    8:22 AM 04/03/2023    8:13 AM 02/20/2023    9:25 AM 12/13/2021    3:01 PM  Depression screen PHQ 2/9  Decreased Interest 0 0 0 0 0  Down, Depressed, Hopeless 0 0 0 0 0  PHQ - 2 Score 0 0 0 0 0  Altered sleeping 0 0 0  0  Tired, decreased energy 0 0 1  0  Change in appetite 0 0 0  0  Feeling bad or failure about yourself  0 0 0  0  Trouble concentrating 0 0 0  0  Moving slowly or fidgety/restless 0 0 0  0  Suicidal thoughts 0 0 0  0  PHQ-9 Score 0 0 1  0  Difficult doing work/chores Not difficult at all Not difficult at all Not difficult at all        04/21/2023    2:32 PM 04/11/2023    8:22 AM 04/03/2023    8:13 AM 12/13/2021    3:01 PM  GAD 7 : Generalized Anxiety Score  Nervous, Anxious, on Edge 0 0 0 0  Control/stop worrying 0 0 0 0  Worry too much - different things 0 0 0 0  Trouble relaxing 0 0 0 0  Restless 0 0 0 0  Easily annoyed or irritable 0 0 0 0  Afraid - awful might happen 0 0 0 0  Total GAD 7 Score 0 0 0 0  Anxiety Difficulty Not difficult at all Not difficult at all Not difficult at all Not difficult at all          04/21/2023    2:31 PM 04/11/2023    8:22 AM 04/03/2023    8:13 AM 02/20/2023    9:25 AM 02/19/2023    9:48 AM  Fall Risk   Falls in the past year? 0 0 0 0 0  Number falls in past yr:  0 0 0   Injury with Fall?  0 0 0   Risk for fall due to :  No Fall Risks No Fall Risks    Follow up  Falls evaluation completed Falls evaluation completed Falls evaluation completed    Immunization History  Administered Date(s) Administered   Fluad Quad(high Dose 65+) 08/22/2022   Influenza Inj Mdck Quad Pf 09/07/2019   Influenza,inj,Quad PF,6+ Mos 09/15/2018, 09/22/2020   Influenza,inj,quad, With Preservative 09/07/2019   Influenza-Unspecified 07/21/2017, 09/15/2018   PFIZER(Purple Top)SARS-COV-2 Vaccination 02/28/2020, 03/27/2020   PNEUMOCOCCAL CONJUGATE-20  08/22/2022   Tdap 02/07/2008, 03/07/2018   Zoster Recombinat (Shingrix) 09/07/2019, 12/13/2019    No results found.  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 surveillance   Mitral valve prolapse    PONV (postoperative nausea and vomiting)    Primary osteoarthritis of right hip    Intraarticular steroid injection by Dr. Ethelene Hal 01/2016   Rectocele    SA node dysfunction (HCC)    No Known Allergies Past Surgical History:  Procedure Laterality Date   ABDOMINAL HYSTERECTOMY  05/1996   TVH--ovaries remain   ABDOMINAL SACROCOLPOPEXY N/A 06/13/2017   Procedure: ABDOMINO SACROCOLPOPEXY with Halbans culdoplasty;  Surgeon: Patton Salles, MD;  Location: [facility];  Service: Gynecology;  Laterality: N/A;   ANTERIOR AND POSTERIOR REPAIR N/A 06/13/2017   Procedure: ANTERIOR (CYSTOCELE) AND POSTERIOR REPAIR (RECTOCELE);  Surgeon: Patton Salles, MD;  Location: [facility];  Service: Gynecology;  Laterality: N/A;   arthroscopic knee Right    BACK SURGERY  2012   disc rupture   BILATERAL SALPINGECTOMY Bilateral 06/13/2017   Procedure: BILATERAL SALPINGECTOMY;  Surgeon: Patton Salles, MD;  Location: [facility];  Service: Gynecology;  Laterality: Bilateral;   BLADDER SUSPENSION N/A 06/13/2017   Procedure: TRANSVAGINAL TAPE (TVT) PROCEDURE;  Surgeon: Patton Salles, MD;  Location: [facility];  Service: Gynecology;  Laterality: N/A;   COLONOSCOPY W/ POLYPECTOMY  06/24/2015   Recall 5 yrs ([facility])   CYSTOSCOPY N/A 06/13/2017   Procedure: Bluford Kaufmann;  Surgeon: Patton Salles, MD;  Location: [facility];  Service:  Gynecology;  Laterality: N/A;   JOINT REPLACEMENT  June 13, 2022, June 18, 2021, June 2020   R Hip, L Hip, R Knee   KNEE RECONSTRUCTION Right 2008   LIVER BIOPSY  1997   fatty tumors   REPLACEMENT TOTAL KNEE Right 04/2019   SPINE SURGERY  2010  Family History  Problem Relation Age of Onset   Alcohol abuse Mother    CVA Mother        Dec 65   Heart disease Mother    Stroke Mother    Cancer Father        prostate   Arthritis Father    Alcohol abuse Sister    Heart disease Brother        congenital heart defect--Dec age 10 from MI   Early death Brother    Heart murmur Son    Heart attack Son    Heart murmur Son    Heart murmur Son    Cancer Maternal Aunt 47       Dec colon CA age 95   Cancer Paternal Grandfather    Cancer Cousin 65       Dec colon CA age 29   Early death Maternal Uncle    Vision loss Maternal Uncle    Varicose Veins Maternal Aunt    Social History   Social History Narrative   Ms. Brouse lives in [place] with her husband.  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 She has 3 grown sons. She is from [place].    Allergies as of 04/21/2023   No Known Allergies      Medication List        Accurate as of Apr 21, 2023  4:22 PM. If you have any questions, ask your nurse or doctor.          STOP taking these medications    azithromycin 250 MG tablet Commonly known as: ZITHROMAX Stopped by: Felix Pacini, DO       TAKE these medications    albuterol 108 (90 Base) MCG/ACT inhaler Commonly known as: VENTOLIN HFA Inhale 2 puffs into the lungs every 6 (six) hours as needed for wheezing or shortness of breath.   benzonatate 200 MG capsule Commonly known as: TESSALON Take 1 capsule (200 mg total) by mouth 2 (two) times daily as needed for cough. Started by: Felix Pacini, DO   celecoxib 200 MG capsule Commonly known as: CELEBREX Take 1 capsule (200 mg total) by mouth daily.   doxycycline 100  MG tablet Commonly known as: VIBRA-TABS Take 1 tablet (100 mg total) by mouth 2 (two) times daily. Started by: Felix Pacini, DO   escitalopram 10 MG tablet Commonly known as: LEXAPRO Take 1 tablet (10 mg total) by mouth daily. Needs appointment for further refills   fluticasone-salmeterol 100-50 MCG/ACT Aepb Commonly known as: ADVAIR Inhale 1 puff into the lungs 2 (two) times daily. Started by: Felix Pacini, DO   levothyroxine 25 MCG tablet Commonly known as: SYNTHROID Take 1 tablet (25 mcg total) by mouth daily before breakfast.   pantoprazole 20 MG tablet Commonly known as: Protonix Take 1 tablet (20 mg total) by mouth daily.   Vitamin D 125 MCG (5000 UT) Caps Take by mouth. Daily        All past medical history, surgical history, allergies, family history, immunizations andmedications were updated in the EMR today and reviewed under the history and medication portions of their EMR.     DG Chest 2 View Result Date: 04/14/2023 IMPRESSION: No active cardiopulmonary disease.  ROS 14 pt review of systems performed and negative (unless mentioned in an HPI)  Objective: BP 114/73   Pulse 65   Temp (!) 97.4 F (36.3 C)   Ht 5' 7.5" (1.715 m)  Wt 235 lb 3.2 oz (106.7 kg)   LMP 03/28/1996 (Within Months)   SpO2 97%   BMI 36.29 kg/m  Physical Exam Vitals and nursing note reviewed.  Constitutional:      General: She is not in acute distress.    Appearance: Normal appearance. She is normal weight. She is not ill-appearing or toxic-appearing.  HENT:     Head: Normocephalic and atraumatic.     Comments: Mild effusion present b/l    Right Ear: Tympanic membrane, ear canal and external ear normal. There is no impacted cerumen.     Left Ear: Tympanic membrane, ear canal and external ear normal. There is no impacted cerumen.     Nose: Congestion and rhinorrhea present.     Mouth/Throat:     Pharynx: No oropharyngeal exudate or posterior oropharyngeal erythema.  Eyes:      General: No scleral icterus.       Right eye: No discharge.        Left eye: No discharge.     Extraocular Movements: Extraocular movements intact.     Conjunctiva/sclera: Conjunctivae normal.     Pupils: Pupils are equal, round, and reactive to light.  Cardiovascular:     Rate and Rhythm: Normal rate and regular rhythm.     Heart sounds: No murmur heard. Pulmonary:     Effort: Pulmonary effort is normal. No respiratory distress.     Breath sounds: Rhonchi present. No wheezing or rales.  Musculoskeletal:     Right lower leg: No edema.     Left lower leg: No edema.  Skin:    Findings: No rash.  Neurological:     Mental Status: She is alert and oriented to person, place, and time. Mental status is at baseline.     Motor: No weakness.     Coordination: Coordination normal.     Gait: Gait normal.  Psychiatric:        Mood and Affect: Mood normal.        Behavior: Behavior normal.        Thought Content: Thought content normal.        Judgment: Judgment normal.      Assessment/plan: Cynthia [name] is a 67 y.o. female present for re-est Bronchitis due to COVID-19 virus Rest, hydrate.  Continue mucinex (DM if cough), nettie pot or nasal saline.  Doxy bid prescribed, take until completed.  Tessalon Perles for cough Continue albuterol every 6 hours as needed and tried to space out over time if able. Start Advair twice daily x 4 weeks.  It is not uncommon status post COVID to have inflammatory/reactive airway disease develop. We discussed COVID long-haul fatigue and cough. If cough present it can last up to 6-8 weeks.  F/U 3-4 weeks if not improved.     Return in about 4 weeks (around 05/19/2023), or if symptoms worsen or fail to improve.  No orders of the defined types were placed in this encounter.  Meds ordered this encounter  Medications   doxycycline (VIBRA-TABS) 100 MG tablet    Sig: Take 1 tablet (100 mg total) by mouth 2 (two) times daily.    Dispense:  20 tablet     Refill:  0   benzonatate (TESSALON) 200 MG capsule    Sig: Take 1 capsule (200 mg total) by mouth 2 (two) times daily as needed for cough.    Dispense:  30 capsule    Refill:  0   DISCONTD: fluticasone-salmeterol (ADVAIR) 100-50 MCG/ACT AEPB  Sig: Inhale 1 puff into the lungs 2 (two) times daily.    Dispense:  1 each    Refill:  3   fluticasone-salmeterol (ADVAIR) 100-50 MCG/ACT AEPB    Sig: Inhale 1 puff into the lungs 2 (two) times daily.    Dispense:  1 each    Refill:  3   Referral Orders  No referral(s) requested today     Note is dictated utilizing voice recognition software. Although note has been proof read prior to signing, occasional typographical errors still can be missed. If any questions arise, please do not hesitate to call for verification.  Electronically signed by: Felix Pacini, DO [facility]

## 2023-04-21 NOTE — Patient Instructions (Signed)
No follow-ups on file.        Great to see you today.  I have refilled the medication(s) we provide.   If labs were collected, we will inform you of lab results once received either by echart message or telephone call.   - echart message- for normal results that have been seen by the patient already.   - telephone call: abnormal results or if patient has not viewed results in their echart.  

## 2023-04-27 ENCOUNTER — Ambulatory Visit: Payer: BC Managed Care – PPO | Admitting: Family Medicine

## 2023-05-12 ENCOUNTER — Ambulatory Visit (HOSPITAL_COMMUNITY)
Admission: RE | Admit: 2023-05-12 | Discharge: 2023-05-12 | Disposition: A | Discharge status: Home / Self Care | Payer: BC Managed Care – PPO | Source: Ambulatory Visit | Attending: Family Medicine | Admitting: Family Medicine

## 2023-05-12 DIAGNOSIS — Z1231 Encounter for screening mammogram for malignant neoplasm of breast: Secondary | ICD-10-CM | POA: Insufficient documentation

## 2023-07-26 ENCOUNTER — Encounter: Payer: Self-pay | Admitting: Family Medicine

## 2023-07-26 MED ORDER — CELECOXIB 200 MG PO CAPS: 200.0000 mg | ORAL_CAPSULE | Freq: Every day | ORAL | 0 refills | Status: DC

## 2023-08-22 ENCOUNTER — Other Ambulatory Visit: Payer: Self-pay | Admitting: Family Medicine

## 2023-08-30 ENCOUNTER — Other Ambulatory Visit: Payer: Self-pay | Admitting: Family Medicine

## 2023-09-14 ENCOUNTER — Encounter: Payer: Self-pay | Admitting: Family Medicine

## 2023-09-14 ENCOUNTER — Ambulatory Visit: Payer: Medicare PPO | Admitting: Family Medicine

## 2023-09-14 VITALS — BP 108/72 | HR 51 | Temp 97.6°F | Ht 67.52 in | Wt 231.6 lb

## 2023-09-14 DIAGNOSIS — M5136 Other intervertebral disc degeneration, lumbar region with discogenic back pain only: Secondary | ICD-10-CM

## 2023-09-14 DIAGNOSIS — Z5181 Encounter for therapeutic drug level monitoring: Secondary | ICD-10-CM

## 2023-09-14 DIAGNOSIS — Z79899 Other long term (current) drug therapy: Secondary | ICD-10-CM

## 2023-09-14 DIAGNOSIS — Z23 Encounter for immunization: Secondary | ICD-10-CM | POA: Diagnosis not present

## 2023-09-14 DIAGNOSIS — Z0000 Encounter for general adult medical examination without abnormal findings: Secondary | ICD-10-CM | POA: Diagnosis not present

## 2023-09-14 DIAGNOSIS — E039 Hypothyroidism, unspecified: Secondary | ICD-10-CM

## 2023-09-14 DIAGNOSIS — K219 Gastro-esophageal reflux disease without esophagitis: Secondary | ICD-10-CM

## 2023-09-14 DIAGNOSIS — E559 Vitamin D deficiency, unspecified: Secondary | ICD-10-CM

## 2023-09-14 DIAGNOSIS — M159 Polyosteoarthritis, unspecified: Secondary | ICD-10-CM

## 2023-09-14 DIAGNOSIS — Z131 Encounter for screening for diabetes mellitus: Secondary | ICD-10-CM

## 2023-09-14 DIAGNOSIS — Z1231 Encounter for screening mammogram for malignant neoplasm of breast: Secondary | ICD-10-CM

## 2023-09-14 DIAGNOSIS — R12 Heartburn: Secondary | ICD-10-CM

## 2023-09-14 DIAGNOSIS — Z13220 Encounter for screening for lipoid disorders: Secondary | ICD-10-CM

## 2023-09-14 LAB — LIPID PANEL
Cholesterol: 191 mg/dL (ref 0–200)
HDL: 79.4 mg/dL (ref >39.00)
LDL Cholesterol: 94 mg/dL (ref 0–99)
NonHDL: 111.47
Total CHOL/HDL Ratio: 2
Triglycerides: 86 mg/dL (ref 0.0–149.0)
VLDL: 17.2 mg/dL (ref 0.0–40.0)

## 2023-09-14 LAB — COMPREHENSIVE METABOLIC PANEL
ALT: 22 U/L (ref 0–35)
AST: 16 U/L (ref 0–37)
Albumin: 4.6 g/dL (ref 3.5–5.2)
Alkaline Phosphatase: 97 U/L (ref 39–117)
BUN: 23 mg/dL (ref 6–23)
CO2: 28 meq/L (ref 19–32)
Calcium: 10 mg/dL (ref 8.4–10.5)
Chloride: 104 meq/L (ref 96–112)
Creatinine, Ser: 0.69 mg/dL (ref 0.40–1.20)
GFR: 90.12 mL/min (ref >60.00)
Glucose, Bld: 101 mg/dL — ABNORMAL HIGH (ref 70–99)
Potassium: 5 meq/L (ref 3.5–5.1)
Sodium: 142 meq/L (ref 135–145)
Total Bilirubin: 0.6 mg/dL (ref 0.2–1.2)
Total Protein: 6.9 g/dL (ref 6.0–8.3)

## 2023-09-14 LAB — TSH: TSH: 2.32 u[IU]/mL (ref 0.35–5.50)

## 2023-09-14 LAB — CBC
HCT: 43.9 % (ref 36.0–46.0)
Hemoglobin: 14.1 g/dL (ref 12.0–15.0)
MCHC: 32 g/dL (ref 30.0–36.0)
MCV: 94.2 fL (ref 78.0–100.0)
Platelets: 290 10*3/uL (ref 150.0–400.0)
RBC: 4.66 Mil/uL (ref 3.87–5.11)
RDW: 14.5 % (ref 11.5–15.5)
WBC: 7.2 10*3/uL (ref 4.0–10.5)

## 2023-09-14 LAB — VITAMIN D 25 HYDROXY (VIT D DEFICIENCY, FRACTURES): VITD: 47.55 ng/mL (ref 30.00–100.00)

## 2023-09-14 LAB — HEMOGLOBIN A1C: Hgb A1c MFr Bld: 5.9 % (ref 4.6–6.5)

## 2023-09-14 LAB — MAGNESIUM: Magnesium: 2.2 mg/dL (ref 1.5–2.5)

## 2023-09-14 MED ORDER — PANTOPRAZOLE SODIUM 20 MG PO TBEC
20.0000 mg | DELAYED_RELEASE_TABLET | Freq: Every day | ORAL | 3 refills | Status: DC
Start: 2023-09-14 — End: 2024-06-07

## 2023-09-14 MED ORDER — CELECOXIB 200 MG PO CAPS: 200.0000 mg | ORAL_CAPSULE | Freq: Every day | ORAL | 1 refills | Status: DC

## 2023-09-14 NOTE — Patient Instructions (Addendum)
No follow-ups on file.        Great to see you today.  I have refilled the medication(s) we provide.   If labs were collected or images ordered, we will inform you of  results once we have received them and reviewed. We will contact you either by echart message, or telephone call.  Please give ample time to the testing facility, and our office to run,  receive and review results. Please do not call inquiring of results, even if you can see them in your chart. We will contact you as soon as we are able. If it has been over 1 week since the test was completed, and you have not yet heard from [place], then please call us.    - echart message- for normal results that have been seen by the patient already.   - telephone call: abnormal results or if patient has not viewed results in their echart.  If a referral to a specialist was entered for you, please call us in 2 weeks if you have not heard from the specialist office to schedule.     If you are interested in weight loss counseling please make appt to discuss and bring with you 2 weeks of a food diary/log on notebook paper.  Food log is mandatory on first appt to proceed with counseling.  Weight loss counseling encompasses diet, exercise and can include  medications when appropriate and affordable.  There are routine appts for check-ins and weights to track progress and keep you on track.  Routine check-ins (in person) are also mandatory to continue with prescription refills. Check-in timeline  can range from 4 weeks to 12 weeks, depending on physician's recommendations and which step you are in of your weight loss journey.  Your BMI today is Body mass index is 35.72 kg/m.  Please check with your insurance prior to appt and ask them if they cover weight loss medications for your BMI? And if so, which medications. They may tell you some of the diabetes meds that are used for weight loss also,  are on your formulary- but this does not mean they  are covered for weight loss only.  Even if they tell with a prior auth it is covered- make sure they check to see if you personally meet criteria with your BMI.

## 2023-09-14 NOTE — Progress Notes (Signed)
Patient ID: [name], female  DOB: [birth year], 67 y.o.   MRN: [id number] Patient Care Team    Relationship Specialty Notifications Start End  Natalia Leatherwood, DO PCP - General Family Medicine  04/21/23   Thurmon Fair, MD PCP - Cardiology Cardiology Admissions 11/30/18   Konrad Penta, MD Consulting Physician Gastroenterology  06/29/15   Venita Lick, MD Consulting Physician Orthopedic Surgery  04/13/16     Chief Complaint  Patient presents with   Annual Exam    Pt is fasting    Subjective:  Cynthia [name] is a 67 y.o.  Female  present for CPE and Chronic Conditions/illness Management All past medical history, surgical history, allergies, family history, immunizations, medications and social history were updated in the electronic medical record today. All recent labs, ED visits and hospitalizations within the last year were reviewed.  Health maintenance:  Colonoscopy: UTD 05/2020, Dr. Jason Fila ([facility]). Polyps present. Pt states 5 year f/u.  Mammogram: completed 05/12/2023> ordered AP for 2025 Cervical cancer screening: Hysterectomy, Pap smear not indicated. Has GYN for pelvics. Immunizations: Tetanus UTD 2019, influenza completed today, PNA completed . shingrix series completed.  Infectious disease screening: HIV completed, hepatitis C completed. DEXA: 04/2018 UTD- normal Assistive device: None Oxygen use: None Patient has a Dental home. Hospitalizations/ED visits: Reviewed   Osteoarthritis of multiple joints, unspecified osteoarthritis type/Lumbar post-laminectomy syndrome/Degeneration of lumbar intervertebral disc Patient reports her symptoms are well controlled on celebrex QD  Hypothyroidism, unspecified type Patient reports she is compliance with levo 25 mcg qd on an empty stomach.      09/14/2023    9:43 AM 04/21/2023    2:32 PM 04/11/2023    8:22 AM 04/03/2023    8:13 AM 02/20/2023    9:25 AM  Depression screen PHQ 2/9  Decreased  Interest 0 0 0 0 0  Down, Depressed, Hopeless 0 0 0 0 0  PHQ - 2 Score 0 0 0 0 0  Altered sleeping 0 0 0 0   Tired, decreased energy 0 0 0 1   Change in appetite 0 0 0 0   Feeling bad or failure about yourself  0 0 0 0   Trouble concentrating 0 0 0 0   Moving slowly or fidgety/restless 0 0 0 0   Suicidal thoughts 0 0 0 0   PHQ-9 Score 0 0 0 1   Difficult doing work/chores Not difficult at all Not difficult at all Not difficult at all Not difficult at all       09/14/2023    9:44 AM 04/21/2023    2:32 PM 04/11/2023    8:22 AM 04/03/2023    8:13 AM  GAD 7 : Generalized Anxiety Score  Nervous, Anxious, on Edge 0 0 0 0  Control/stop worrying 0 0 0 0  Worry too much - different things 0 0 0 0  Trouble relaxing 0 0 0 0  Restless 0 0 0 0  Easily annoyed or irritable 0 0 0 0  Afraid - awful might happen 0 0 0 0  Total GAD 7 Score 0 0 0 0  Anxiety Difficulty Not difficult at all Not difficult at all Not difficult at all Not difficult at all    Immunization History  Administered Date(s) Administered   Fluad Quad(high Dose 65+) 08/22/2022   Fluad Trivalent(High Dose 65+) 09/14/2023   Influenza Inj Mdck Quad Pf 09/07/2019   Influenza,inj,Quad PF,6+ Mos 09/15/2018, 09/22/2020   Influenza,inj,quad, With Preservative  09/07/2019   Influenza-Unspecified 07/21/2017, 09/15/2018   PFIZER(Purple Top)SARS-COV-2 Vaccination 02/28/2020, 03/27/2020   PNEUMOCOCCAL CONJUGATE-20 08/22/2022   Tdap 02/07/2008, 03/07/2018   Zoster Recombinant(Shingrix) 09/07/2019, 12/13/2019    Past Medical History:  Diagnosis Date   Arthritis    Cancer (HCC)    skin on nose   Cervical disc disease    Cystocele    Heart murmur    History of adenomatous polyp of colon 06/24/2015   History of left hip replacement 05/14/2021   History of rheumatic fever    childhood   Hypothyroidism    Liver tumor (benign)    Mitral valve prolapse    PONV (postoperative nausea and vomiting)    Primary osteoarthritis of right  hip    Intraarticular steroid injection by Dr. Ethelene Hal 01/2016   Rectocele    SA node dysfunction (HCC)    No Known Allergies Past Surgical History:  Procedure Laterality Date   ABDOMINAL HYSTERECTOMY  05/1996   TVH--ovaries remain   ABDOMINAL SACROCOLPOPEXY N/A 06/13/2017   Procedure: ABDOMINO SACROCOLPOPEXY with Halbans culdoplasty;  Surgeon: Patton Salles, MD;  Location: [facility];  Service: Gynecology;  Laterality: N/A;   ANTERIOR AND POSTERIOR REPAIR N/A 06/13/2017   Procedure: ANTERIOR (CYSTOCELE) AND POSTERIOR REPAIR (RECTOCELE);  Surgeon: Patton Salles, MD;  Location: [facility];  Service: Gynecology;  Laterality: N/A;   arthroscopic knee Right    BACK SURGERY  2012   disc rupture   BILATERAL SALPINGECTOMY Bilateral 06/13/2017   Procedure: BILATERAL SALPINGECTOMY;  Surgeon: Patton Salles, MD;  Location: [facility];  Service: Gynecology;  Laterality: Bilateral;   BLADDER SUSPENSION N/A 06/13/2017   Procedure: TRANSVAGINAL TAPE (TVT) PROCEDURE;  Surgeon: Patton Salles, MD;  Location: [facility];  Service: Gynecology;  Laterality: N/A;   COLONOSCOPY W/ POLYPECTOMY  06/24/2015   Recall 5 yrs ([facility])   CYSTOSCOPY N/A 06/13/2017   Procedure: Bluford Kaufmann;  Surgeon: Patton Salles, MD;  Location: [facility];  Service: Gynecology;  Laterality: N/A;   JOINT REPLACEMENT  June 13, 2022, June 18, 2021, June 2020   R Hip, L Hip, R Knee   KNEE RECONSTRUCTION Right 2008   LIVER BIOPSY  1997   fatty tumors   REPLACEMENT TOTAL KNEE Right 04/2019   SPINE SURGERY  2010   Family History  Problem Relation Age of Onset   Alcohol abuse Mother    CVA Mother        Dec 65   Heart disease Mother    Stroke Mother    Cancer Father        prostate   Arthritis Father    Alcohol abuse Sister    Heart disease Brother        congenital heart defect--Dec age 22 from MI   Early death Brother    Heart murmur Son    Heart attack  Son    Heart murmur Son    Heart murmur Son    Cancer Maternal Aunt 51       Dec colon CA age 37   Cancer Paternal Grandfather    Cancer Cousin 65       Dec colon CA age 5   Early death Maternal Uncle    Vision loss Maternal Uncle    Varicose Veins Maternal Aunt    Social History   Social History Narrative   Cynthia [name] lives in [place] with her husband.  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 She has 3 grown sons. She is from [place].    Allergies as of 09/14/2023   No Known Allergies      Medication List        Accurate as of September 14, 2023 11:16 AM. If you have any questions, ask your nurse or doctor.          STOP taking these medications    albuterol 108 (90 Base) MCG/ACT inhaler Commonly known as: VENTOLIN HFA Stopped by: Felix Pacini   benzonatate 200 MG capsule Commonly known as: TESSALON Stopped by: Felix Pacini   doxycycline 100 MG tablet Commonly known as: VIBRA-TABS Stopped by: Felix Pacini   escitalopram 10 MG tablet Commonly known as: LEXAPRO Stopped by: Felix Pacini   Vitamin D 125 MCG (5000 UT) Caps Stopped by: Felix Pacini       TAKE these medications    celecoxib 200 MG capsule Commonly known as: CELEBREX Take 1 capsule (200 mg total) by mouth daily. What changed: how much to take Changed by: Felix Pacini   levothyroxine 25 MCG tablet Commonly known as: SYNTHROID Take 1 tablet (25 mcg total) by mouth daily before breakfast.   mometasone 0.1 % cream Commonly known as: ELOCON APPLY TO AFFECTED AREA AS NEEDED FOR ITCH DAILY   pantoprazole 20 MG tablet Commonly known as: Protonix Take 1 tablet (20 mg total) by mouth daily.   Wixela Inhub 100-50 MCG/ACT Aepb Generic drug: fluticasone-salmeterol Inhale 1 puff into the lungs 2 (two) times daily as needed. What changed: Another medication with the same name was removed. Continue taking this medication, and follow the  directions you see here. Changed by: Felix Pacini        All past medical history, surgical history, allergies, family history, immunizations andmedications were updated in the EMR today and reviewed under the history and medication portions of their EMR.     No results found for this or any previous visit (from the past 2160 hour(s)).  CT CARDIAC SCORING (SELF PAY ONLY) IMPRESSION: Coronary calcium score of 0.  IMPRESSION: No significant incidental findings.    ROS 14 pt review of systems performed and negative (unless mentioned in an HPI)  Objective: BP 108/72   Pulse (!) 51   Temp 97.6 F (36.4 C)   Ht 5' 7.52" (1.715 m)   Wt 231 lb 9.6 oz (105.1 kg)   LMP 03/28/1996 (Within Months)   SpO2 97%   BMI 35.72 kg/m  Physical Exam Vitals and nursing note reviewed.  Constitutional:      General: She is not in acute distress.    Appearance: Normal appearance. She is not ill-appearing or toxic-appearing.  HENT:     Head: Normocephalic and atraumatic.     Right Ear: Tympanic membrane, ear canal and external ear normal. There is no impacted cerumen.     Left Ear: Tympanic membrane, ear canal and external ear normal. There is no impacted cerumen.     Nose: No congestion or rhinorrhea.     Mouth/Throat:     Mouth: Mucous membranes are moist.     Pharynx: Oropharynx is clear. No oropharyngeal exudate or posterior oropharyngeal erythema.  Eyes:     General: No scleral icterus.       Right eye: No discharge.        Left eye: No discharge.     Extraocular Movements: Extraocular movements intact.     Conjunctiva/sclera:  Conjunctivae normal.     Pupils: Pupils are equal, round, and reactive to light.  Cardiovascular:     Rate and Rhythm: Normal rate and regular rhythm.     Pulses: Normal pulses.     Heart sounds: Normal heart sounds. No murmur heard.    No friction rub. No gallop.  Pulmonary:     Effort: Pulmonary effort is normal. No respiratory distress.     Breath sounds:  Normal breath sounds. No stridor. No wheezing, rhonchi or rales.  Chest:     Chest wall: No tenderness.  Abdominal:     General: Abdomen is flat. Bowel sounds are normal. There is no distension.     Palpations: Abdomen is soft. There is no mass.     Tenderness: There is no abdominal tenderness. There is no right CVA tenderness, left CVA tenderness, guarding or rebound.     Hernia: No hernia is present.  Musculoskeletal:        General: No swelling, tenderness or deformity. Normal range of motion.     Cervical back: Normal range of motion and neck supple. No rigidity or tenderness.     Right lower leg: No edema.     Left lower leg: No edema.  Lymphadenopathy:     Cervical: No cervical adenopathy.  Skin:    General: Skin is warm and dry.     Coloration: Skin is not jaundiced or pale.     Findings: No bruising, erythema, lesion or rash.  Neurological:     General: No focal deficit present.     Mental Status: She is alert and oriented to person, place, and time. Mental status is at baseline.     Cranial Nerves: No cranial nerve deficit.     Sensory: No sensory deficit.     Motor: No weakness.     Coordination: Coordination normal.     Gait: Gait normal.     Deep Tendon Reflexes: Reflexes normal.  Psychiatric:        Mood and Affect: Mood normal.        Behavior: Behavior normal.        Thought Content: Thought content normal.        Judgment: Judgment normal.      No results found.  Assessment/plan: Cynthia [name] is a 67 y.o. female present for CPE and Chronic Conditions/illness Management Hypothyroidism, unspecified type Stable Continue levothyroxine 25 mcg daily.  Dose will be refilled after results received and appropriate dose. TSH collected - Hemoglobin A1c - Lipid panel  Osteoarthritis of multiple joints, unspecified osteoarthritis type/Lumbar post-laminectomy syndrome/Degeneration of lumbar intervertebral disc Stable Continue  Celebrex every day   FH heart  disease:  Son MI at age 56.  Coronary Calcium score: ZERO, 2022  History of colon polyps 5 yr follow up.  Vitamin D deficiency - VITAMIN D 25 Hydroxy (Vit-D Deficiency, Fractures)  Breast cancer screening by mammogram - MM 3D SCREENING MAMMOGRAM BILATERAL BREAST; Future  Influenza vaccine needed - Flu Vaccine Trivalent High Dose (Fluad) Lipid screening - Lipid panel  Diabetes mellitus screening - Hemoglobin A1c  Heartburn/ Encounter for monitoring long-term proton pump inhibitor therapy stable - continue pantoprazole (PROTONIX) 20 MG tablet; Take 1 tablet (20 mg total) by mouth daily.  Dispense: 90 tablet; Refill: 3 - Magnesium   Routine general medical examination at a health care facility Patient was encouraged to exercise greater than 150 minutes a week. Patient was encouraged to choose a diet filled with fresh fruits and vegetables,  and lean meats. AVS provided to patient today for education/recommendation on gender specific health and safety maintenance. Colonoscopy: UTD 05/2020, Dr. Jason Fila ([facility]). Polyps present. Pt states 5 year f/u.  Mammogram: completed 05/12/2023> ordered AP for 2025 Cervical cancer screening: Hysterectomy, Pap smear not indicated. Has GYN for pelvics. Immunizations: Tetanus UTD 2019, influenza completed today, PNA completed . shingrix series completed.  Infectious disease screening: HIV completed, hepatitis C completed. DEXA: 04/2018 UTD- normal  Return in about 24 weeks (around 02/29/2024) for Routine chronic condition follow-up.   Orders Placed This Encounter  Procedures   MM 3D SCREENING MAMMOGRAM BILATERAL BREAST   Flu Vaccine Trivalent High Dose (Fluad)   Hemoglobin A1c   Comprehensive metabolic panel   Lipid panel   TSH   VITAMIN D 25 Hydroxy (Vit-D Deficiency, Fractures)   CBC   Magnesium   Meds ordered this encounter  Medications   celecoxib (CELEBREX) 200 MG capsule    Sig: Take 1 capsule (200 mg total) by  mouth daily.    Dispense:  90 capsule    Refill:  1   pantoprazole (PROTONIX) 20 MG tablet    Sig: Take 1 tablet (20 mg total) by mouth daily.    Dispense:  90 tablet    Refill:  3   Referral Orders  No referral(s) requested today     Electronically signed by: Felix Pacini, DO [facility] [place]

## 2023-09-15 ENCOUNTER — Other Ambulatory Visit: Payer: Self-pay | Admitting: Family Medicine

## 2023-09-15 MED ORDER — LEVOTHYROXINE SODIUM 25 MCG PO TABS: 25.0000 ug | ORAL_TABLET | Freq: Every day | ORAL | 3 refills | Status: DC

## 2023-09-20 DIAGNOSIS — M25552 Pain in left hip: Secondary | ICD-10-CM | POA: Insufficient documentation

## 2023-09-24 DIAGNOSIS — M7062 Trochanteric bursitis, left hip: Secondary | ICD-10-CM | POA: Insufficient documentation

## 2023-09-26 ENCOUNTER — Ambulatory Visit: Payer: BC Managed Care – PPO | Admitting: Family Medicine

## 2023-10-30 ENCOUNTER — Encounter: Payer: Self-pay | Admitting: Cardiovascular Disease

## 2023-11-01 ENCOUNTER — Encounter: Payer: Self-pay | Admitting: Cardiovascular Disease

## 2024-01-05 ENCOUNTER — Ambulatory Visit: Payer: Medicare PPO | Admitting: Internal Medicine

## 2024-01-05 ENCOUNTER — Encounter: Payer: Self-pay | Admitting: Internal Medicine

## 2024-01-05 VITALS — BP 122/60 | HR 58 | Ht 67.0 in | Wt 228.2 lb

## 2024-01-05 DIAGNOSIS — M159 Polyosteoarthritis, unspecified: Secondary | ICD-10-CM | POA: Diagnosis not present

## 2024-01-05 DIAGNOSIS — E039 Hypothyroidism, unspecified: Secondary | ICD-10-CM

## 2024-01-05 DIAGNOSIS — E559 Vitamin D deficiency, unspecified: Secondary | ICD-10-CM | POA: Diagnosis not present

## 2024-01-05 DIAGNOSIS — R232 Flushing: Secondary | ICD-10-CM | POA: Diagnosis not present

## 2024-01-05 DIAGNOSIS — E669 Obesity, unspecified: Secondary | ICD-10-CM

## 2024-01-05 DIAGNOSIS — K219 Gastro-esophageal reflux disease without esophagitis: Secondary | ICD-10-CM | POA: Diagnosis not present

## 2024-01-05 NOTE — Assessment & Plan Note (Signed)
 She is currently taking Lexapro  5 mg daily for symptom relief, which is effective.  No medication changes are indicated today.

## 2024-01-05 NOTE — Patient Instructions (Signed)
 It was a pleasure to see you today.  Thank you for giving us  the opportunity to be involved in your care.  Below is a brief recap of your visit and next steps.  We will plan to see you again in 6 months.  Summary You have established care today No medication changes were made I will order repeat labs to be completed at your convenience Follow up in 6 months

## 2024-01-05 NOTE — Assessment & Plan Note (Signed)
 She is prescribed levothyroxine  25 mcg daily.  Asymptomatic currently.  Repeat thyroid  studies ordered today.

## 2024-01-05 NOTE — Assessment & Plan Note (Signed)
 History of diffuse osteoarthritis with arthroplasty of both hips and right knee.  She has also had surgery on her lumbar spine.  Currently prescribed Celebrex  200 mg daily.  Repeat labs ordered today.

## 2024-01-05 NOTE — Progress Notes (Signed)
 New Patient Office Visit  Subjective    Patient ID: Cynthia [name], female    DOB: October 05, 1956  Age: 68 y.o. MRN: [id number]  CC:  Chief Complaint  Patient presents with   Establish Care    HPI Cynthia [name] presents to establish care.  She is a 68 year old woman who endorses a past medical history significant for diffuse osteoarthritis, hypothyroidism, GERD, and obesity.  She has most recently been followed by Dr. Catherine at [facility].  [name] reports feeling well today.  She endorses chronic right ear discomfort but is otherwise asymptomatic and has no acute concerns to discuss aside from desiring to establish care.  She is a retired education officer, museum.  Denies tobacco and illicit drug use.  She endorses social alcohol use.  Family medical history significant for early onset cardiovascular disease, CVA, prostate cancer, and alcohol abuse.  Chronic medical conditions and outstanding preventative care items discussed today are individually addressed in A/P below.   Outpatient Encounter Medications as of 01/05/2024  Medication Sig   celecoxib  (CELEBREX ) 200 MG capsule Take 1 capsule (200 mg total) by mouth daily.   levothyroxine  (SYNTHROID ) 25 MCG tablet Take 1 tablet (25 mcg total) by mouth daily before breakfast.   pantoprazole  (PROTONIX ) 20 MG tablet Take 1 tablet (20 mg total) by mouth daily.   [DISCONTINUED] fluticasone -salmeterol (WIXELA INHUB) 100-50 MCG/ACT AEPB Inhale 1 puff into the lungs 2 (two) times daily as needed.   [DISCONTINUED] mometasone  (ELOCON ) 0.1 % cream APPLY TO AFFECTED AREA AS NEEDED FOR ITCH DAILY   No facility-administered encounter medications on file as of 01/05/2024.    Past Medical History:  Diagnosis Date   Arthritis    Cancer (HCC)    skin on nose   Cervical disc disease    Cystocele    Heart murmur    History of adenomatous polyp of colon 06/24/2015   History of left hip replacement 05/14/2021   History of  rheumatic fever    childhood   Hypothyroidism    Liver tumor (benign)    Mitral valve prolapse    PONV (postoperative nausea and vomiting)    Primary osteoarthritis of right hip    Intraarticular steroid injection by Dr. Bonner 01/2016   Rectocele    SA node dysfunction [facility])     Past Surgical History:  Procedure Laterality Date   ABDOMINAL HYSTERECTOMY  05/1996   TVH--ovaries remain   ABDOMINAL SACROCOLPOPEXY N/A 06/13/2017   Procedure: ABDOMINO SACROCOLPOPEXY with Halbans culdoplasty;  Surgeon: Cathlyn JAYSON Nikki Bobie FORBES, MD;  Location: [facility];  Service: Gynecology;  Laterality: N/A;   ANTERIOR AND POSTERIOR REPAIR N/A 06/13/2017   Procedure: ANTERIOR (CYSTOCELE) AND POSTERIOR REPAIR (RECTOCELE);  Surgeon: Cathlyn JAYSON Nikki Bobie FORBES, MD;  Location: [facility];  Service: Gynecology;  Laterality: N/A;   arthroscopic knee Right    BACK SURGERY  2012   disc rupture   BILATERAL SALPINGECTOMY Bilateral 06/13/2017   Procedure: BILATERAL SALPINGECTOMY;  Surgeon: Cathlyn JAYSON Nikki Bobie FORBES, MD;  Location: [facility];  Service: Gynecology;  Laterality: Bilateral;   BLADDER SUSPENSION N/A 06/13/2017   Procedure: TRANSVAGINAL TAPE (TVT) PROCEDURE;  Surgeon: Cathlyn JAYSON Nikki Bobie FORBES, MD;  Location: [facility];  Service: Gynecology;  Laterality: N/A;   COLONOSCOPY W/ POLYPECTOMY  06/24/2015   Recall 5 yrs ([facility])   CYSTOSCOPY N/A 06/13/2017   Procedure: PHYLLIS;  Surgeon: Cathlyn JAYSON Nikki Bobie FORBES, MD;  Location: [facility];  Service: Gynecology;  Laterality: N/A;   JOINT REPLACEMENT  June 13, 2022, June 18, 2021, June 2020   R Hip, L Hip, R Knee   KNEE RECONSTRUCTION Right 2008   LIVER BIOPSY  1997   fatty tumors   REPLACEMENT TOTAL KNEE Right 04/2019   SPINE SURGERY  2010    Family History  Problem Relation Age of Onset   Alcohol abuse Mother    CVA Mother        Dec 65   Heart disease Mother    Stroke Mother    Cancer Father        prostate   Arthritis Father     Alcohol abuse Sister    Heart disease Brother        congenital heart defect--Dec age 31 from MI   Early death Brother    Heart murmur Son    Heart attack Son    Heart murmur Son    Heart murmur Son    Cancer Maternal Aunt 98       Dec colon CA age 73   Cancer Paternal Grandfather    Cancer Cousin 62       Dec colon CA age 90   Early death Maternal Uncle    Vision loss Maternal Uncle    Varicose Veins Maternal Aunt     Social History   Socioeconomic History   Marital status: Married    Spouse name: [name]   Number of children: 3   Years of education: Not on file   Highest education level: Master's degree (e.g., MA, MS, MEng, MEd, MSW, MBA)  Occupational History   Occupation: TEACHER    Employer: [employer]  Tobacco Use   Smoking status: Never    Passive exposure: Never   Smokeless tobacco: Never  Vaping Use   Vaping status: Never Used  Substance and Sexual Activity   Alcohol use: Yes    Alcohol/week: 6.0 standard drinks of alcohol    Types: 3 Cans of beer, 3 Standard drinks or equivalent per week   Drug use: No   Sexual activity: Yes    Birth control/protection: Surgical, Other-see comments    Comment: Hyst  Other Topics Concern   Not on file  Social History Narrative   Ms. Maharaj lives in [place] with her husband.  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 She has 3 grown sons. She is from [place] .   Social Drivers of Corporate Investment Banker Strain: Low Risk  (01/02/2024)   Overall Financial Resource Strain (CARDIA)    Difficulty of Paying Living Expenses: Not very hard  Food Insecurity: No Food Insecurity (01/02/2024)   Hunger Vital Sign    Worried About Running Out of Food in the Last Year: Never true    Ran Out of Food in the Last Year: Never true  Transportation Needs: No Transportation Needs (01/02/2024)   PRAPARE - Administrator, Civil Service (Medical): No    Lack of  Transportation (Non-Medical): No  Physical Activity: Sufficiently Active (01/02/2024)   Exercise Vital Sign    Days of Exercise per Week: 3 days    Minutes of Exercise per Session: 60 min  Stress: No Stress Concern Present (01/02/2024)   [employer] of Occupational Health - Occupational Stress Questionnaire    Feeling of Stress : Not at all  Social Connections: Socially Integrated (01/02/2024)   Social Connection and Isolation Panel [NHANES]  Frequency of Communication with Friends and Family: More than three times a week    Frequency of Social Gatherings with Friends and Family: Once a week    Attends Religious Services: More than 4 times per year    Active Member of [employer] or Organizations: Yes    Attends Banker Meetings: More than 4 times per year    Marital Status: Married  Catering Manager Violence: Unknown (03/03/2022)   Received from [facility], [facility]   HITS    Physically Hurt: Not on file    Insult or Talk Down To: Not on file    Threaten Physical Harm: Not on file    Scream or Curse: Not on file   Review of Systems  Constitutional:  Negative for chills and fever.  HENT:  Positive for ear pain (Chronic right ear discomfort). Negative for sore throat.   Respiratory:  Negative for cough and shortness of breath.   Cardiovascular:  Negative for chest pain, palpitations and leg swelling.  Gastrointestinal:  Negative for abdominal pain, blood in stool, constipation, diarrhea, nausea and vomiting.  Genitourinary:  Negative for dysuria and hematuria.  Musculoskeletal:  Negative for myalgias.  Skin:  Negative for itching and rash.  Neurological:  Negative for dizziness and headaches.  Psychiatric/Behavioral:  Negative for depression and suicidal ideas.    Objective    BP 122/60 (BP Location: Left Arm, Patient Position: Sitting, Cuff Size: Large)   Pulse (!) 58   Ht 5' 7 (1.702 m)   Wt 228 lb 3.2 oz (103.5 kg)   LMP 03/28/1996 (Within Months)   SpO2  97%   BMI 35.74 kg/m   Physical Exam Vitals reviewed.  Constitutional:      General: She is not in acute distress.    Appearance: Normal appearance. She is obese. She is not toxic-appearing.  HENT:     Head: Normocephalic and atraumatic.     Right Ear: Tympanic membrane, ear canal and external ear normal. There is no impacted cerumen.     Left Ear: Tympanic membrane, ear canal and external ear normal. There is no impacted cerumen.     Nose: Nose normal. No congestion or rhinorrhea.     Mouth/Throat:     Mouth: Mucous membranes are moist.     Pharynx: Oropharynx is clear. No oropharyngeal exudate or posterior oropharyngeal erythema.  Eyes:     General: No scleral icterus.    Extraocular Movements: Extraocular movements intact.     Conjunctiva/sclera: Conjunctivae normal.     Pupils: Pupils are equal, round, and reactive to light.  Cardiovascular:     Rate and Rhythm: Normal rate and regular rhythm.     Pulses: Normal pulses.     Heart sounds: Normal heart sounds. No murmur heard.    No friction rub. No gallop.  Pulmonary:     Effort: Pulmonary effort is normal.     Breath sounds: Normal breath sounds. No wheezing, rhonchi or rales.  Abdominal:     General: Abdomen is flat. Bowel sounds are normal. There is no distension.     Palpations: Abdomen is soft.     Tenderness: There is no abdominal tenderness.  Musculoskeletal:        General: No swelling. Normal range of motion.     Cervical back: Normal range of motion.     Right lower leg: No edema.     Left lower leg: No edema.  Lymphadenopathy:     Cervical: No cervical adenopathy.  Skin:    General: Skin is warm and dry.     Capillary Refill: Capillary refill takes less than 2 seconds.     Coloration: Skin is not jaundiced.  Neurological:     General: No focal deficit present.     Mental Status: She is alert and oriented to person, place, and time.  Psychiatric:        Mood and Affect: Mood normal.        Behavior:  Behavior normal.   Last CBC Lab Results  Component Value Date   WBC 7.2 09/14/2023   HGB 14.1 09/14/2023   HCT 43.9 09/14/2023   MCV 94.2 09/14/2023   MCH 29.8 04/11/2023   RDW 14.5 09/14/2023   PLT 290.0 09/14/2023   Last metabolic panel Lab Results  Component Value Date   GLUCOSE 101 (H) 09/14/2023   NA 142 09/14/2023   K 5.0 09/14/2023   CL 104 09/14/2023   CO2 28 09/14/2023   BUN 23 09/14/2023   CREATININE 0.69 09/14/2023   GFR 90.12 09/14/2023   CALCIUM 10.0 09/14/2023   PROT 6.9 09/14/2023   ALBUMIN 4.6 09/14/2023   LABGLOB 2.1 04/11/2023   AGRATIO 2.0 04/11/2023   BILITOT 0.6 09/14/2023   ALKPHOS 97 09/14/2023   AST 16 09/14/2023   ALT 22 09/14/2023   ANIONGAP 7 10/16/2017   Last lipids Lab Results  Component Value Date   CHOL 191 09/14/2023   HDL 79.40 09/14/2023   LDLCALC 94 09/14/2023   LDLDIRECT 113 12/17/2012   TRIG 86.0 09/14/2023   CHOLHDL 2 09/14/2023   Last hemoglobin A1c Lab Results  Component Value Date   HGBA1C 5.9 09/14/2023   Last thyroid  functions Lab Results  Component Value Date   TSH 2.32 09/14/2023   T4TOTAL 6.2 05/24/2017   Last vitamin D  Lab Results  Component Value Date   VD25OH 47.55 09/14/2023   Assessment & Plan:   Problem List Items Addressed This Visit       Vasomotor flushing   She is currently taking Lexapro  5 mg daily for symptom relief, which is effective.  No medication changes are indicated today.      GERD (gastroesophageal reflux disease) - Primary (Chronic)   She is currently prescribed Protonix  20 mg daily.  No medication changes are indicated today.      Hypothyroidism (Chronic)   She is prescribed levothyroxine  25 mcg daily.  Asymptomatic currently.  Repeat thyroid  studies ordered today.      Osteoarthritis (Chronic)   History of diffuse osteoarthritis with arthroplasty of both hips and right knee.  She has also had surgery on her lumbar spine.  Currently prescribed Celebrex  200 mg daily.   Repeat labs ordered today.      Vitamin D  deficiency (Chronic)   Noted on previous labs.  Repeat vitamin D  level ordered today.       Return in about 6 months (around 07/04/2024).   Manus FORBES Fireman, MD

## 2024-01-05 NOTE — Assessment & Plan Note (Signed)
 Noted on previous labs.  Repeat vitamin D level ordered today

## 2024-01-05 NOTE — Assessment & Plan Note (Signed)
 She is currently prescribed Protonix  20 mg daily.  No medication changes are indicated today.

## 2024-02-08 DIAGNOSIS — E669 Obesity, unspecified: Secondary | ICD-10-CM | POA: Diagnosis not present

## 2024-02-08 DIAGNOSIS — K219 Gastro-esophageal reflux disease without esophagitis: Secondary | ICD-10-CM | POA: Diagnosis not present

## 2024-02-08 DIAGNOSIS — E559 Vitamin D deficiency, unspecified: Secondary | ICD-10-CM | POA: Diagnosis not present

## 2024-02-08 DIAGNOSIS — E039 Hypothyroidism, unspecified: Secondary | ICD-10-CM | POA: Diagnosis not present

## 2024-02-09 ENCOUNTER — Encounter: Payer: Self-pay | Admitting: Internal Medicine

## 2024-02-09 LAB — CMP14+EGFR
ALT: 19 IU/L (ref 0–32)
AST: 18 IU/L (ref 0–40)
Albumin: 4.7 g/dL (ref 3.9–4.9)
Alkaline Phosphatase: 107 IU/L (ref 44–121)
BUN/Creatinine Ratio: 24 (ref 12–28)
BUN: 19 mg/dL (ref 8–27)
Bilirubin Total: 0.4 mg/dL (ref 0.0–1.2)
CO2: 19 mmol/L — ABNORMAL LOW (ref 20–29)
Calcium: 9.4 mg/dL (ref 8.7–10.3)
Chloride: 106 mmol/L (ref 96–106)
Creatinine, Ser: 0.79 mg/dL (ref 0.57–1.00)
Globulin, Total: 1.9 g/dL (ref 1.5–4.5)
Glucose: 108 mg/dL — ABNORMAL HIGH (ref 70–99)
Potassium: 5 mmol/L (ref 3.5–5.2)
Sodium: 142 mmol/L (ref 134–144)
Total Protein: 6.6 g/dL (ref 6.0–8.5)
eGFR: 82 mL/min/{1.73_m2} (ref >59)

## 2024-02-09 LAB — LIPID PANEL
Chol/HDL Ratio: 2.5 ratio (ref 0.0–4.4)
Cholesterol, Total: 184 mg/dL (ref 100–199)
HDL: 73 mg/dL (ref >39)
LDL Chol Calc (NIH): 88 mg/dL (ref 0–99)
Triglycerides: 131 mg/dL (ref 0–149)
VLDL Cholesterol Cal: 23 mg/dL (ref 5–40)

## 2024-02-09 LAB — CBC WITH DIFFERENTIAL/PLATELET
Basophils Absolute: 0 10*3/uL (ref 0.0–0.2)
Basos: 1 %
EOS (ABSOLUTE): 0.2 10*3/uL (ref 0.0–0.4)
Eos: 3 %
Hematocrit: 40.6 % (ref 34.0–46.6)
Hemoglobin: 13.4 g/dL (ref 11.1–15.9)
Immature Grans (Abs): 0 10*3/uL (ref 0.0–0.1)
Immature Granulocytes: 0 %
Lymphocytes Absolute: 2.4 10*3/uL (ref 0.7–3.1)
Lymphs: 41 %
MCH: 30.3 pg (ref 26.6–33.0)
MCHC: 33 g/dL (ref 31.5–35.7)
MCV: 92 fL (ref 79–97)
Monocytes Absolute: 0.4 10*3/uL (ref 0.1–0.9)
Monocytes: 7 %
Neutrophils Absolute: 2.9 10*3/uL (ref 1.4–7.0)
Neutrophils: 48 %
Platelets: 254 10*3/uL (ref 150–450)
RBC: 4.42 x10E6/uL (ref 3.77–5.28)
RDW: 12.6 % (ref 11.7–15.4)
WBC: 6 10*3/uL (ref 3.4–10.8)

## 2024-02-09 LAB — TSH+FREE T4
Free T4: 0.94 ng/dL (ref 0.82–1.77)
TSH: 3.72 u[IU]/mL (ref 0.450–4.500)

## 2024-02-09 LAB — B12 AND FOLATE PANEL
Folate: >20 ng/mL (ref >3.0)
Vitamin B-12: 353 pg/mL (ref 232–1245)

## 2024-02-09 LAB — VITAMIN D 25 HYDROXY (VIT D DEFICIENCY, FRACTURES): Vit D, 25-Hydroxy: 59.3 ng/mL (ref 30.0–100.0)

## 2024-02-09 LAB — HEMOGLOBIN A1C
Est. average glucose Bld gHb Est-mCnc: 120 mg/dL
Hgb A1c MFr Bld: 5.8 % — ABNORMAL HIGH (ref 4.8–5.6)

## 2024-02-27 ENCOUNTER — Ambulatory Visit
Admission: RE | Admit: 2024-02-27 | Discharge: 2024-02-27 | Disposition: A | Discharge status: Home / Self Care | Source: Ambulatory Visit | Attending: Family Medicine | Admitting: Family Medicine

## 2024-02-27 VITALS — BP 138/76 | HR 77 | Temp 98.3°F | Resp 20

## 2024-02-27 DIAGNOSIS — J069 Acute upper respiratory infection, unspecified: Secondary | ICD-10-CM | POA: Diagnosis not present

## 2024-02-27 LAB — POC COVID19/FLU A&B COMBO
Covid Antigen, POC: NEGATIVE
Influenza A Antigen, POC: NEGATIVE
Influenza B Antigen, POC: NEGATIVE

## 2024-02-27 LAB — POCT RAPID STREP A (OFFICE): Rapid Strep A Screen: NEGATIVE

## 2024-02-27 MED ORDER — PROMETHAZINE-DM 6.25-15 MG/5ML PO SYRP: 5.0000 mL | ORAL_SOLUTION | Freq: Four times a day (QID) | ORAL | 0 refills | Status: DC | PRN

## 2024-02-27 MED ORDER — AZELASTINE HCL 0.1 % NA SOLN: 1.0000 | Freq: Two times a day (BID) | NASAL | 0 refills | Status: DC

## 2024-02-27 NOTE — ED Provider Notes (Signed)
 [facility]    CSN: [id number] Arrival date & time: 02/27/24  0957      History   Chief Complaint Chief Complaint  Patient presents with   Sore Throat    Slight feverWhite puss in throatGreen phlegm Joints acheHeadache - Entered by patient    HPI Cynthia [name] is a 68 y.o. female.   Patient presenting today with 2-day history of sore throat, congestion, ear pain and pressure, cough, generalized bodyaches.  Denies fever, chills, chest pain, shortness of breath, abdominal pain, vomiting, diarrhea.  So far not trying anything over-the-counter for symptoms.  No known history of chronic pulmonary disease.   Past Medical History:  Diagnosis Date   Arthritis    Cancer (HCC)    skin on nose   Cervical disc disease    Cystocele    Heart murmur    History of adenomatous polyp of colon 06/24/2015   History of left hip replacement 05/14/2021   History of rheumatic fever    childhood   Hypothyroidism    Liver tumor (benign)    Mitral valve prolapse    PONV (postoperative nausea and vomiting)    Primary osteoarthritis of right hip    Intraarticular steroid injection by Dr. 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   Hypothyroidism    Lumbar post-laminectomy syndrome 11/01/2018   Degeneration of lumbar intervertebral disc 11/01/2018   MVP (mitral valve prolapse) 03/14/2017   Family history of aortic aneurysm 03/14/2017   Osteoarthritis 05/25/2016   Obesity (BMI 30-39.9) 05/25/2016   Vitamin D deficiency 05/12/2015   History of colon polyps 05/27/2014   Vasomotor flushing 03/17/2014   Personal history of cardiac arrhythmia 03/17/2014    Past Surgical History:  Procedure Laterality Date   ABDOMINAL HYSTERECTOMY  05/1996   TVH--ovaries remain   ABDOMINAL SACROCOLPOPEXY N/A 06/13/2017   Procedure:  ABDOMINO SACROCOLPOPEXY with Halbans culdoplasty;  Surgeon: Patton Salles, MD;  Location: [facility];  Service: Gynecology;  Laterality: N/A;   ANTERIOR AND POSTERIOR REPAIR N/A 06/13/2017   Procedure: ANTERIOR (CYSTOCELE) AND POSTERIOR REPAIR (RECTOCELE);  Surgeon: Patton Salles, MD;  Location: [facility];  Service: Gynecology;  Laterality: N/A;   arthroscopic knee Right    BACK SURGERY  2012   disc rupture   BILATERAL SALPINGECTOMY Bilateral 06/13/2017   Procedure: BILATERAL SALPINGECTOMY;  Surgeon: Patton Salles, MD;  Location: [facility];  Service: Gynecology;  Laterality: Bilateral;   BLADDER SUSPENSION N/A 06/13/2017   Procedure: TRANSVAGINAL TAPE (TVT) PROCEDURE;  Surgeon: Patton Salles, MD;  Location: [facility];  Service: Gynecology;  Laterality: N/A;   COLONOSCOPY W/ POLYPECTOMY  06/24/2015   Recall 5 yrs ([facility])   CYSTOSCOPY N/A 06/13/2017   Procedure: Bluford Kaufmann;  Surgeon: Patton Salles, MD;  Location: [facility];  Service: Gynecology;  Laterality: N/A;   JOINT REPLACEMENT  June 13, 2022, June 18, 2021, June 2020   R Hip, L Hip, R Knee   KNEE RECONSTRUCTION Right 2008   LIVER BIOPSY  1997   fatty tumors   REPLACEMENT TOTAL KNEE Right 04/2019   SPINE SURGERY  2010    OB History     Gravida  3   Para  3   Term  Preterm      AB      Living  3      SAB      IAB      Ectopic      Multiple      Live Births  3            Home Medications    Prior to Admission medications   Medication Sig Start Date End Date Taking? Authorizing Provider  azelastine (ASTELIN) 0.1 % nasal spray Place 1 spray into both nostrils 2 (two) times daily. Use in each nostril as directed 02/27/24  Yes Particia Nearing, PA-C  promethazine-dextromethorphan (PROMETHAZINE-DM) 6.25-15 MG/5ML syrup Take 5 mLs by mouth 4 (four) times daily as needed. 02/27/24  Yes Particia Nearing, PA-C  celecoxib  (CELEBREX) 200 MG capsule Take 1 capsule (200 mg total) by mouth daily. 09/14/23   Kuneff, Renee A, DO  levothyroxine (SYNTHROID) 25 MCG tablet Take 1 tablet (25 mcg total) by mouth daily before breakfast. 09/15/23   Kuneff, Renee A, DO  pantoprazole (PROTONIX) 20 MG tablet Take 1 tablet (20 mg total) by mouth daily. 09/14/23   Natalia Leatherwood, DO    Family History Family History  Problem Relation Age of Onset   Alcohol abuse Mother    CVA Mother        Dec 65   Heart disease Mother    Stroke Mother    Cancer Father        prostate   Arthritis Father    Alcohol abuse Sister    Heart disease Brother        congenital heart defect--Dec age 42 from MI   Early death Brother    Heart murmur Son    Heart attack Son    Heart murmur Son    Heart murmur Son    Cancer Maternal Aunt 75       Dec colon CA age 45   Cancer Paternal Grandfather    Cancer Cousin 44       Dec colon CA age 85   Early death Maternal Uncle    Vision loss Maternal Uncle    Varicose Veins Maternal Aunt     Social History Social History   Tobacco Use   Smoking status: Never    Passive exposure: Never   Smokeless tobacco: Never  Vaping Use   Vaping status: Never Used  Substance Use Topics   Alcohol use: Yes    Alcohol/week: 6.0 standard drinks of alcohol    Types: 3 Cans of beer, 3 Standard drinks or equivalent per week   Drug use: No    Allergies   Patient has no known allergies.   Review of Systems Review of Systems Per HPI  Physical Exam Triage Vital Signs ED Triage Vitals [02/27/24 1001]  Encounter Vitals Group     BP 138/76     Systolic BP Percentile      Diastolic BP Percentile      Pulse Rate 77     Resp 20     Temp 98.3 F (36.8 C)     Temp Source Oral     SpO2 95 %     Weight      Height      Head Circumference      Peak Flow      Pain Score 5     Pain Loc      Pain Education      Exclude from Growth Chart  No data found.  Updated Vital Signs BP 138/76 (BP  Location: Right Arm)   Pulse 77   Temp 98.3 F (36.8 C) (Oral)   Resp 20   LMP 03/28/1996 (Within Months)   SpO2 95%   Visual Acuity Right Eye Distance:   Left Eye Distance:   Bilateral Distance:    Right Eye Near:   Left Eye Near:    Bilateral Near:     Physical Exam Vitals and nursing note reviewed.  Constitutional:      Appearance: Normal appearance.  HENT:     Head: Atraumatic.     Right Ear: Tympanic membrane and external ear normal.     Left Ear: Tympanic membrane and external ear normal.     Nose: Rhinorrhea present.     Mouth/Throat:     Mouth: Mucous membranes are moist.     Pharynx: Posterior oropharyngeal erythema present.  Eyes:     Extraocular Movements: Extraocular movements intact.     Conjunctiva/sclera: Conjunctivae normal.  Cardiovascular:     Rate and Rhythm: Normal rate and regular rhythm.     Heart sounds: Normal heart sounds.  Pulmonary:     Effort: Pulmonary effort is normal.     Breath sounds: Normal breath sounds. No wheezing or rales.  Musculoskeletal:        General: Normal range of motion.     Cervical back: Normal range of motion and neck supple.  Skin:    General: Skin is warm and dry.  Neurological:     Mental Status: She is alert and oriented to person, place, and time.  Psychiatric:        Mood and Affect: Mood normal.        Thought Content: Thought content normal.    UC Treatments / Results  Labs (all labs ordered are listed, but only abnormal results are displayed) Labs Reviewed  POCT RAPID STREP A (OFFICE)  POC COVID19/FLU A&B COMBO   EKG  Radiology No results found.  Procedures Procedures (including critical care time)  Medications Ordered in UC Medications - No data to display  Initial Impression / Assessment and Plan / UC Course  I have reviewed the triage vital signs and the nursing notes.  Pertinent labs & imaging results that were available during my care of the patient were reviewed by me and considered  in my medical decision making (see chart for details).     Rapid flu and COVID as well as rapid strep all negative today.  Suspect viral respiratory infection.  Treat with Astelin, Phenergan DM, supportive over-the-counter medications and home care.  Return for worsening symptoms.  Final Clinical Impressions(s) / UC Diagnoses   Final diagnoses:  Viral URI with cough   Discharge Instructions   None    ED Prescriptions     Medication Sig Dispense Auth. Provider   azelastine (ASTELIN) 0.1 % nasal spray Place 1 spray into both nostrils 2 (two) times daily. Use in each nostril as directed 30 mL Particia Nearing, PA-C   promethazine-dextromethorphan (PROMETHAZINE-DM) 6.25-15 MG/5ML syrup Take 5 mLs by mouth 4 (four) times daily as needed. 100 mL Particia Nearing, [place]      PDMP not reviewed this encounter.   Chakira, Jachim, [place] 02/27/24 1140

## 2024-02-27 NOTE — ED Triage Notes (Signed)
 Pt reports she has throat pain, nasal congestion, sore ears, and body aches x 2 days

## 2024-02-29 ENCOUNTER — Other Ambulatory Visit: Payer: Self-pay | Admitting: Family Medicine

## 2024-03-01 ENCOUNTER — Ambulatory Visit: Payer: Medicare PPO | Admitting: Family Medicine

## 2024-03-03 ENCOUNTER — Encounter: Payer: Self-pay | Admitting: Internal Medicine

## 2024-03-04 ENCOUNTER — Telehealth: Admitting: Physician Assistant

## 2024-03-04 DIAGNOSIS — B9689 Other specified bacterial agents as the cause of diseases classified elsewhere: Secondary | ICD-10-CM | POA: Diagnosis not present

## 2024-03-04 DIAGNOSIS — J329 Chronic sinusitis, unspecified: Secondary | ICD-10-CM

## 2024-03-04 MED ORDER — AMOXICILLIN-POT CLAVULANATE 875-125 MG PO TABS
1.0000 | ORAL_TABLET | Freq: Two times a day (BID) | ORAL | 0 refills | Status: DC
Start: 2024-03-04 — End: 2024-03-14

## 2024-03-04 NOTE — Progress Notes (Signed)
 Virtual Visit Consent   [name], you are scheduled for a virtual visit with a [facility] provider today. Just as with appointments in the office, your consent must be obtained to participate. Your consent will be active for this visit and any virtual visit you may have with one of our providers in the next 365 days. If you have a MyChart account, a copy of this consent can be sent to you electronically.  As this is a virtual visit, video technology does not allow for your provider to perform a traditional examination. This may limit your provider's ability to fully assess your condition. If your provider identifies any concerns that need to be evaluated in person or the need to arrange testing (such as labs, EKG, etc.), we will make arrangements to do so. Although advances in technology are sophisticated, we cannot ensure that it will always work on either your end or our end. If the connection with a video visit is poor, the visit may have to be switched to a telephone visit. With either a video or telephone visit, we are not always able to ensure that we have a secure connection.  By engaging in this virtual visit, you consent to the provision of healthcare and authorize for your insurance to be billed (if applicable) for the services provided during this visit. Depending on your insurance coverage, you may receive a charge related to this service.  I need to obtain your verbal consent now. Are you willing to proceed with your visit today? Cynthia [name] has provided verbal consent on 03/04/2024 for a virtual visit (video or telephone). Margaretann Loveless, PA-C  Date: 03/04/2024 10:27 AM   Virtual Visit via Video Note   I, Margaretann Loveless, connected with  Cynthia [name]  ([id number], 1956/07/14) on 03/04/24 at 10:30 AM EDT by a video-enabled telemedicine application and verified that I am speaking with the correct person using two identifiers.  Location: Patient: Virtual  Visit Location Patient: Home Provider: Virtual Visit Location Provider: Home Office   I discussed the limitations of evaluation and management by telemedicine and the availability of in person appointments. The patient expressed understanding and agreed to proceed.    History of Present Illness: Cynthia [name] is a 68 y.o. who identifies as a female who was assigned female at birth, and is being seen today for sinus congestion.  HPI: Sinusitis This is a new problem. The current episode started 1 to 4 weeks ago (Seen at [facility] on 02/27/24 and diagnosed with Viral URI). The problem has been gradually worsening since onset. There has been no fever. The pain is moderate. Associated symptoms include congestion, coughing (productive dark green), ear pain (fullness, decreased hearing), headaches, sinus pressure and a sore throat. Pertinent negatives include no chills, diaphoresis, hoarse voice, shortness of breath or swollen glands. (Eye discharge from tear ducts from sinus congestion, aching, fatigue) Treatments tried: Mucinex DM, Promethazine DM, Azelastine. The treatment provided no relief.     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   Hypothyroidism    Lumbar post-laminectomy syndrome 11/01/2018   Degeneration of lumbar intervertebral disc 11/01/2018   MVP (mitral valve prolapse) 03/14/2017   Family history of aortic aneurysm 03/14/2017   Osteoarthritis 05/25/2016   Obesity (BMI 30-39.9) 05/25/2016   Vitamin D deficiency 05/12/2015   History of colon polyps 05/27/2014   Vasomotor flushing 03/17/2014  Personal history of cardiac arrhythmia 03/17/2014    Allergies: No Known Allergies Medications:  Current Outpatient Medications:    amoxicillin-clavulanate (AUGMENTIN) 875-125 MG tablet, Take 1 tablet by mouth 2 (two) times daily., Disp: 20 tablet, Rfl: 0   azelastine (ASTELIN)  0.1 % nasal spray, Place 1 spray into both nostrils 2 (two) times daily. Use in each nostril as directed, Disp: 30 mL, Rfl: 0   celecoxib (CELEBREX) 200 MG capsule, Take 1 capsule (200 mg total) by mouth daily., Disp: 90 capsule, Rfl: 1   levothyroxine (SYNTHROID) 25 MCG tablet, Take 1 tablet (25 mcg total) by mouth daily before breakfast., Disp: 90 tablet, Rfl: 3   pantoprazole (PROTONIX) 20 MG tablet, Take 1 tablet (20 mg total) by mouth daily., Disp: 90 tablet, Rfl: 3   promethazine-dextromethorphan (PROMETHAZINE-DM) 6.25-15 MG/5ML syrup, Take 5 mLs by mouth 4 (four) times daily as needed., Disp: 100 mL, Rfl: 0  Observations/Objective: Patient is well-developed, well-nourished in no acute distress.  Resting comfortably at home.  Head is normocephalic, atraumatic.  No labored breathing.  Speech is clear and coherent with logical content.  Patient is alert and oriented at baseline.    Assessment and Plan: 1. Bacterial sinusitis (Primary) - amoxicillin-clavulanate (AUGMENTIN) 875-125 MG tablet; Take 1 tablet by mouth 2 (two) times daily.  Dispense: 20 tablet; Refill: 0  - Worsening symptoms that have not responded to OTC medications.  - Will give Augmentin - Continue allergy medications.  - Can continue Mucinex DM - Steam and humidifier can help - Stay well hydrated and get plenty of rest.  - Seek in person evaluation if no symptom improvement or if symptoms worsen   Follow Up Instructions: I discussed the assessment and treatment plan with the patient. The patient was provided an opportunity to ask questions and all were answered. The patient agreed with the plan and demonstrated an understanding of the instructions.  A copy of instructions were sent to the patient via MyChart unless otherwise noted below.    The patient was advised to call back or seek an in-person evaluation if the symptoms worsen or if the condition fails to improve as anticipated.    Margaretann Loveless,  PA-C

## 2024-03-04 NOTE — Patient Instructions (Signed)
 Cynthia [name], thank you for joining Margaretann Loveless, PA-C for today's virtual visit.  While this provider is not your primary care provider (PCP), if your PCP is located in our provider database this encounter information will be shared with them immediately following your visit.   A [facility] MyChart account gives you access to today's visit and all your visits, tests, and labs performed at [facility] " click here if you don't have a [facility] MyChart account or go to [email or web address]  Consent: (Patient) Cynthia [name] provided verbal consent for this virtual visit at the beginning of the encounter.  Current Medications:  Current Outpatient Medications:    amoxicillin-clavulanate (AUGMENTIN) 875-125 MG tablet, Take 1 tablet by mouth 2 (two) times daily., Disp: 20 tablet, Rfl: 0   azelastine (ASTELIN) 0.1 % nasal spray, Place 1 spray into both nostrils 2 (two) times daily. Use in each nostril as directed, Disp: 30 mL, Rfl: 0   celecoxib (CELEBREX) 200 MG capsule, Take 1 capsule (200 mg total) by mouth daily., Disp: 90 capsule, Rfl: 1   levothyroxine (SYNTHROID) 25 MCG tablet, Take 1 tablet (25 mcg total) by mouth daily before breakfast., Disp: 90 tablet, Rfl: 3   pantoprazole (PROTONIX) 20 MG tablet, Take 1 tablet (20 mg total) by mouth daily., Disp: 90 tablet, Rfl: 3   promethazine-dextromethorphan (PROMETHAZINE-DM) 6.25-15 MG/5ML syrup, Take 5 mLs by mouth 4 (four) times daily as needed., Disp: 100 mL, Rfl: 0   Medications ordered in this encounter:  Meds ordered this encounter  Medications   amoxicillin-clavulanate (AUGMENTIN) 875-125 MG tablet    Sig: Take 1 tablet by mouth 2 (two) times daily.    Dispense:  20 tablet    Refill:  0    Supervising Provider:   Merrilee Jansky [[id number]]     *If you need refills on other medications prior to your next appointment, please contact your pharmacy*  Follow-Up: Call back or seek an in-person  evaluation if the symptoms worsen or if the condition fails to improve as anticipated.  [facility] [phone]  Other Instructions Sinus Infection, Adult A sinus infection, also called sinusitis, is inflammation of your sinuses. Sinuses are hollow spaces in the bones around your face. Your sinuses are located: Around your eyes. In the middle of your forehead. Behind your nose. In your cheekbones. Mucus normally drains out of your sinuses. When your nasal tissues become inflamed or swollen, mucus can become trapped or blocked. This allows bacteria, viruses, and fungi to grow, which leads to infection. Most infections of the sinuses are caused by a virus. A sinus infection can develop quickly. It can last for up to 4 weeks (acute) or for more than 12 weeks (chronic). A sinus infection often develops after a cold. What are the causes? This condition is caused by anything that creates swelling in the sinuses or stops mucus from draining. This includes: Allergies. Asthma. Infection from bacteria or viruses. Deformities or blockages in your nose or sinuses. Abnormal growths in the nose (nasal polyps). Pollutants, such as chemicals or irritants in the air. Infection from fungi. This is rare. What increases the risk? You are more likely to develop this condition if you: Have a weak body defense system (immune system). Do a lot of swimming or diving. Overuse nasal sprays. Smoke. What are the signs or symptoms? The main symptoms of this condition are pain and a feeling of pressure around the affected sinuses. Other symptoms include: Stuffy nose  or congestion that makes it difficult to breathe through your nose. Thick yellow or greenish drainage from your nose. Tenderness, swelling, and warmth over the affected sinuses. A cough that may get worse at night. Decreased sense of smell and taste. Extra mucus that collects in the throat or the back of the nose (postnasal drip)  causing a sore throat or bad breath. Tiredness (fatigue). Fever. How is this diagnosed? This condition is diagnosed based on: Your symptoms. Your medical history. A physical exam. Tests to find out if your condition is acute or chronic. This may include: Checking your nose for nasal polyps. Viewing your sinuses using a device that has a light (endoscope). Testing for allergies or bacteria. Imaging tests, such as an MRI or CT scan. In rare cases, a bone biopsy may be done to rule out more serious types of fungal sinus disease. How is this treated? Treatment for a sinus infection depends on the cause and whether your condition is chronic or acute. If caused by a virus, your symptoms should go away on their own within 10 days. You may be given medicines to relieve symptoms. They include: Medicines that shrink swollen nasal passages (decongestants). A spray that eases inflammation of the nostrils (topical intranasal corticosteroids). Rinses that help get rid of thick mucus in your nose (nasal saline washes). Medicines that treat allergies (antihistamines). Over-the-counter pain relievers. If caused by bacteria, your health care provider may recommend waiting to see if your symptoms improve. Most bacterial infections will get better without antibiotic medicine. You may be given antibiotics if you have: A severe infection. A weak immune system. If caused by narrow nasal passages or nasal polyps, surgery may be needed. Follow these instructions at home: Medicines Take, use, or apply over-the-counter and prescription medicines only as told by your health care provider. These may include nasal sprays. If you were prescribed an antibiotic medicine, take it as told by your health care provider. Do not stop taking the antibiotic even if you start to feel better. Hydrate and humidify  Drink enough fluid to keep your urine pale yellow. Staying hydrated will help to thin your mucus. Use a cool mist  humidifier to keep the humidity level in your home above 50%. Inhale steam for 10-15 minutes, 3-4 times a day, or as told by your health care provider. You can do this in the bathroom while a hot shower is running. Limit your exposure to cool or dry air. Rest Rest as much as possible. Sleep with your head raised (elevated). Make sure you get enough sleep each night. General instructions  Apply a warm, moist washcloth to your face 3-4 times a day or as told by your health care provider. This will help with discomfort. Use nasal saline washes as often as told by your health care provider. Wash your hands often with soap and water to reduce your exposure to germs. If soap and water are not available, use hand sanitizer. Do not smoke. Avoid being around people who are smoking (secondhand smoke). Keep all follow-up visits. This is important. Contact a health care provider if: You have a fever. Your symptoms get worse. Your symptoms do not improve within 10 days. Get help right away if: You have a severe headache. You have persistent vomiting. You have severe pain or swelling around your face or eyes. You have vision problems. You develop confusion. Your neck is stiff. You have trouble breathing. These symptoms may be an emergency. Get help right away. Call 911. Do  not wait to see if the symptoms will go away. Do not drive yourself to the hospital. Summary A sinus infection is soreness and inflammation of your sinuses. Sinuses are hollow spaces in the bones around your face. This condition is caused by nasal tissues that become inflamed or swollen. The swelling traps or blocks the flow of mucus. This allows bacteria, viruses, and fungi to grow, which leads to infection. If you were prescribed an antibiotic medicine, take it as told by your health care provider. Do not stop taking the antibiotic even if you start to feel better. Keep all follow-up visits. This is important. This  information is not intended to replace advice given to you by your health care provider. Make sure you discuss any questions you have with your health care provider. Document Revised: 10/19/2021 Document Reviewed: 10/19/2021 Elsevier Patient Education  2024 Elsevier Inc.   If you have been instructed to have an in-person evaluation today at a local Urgent Care facility, please use the link below. It will take you to a list of all of our available [facility], including address, phone number and hours of operation. Please do not delay care.  [facility]  If you or a family member do not have a primary care provider, use the link below to schedule a visit and establish care. When you choose a [facility] primary care physician or advanced practice provider, you gain a long-term partner in health. Find a Primary Care Provider  Learn more about [facility]'s in-office and virtual care options: [facility] - Get Care Now

## 2024-03-14 ENCOUNTER — Ambulatory Visit

## 2024-03-14 VITALS — BP 119/74 | HR 68 | Ht 68.0 in | Wt 223.0 lb

## 2024-03-14 DIAGNOSIS — H6993 Unspecified Eustachian tube disorder, bilateral: Secondary | ICD-10-CM

## 2024-03-14 NOTE — Progress Notes (Signed)
   Acute Office Visit  Subjective:     Patient ID: Cynthia [name], female    DOB: 12/29/1955, 68 y.o.   MRN: [id number]  Chief Complaint  Patient presents with   Medical Management of Chronic Issues    Pt states "she had a sinus infection and states her ears have pressure and drainig and bothering her     HPI Patient is in today for Sinusitis: Patient presents with chronic sinusitis. The patient reports chronic sinus infections for 2 weeks.  Her symptoms include nasal congestion, headaches, facial pain.  There has not been a history of nasal congestion, headaches, facial pain. There has not been a history of chronic otitis media or pharyngotonsillitis.  Prior antibiotic therapy has included Augmentin . Other medications have included Nasonex.  She has not had allergy testing which was not done.  Review of Systems  Constitutional:  Negative for malaise/fatigue.        Objective:    BP 119/74   Pulse 68   Ht 5\' 8"  (1.727 m)   Wt 223 lb 0.8 oz (101.2 kg)   LMP 03/28/1996 (Within Months)   SpO2 95%   BMI 33.91 kg/m     Physical Exam Vitals and nursing note reviewed.  Constitutional:      Appearance: Normal appearance.  HENT:     Head: Normocephalic.     Right Ear: Hearing, tympanic membrane, ear canal and external ear normal.     Left Ear: Hearing, tympanic membrane, ear canal and external ear normal.     Nose: No congestion or rhinorrhea.     Right Turbinates: Enlarged.     Left Turbinates: Not enlarged.     Right Sinus: No maxillary sinus tenderness or frontal sinus tenderness.     Left Sinus: No maxillary sinus tenderness or frontal sinus tenderness.     Mouth/Throat:     Mouth: Mucous membranes are moist.     Pharynx: Oropharynx is clear. Uvula midline.     Tonsils: No tonsillar exudate.  Eyes:     Extraocular Movements: Extraocular movements intact.     Pupils: Pupils are equal, round, and reactive to light.  Cardiovascular:     Rate and Rhythm: Normal rate  and regular rhythm.  Pulmonary:     Effort: Pulmonary effort is normal.     Breath sounds: Normal breath sounds.  Neurological:     Mental Status: She is alert and oriented to person, place, and time.  Psychiatric:        Mood and Affect: Mood normal.        Thought Content: Thought content normal.     No results found for any visits on 03/14/24.      Assessment & Plan:   Problem List Items Addressed This Visit       Nervous and Auditory   Dysfunction of both eustachian tubes - Primary   Just completed 10 days of Augmenting yesterday. Recommend resuming use of Flonase . May take this in conjunction with Astelin . also recommend adding Zyrtec  or Claritin daily.        No orders of the defined types were placed in this encounter.   Return if symptoms worsen or fail to improve.  Alison Irvine, FNP

## 2024-03-14 NOTE — Patient Instructions (Signed)
 Recommend adding OTC Zyrtec or Claritin for continued symptoms.   Also recommend restarting Flonase.    Schedule Medicare Wellness Exam.

## 2024-03-15 DIAGNOSIS — H6993 Unspecified Eustachian tube disorder, bilateral: Secondary | ICD-10-CM | POA: Insufficient documentation

## 2024-03-15 NOTE — Assessment & Plan Note (Signed)
 Just completed 10 days of Augmenting yesterday. Recommend resuming use of Flonase . May take this in conjunction with Astelin . also recommend adding Zyrtec  or Claritin daily.

## 2024-03-20 ENCOUNTER — Other Ambulatory Visit: Payer: Self-pay | Admitting: Family Medicine

## 2024-03-28 ENCOUNTER — Other Ambulatory Visit: Payer: Self-pay

## 2024-03-28 DIAGNOSIS — M159 Polyosteoarthritis, unspecified: Secondary | ICD-10-CM

## 2024-03-28 MED ORDER — CELECOXIB 200 MG PO CAPS: 200.0000 mg | ORAL_CAPSULE | Freq: Every day | ORAL | 1 refills | Status: DC

## 2024-05-02 ENCOUNTER — Encounter: Payer: Self-pay | Admitting: Cardiovascular Disease

## 2024-05-02 NOTE — Telephone Encounter (Signed)
 Apparently someone in the office thinks I am not taking new patients. Can we clarify this with our schedulres, please?

## 2024-05-02 NOTE — Telephone Encounter (Signed)
 Cynthia [name], can we please add her on to see me on July 14 at 10 AM?

## 2024-05-03 ENCOUNTER — Encounter (INDEPENDENT_AMBULATORY_CARE_PROVIDER_SITE_OTHER): Payer: Self-pay | Admitting: Otolaryngology

## 2024-05-03 ENCOUNTER — Ambulatory Visit (INDEPENDENT_AMBULATORY_CARE_PROVIDER_SITE_OTHER): Admitting: Otolaryngology

## 2024-05-03 VITALS — BP 111/74 | HR 68 | Ht 67.0 in | Wt 220.0 lb

## 2024-05-03 DIAGNOSIS — H6990 Unspecified Eustachian tube disorder, unspecified ear: Secondary | ICD-10-CM

## 2024-05-03 DIAGNOSIS — J310 Chronic rhinitis: Secondary | ICD-10-CM | POA: Diagnosis not present

## 2024-05-03 DIAGNOSIS — J342 Deviated nasal septum: Secondary | ICD-10-CM | POA: Diagnosis not present

## 2024-05-03 DIAGNOSIS — H608X9 Other otitis externa, unspecified ear: Secondary | ICD-10-CM | POA: Diagnosis not present

## 2024-05-03 DIAGNOSIS — J343 Hypertrophy of nasal turbinates: Secondary | ICD-10-CM

## 2024-05-03 DIAGNOSIS — H6993 Unspecified Eustachian tube disorder, bilateral: Secondary | ICD-10-CM

## 2024-05-03 DIAGNOSIS — R0981 Nasal congestion: Secondary | ICD-10-CM | POA: Diagnosis not present

## 2024-05-03 DIAGNOSIS — H608X3 Other otitis externa, bilateral: Secondary | ICD-10-CM

## 2024-05-03 MED ORDER — MOMETASONE FUROATE 0.1 % EX CREA: TOPICAL_CREAM | CUTANEOUS | 2 refills | Status: AC

## 2024-05-06 DIAGNOSIS — J310 Chronic rhinitis: Secondary | ICD-10-CM | POA: Insufficient documentation

## 2024-05-06 DIAGNOSIS — J343 Hypertrophy of nasal turbinates: Secondary | ICD-10-CM | POA: Insufficient documentation

## 2024-05-06 DIAGNOSIS — J342 Deviated nasal septum: Secondary | ICD-10-CM | POA: Insufficient documentation

## 2024-05-06 DIAGNOSIS — H608X3 Other otitis externa, bilateral: Secondary | ICD-10-CM | POA: Insufficient documentation

## 2024-05-06 NOTE — Telephone Encounter (Signed)
 Informed patient ,  for her sister an appointment with Dr Alvis Ba 06/10/24 at 10 am.  Any question can send another Mychart message.   The Aug 2025 appointment has been schedule to the7/14/25.

## 2024-05-06 NOTE — Progress Notes (Signed)
 Patient ID: Cynthia [name], female   DOB: 09-Oct-1956, 68 y.o.   MRN: [id number]  CC: Itchy and fluid sensation in ears, nasal congestion  HPI: The patient is a 68 year old female who returns today for follow-up evaluation.  The patient has a history of eustachian tube dysfunction, chronic rhinitis, nasal septal deviation, and bilateral inferior turbinate hypertrophy.  She was treated with Flonase  nasal spray and Valsalva exercise.  According to the patient, she was doing well until March 2025, when she started experiencing fluid and itchy sensation in her ears.  She has restarted the Flonase  nasal spray.  She subsequently developed an episode of sinusitis, sore throat, and chronic cough.  She was treated with Augmentin  and prednisone .  Despite the treatment, she continues to have difficulty performing the Valsalva exercise.  Currently she denies any facial pain, fever, otalgia, otorrhea.  Exam: General: Communicates without difficulty, well nourished, no acute distress. Head: Normocephalic, no evidence injury, no tenderness, facial buttresses intact without stepoff. Face/sinus: No tenderness to palpation and percussion. Facial movement is normal and symmetric. Eyes: PERRL, EOMI. No scleral icterus, conjunctivae clear. Neuro: CN II exam reveals vision grossly intact.  No nystagmus at any point of gaze. Ears: Auricles well formed without lesions.  Ear canals are intact without mass or lesion.  No erythema or edema is appreciated.  The TMs are intact without fluid.  Eczematous changes are noted within the ear canals.  Nose: External evaluation reveals normal support and skin without lesions.  Dorsum is intact.  Anterior rhinoscopy reveals congested mucosa over anterior aspect of inferior turbinates and intact septum.  No purulence noted. Oral:  Oral cavity and oropharynx are intact, symmetric, without erythema or edema.  Mucosa is moist without lesions. Neck: Full range of motion without pain.  There is no  significant lymphadenopathy.  No masses palpable.  Thyroid  bed within normal limits to palpation.  Parotid glands and submandibular glands equal bilaterally without mass.  Trachea is midline. Neuro:  CN 2-12 grossly intact.   Assessment: 1.  Chronic rhinitis with nasal mucosal congestion, nasal septal deviation, and bilateral inferior turbinate hypertrophy. 2.  Chronic eczematous otitis externa. 3.  Chronic eustachian tube dysfunction.  Plan: 1.  The physical exam findings are reviewed with the patient. 2.  The patient is reassured that no acute infection is noted today. 3.  The pathophysiology of eustachian tube dysfunction is extensively discussed with the patient. 4.  Continue Flonase  nasal spray 2 sprays each nostril daily.  The importance of consistent daily use is discussed. 4.  Elocon  cream to treat the chronic eczematous otitis externa.   5.  The patient is encouraged to call with any questions or concerns.

## 2024-05-07 DIAGNOSIS — T819XXA Unspecified complication of procedure, initial encounter: Secondary | ICD-10-CM | POA: Insufficient documentation

## 2024-05-07 DIAGNOSIS — M961 Postlaminectomy syndrome, not elsewhere classified: Secondary | ICD-10-CM | POA: Diagnosis not present

## 2024-05-31 ENCOUNTER — Other Ambulatory Visit: Payer: Self-pay | Admitting: Medical Genetics

## 2024-06-03 ENCOUNTER — Other Ambulatory Visit (HOSPITAL_COMMUNITY)
Admission: RE | Admit: 2024-06-03 | Discharge: 2024-06-03 | Disposition: A | Discharge status: Home / Self Care | Payer: Self-pay | Source: Ambulatory Visit | Attending: Medical Genetics | Admitting: Medical Genetics

## 2024-06-07 ENCOUNTER — Ambulatory Visit

## 2024-06-07 VITALS — BP 109/71 | HR 59 | Ht 68.0 in | Wt 226.1 lb

## 2024-06-07 DIAGNOSIS — E039 Hypothyroidism, unspecified: Secondary | ICD-10-CM | POA: Diagnosis not present

## 2024-06-07 DIAGNOSIS — R7309 Other abnormal glucose: Secondary | ICD-10-CM | POA: Diagnosis not present

## 2024-06-07 DIAGNOSIS — E559 Vitamin D deficiency, unspecified: Secondary | ICD-10-CM

## 2024-06-07 NOTE — Progress Notes (Unsigned)
 Established Patient Office Visit  Subjective   Patient ID: Cynthia [name], female    DOB: Apr 04, 1956  Age: 68 y.o. MRN: [id number]  Chief Complaint  Patient presents with   Medical Management of Chronic Issues    6 month follow up    HPI  Patient Active Problem List   Diagnosis Date Noted   Complication of surgical procedure 05/07/2024   Chronic eczematous otitis externa of both ears 05/06/2024   Chronic rhinitis 05/06/2024   Deviated nasal septum 05/06/2024   Hypertrophy of nasal turbinates 05/06/2024   Dysfunction of both eustachian tubes 03/15/2024   Trochanteric bursitis of left hip 09/24/2023   Pain of left hip joint 09/20/2023   AV junctional bradycardia 12/12/2019   Hypothyroidism    Lumbar post-laminectomy syndrome 11/01/2018   Degeneration of lumbar intervertebral disc 11/01/2018   MVP (mitral valve prolapse) 03/14/2017   Family history of aortic aneurysm 03/14/2017   Osteoarthritis 05/25/2016   Obesity (BMI 30-39.9) 05/25/2016   Vitamin D  deficiency 05/12/2015   History of colon polyps 05/27/2014   Vasomotor flushing 03/17/2014   Personal history of cardiac arrhythmia 03/17/2014      ROS    Objective:     BP 109/71   Pulse (!) 59   Ht 5' 8 (1.727 m)   Wt 226 lb 1.9 oz (102.6 kg)   LMP 03/28/1996 (Within Months)   SpO2 97%   BMI 34.38 kg/m  BP Readings from Last 3 Encounters:  06/07/24 109/71  05/03/24 111/74  03/14/24 119/74   Wt Readings from Last 3 Encounters:  06/07/24 226 lb 1.9 oz (102.6 kg)  05/03/24 220 lb (99.8 kg)  03/14/24 223 lb 0.8 oz (101.2 kg)      Physical Exam Vitals and nursing note reviewed.  Constitutional:      Appearance: Normal appearance.  HENT:     Head: Normocephalic.  Eyes:     Extraocular Movements: Extraocular movements intact.     Pupils: Pupils are equal, round, and reactive to light.  Cardiovascular:     Rate and Rhythm: Normal rate and regular rhythm.  Pulmonary:     Effort: Pulmonary effort  is normal.     Breath sounds: Normal breath sounds.  Musculoskeletal:     Cervical back: Normal range of motion and neck supple.  Neurological:     Mental Status: She is alert and oriented to person, place, and time.  Psychiatric:        Mood and Affect: Mood normal.        Thought Content: Thought content normal.     No results found for any visits on 06/07/24.  Last CBC Lab Results  Component Value Date   WBC 6.0 02/08/2024   HGB 13.4 02/08/2024   HCT 40.6 02/08/2024   MCV 92 02/08/2024   MCH 30.3 02/08/2024   RDW 12.6 02/08/2024   PLT 254 02/08/2024   Last metabolic panel Lab Results  Component Value Date   GLUCOSE 108 (H) 02/08/2024   NA 142 02/08/2024   K 5.0 02/08/2024   CL 106 02/08/2024   CO2 19 (L) 02/08/2024   BUN 19 02/08/2024   CREATININE 0.79 02/08/2024   EGFR 82 02/08/2024   CALCIUM 9.4 02/08/2024   PROT 6.6 02/08/2024   ALBUMIN 4.7 02/08/2024   LABGLOB 1.9 02/08/2024   AGRATIO 2.0 04/11/2023   BILITOT 0.4 02/08/2024   ALKPHOS 107 02/08/2024   AST 18 02/08/2024   ALT 19 02/08/2024   ANIONGAP 7 10/16/2017  Last lipids Lab Results  Component Value Date   CHOL 184 02/08/2024   HDL 73 02/08/2024   LDLCALC 88 02/08/2024   LDLDIRECT 113 12/17/2012   TRIG 131 02/08/2024   CHOLHDL 2.5 02/08/2024   Last hemoglobin A1c Lab Results  Component Value Date   HGBA1C 5.8 (H) 02/08/2024   Last vitamin D  Lab Results  Component Value Date   VD25OH 59.3 02/08/2024      The 10-year ASCVD risk score (Arnett DK, et al., 2019) is: 4.5%    Assessment & Plan:   Problem List Items Addressed This Visit       Endocrine   Hypothyroidism - Primary (Chronic)   She is prescribed levothyroxine  25 mcg daily.  Asymptomatic currently.  Repeat thyroid  studies ordered today.        Other   RESOLVED: Elevated hemoglobin A1c    No follow-ups on file.    Leita Longs, FNP

## 2024-06-10 ENCOUNTER — Other Ambulatory Visit: Payer: Self-pay

## 2024-06-10 DIAGNOSIS — N951 Menopausal and female climacteric states: Secondary | ICD-10-CM

## 2024-06-10 MED ORDER — PROGESTERONE 200 MG PO CAPS: 200.0000 mg | ORAL_CAPSULE | Freq: Every day | ORAL | 5 refills | Status: DC

## 2024-06-10 MED ORDER — ESTRADIOL 0.0375 MG/24HR TD PTWK
0.0375 mg | MEDICATED_PATCH | TRANSDERMAL | 11 refills | Status: AC
Start: 2024-06-10 — End: ?

## 2024-06-10 NOTE — Assessment & Plan Note (Signed)
 She is prescribed levothyroxine  25 mcg daily.  Asymptomatic currently.  Repeat thyroid  studies ordered today.

## 2024-06-14 LAB — GENECONNECT MOLECULAR SCREEN: Genetic Analysis Overall Interpretation: NEGATIVE

## 2024-06-21 ENCOUNTER — Telehealth: Payer: Self-pay

## 2024-06-21 NOTE — Telephone Encounter (Signed)
 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

## 2024-06-25 ENCOUNTER — Other Ambulatory Visit: Payer: Self-pay

## 2024-06-25 DIAGNOSIS — Z1159 Encounter for screening for other viral diseases: Secondary | ICD-10-CM | POA: Diagnosis not present

## 2024-06-25 NOTE — Telephone Encounter (Signed)
 Noted, I will let miriam know

## 2024-06-25 NOTE — Telephone Encounter (Signed)
 Patient came by office having blood work done today will come by next week to pick up her form when back in town

## 2024-06-28 LAB — QUANTIFERON-TB GOLD PLUS
QuantiFERON Mitogen Value: >10 [IU]/mL
QuantiFERON Nil Value: 0.05 [IU]/mL
QuantiFERON TB1 Ag Value: 0.06 [IU]/mL
QuantiFERON TB2 Ag Value: 0.06 [IU]/mL

## 2024-06-28 LAB — MEASLES/MUMPS/RUBELLA IMMUNITY
MUMPS ABS, IGG: 83.6 [AU]/ml (ref >10.9)
RUBEOLA AB, IGG: >300 [AU]/ml (ref >16.4)
Rubella Antibodies, IGG: 1.73 {index} (ref >0.99)

## 2024-06-28 LAB — HEPATITIS B SURFACE ANTIBODY, QUANTITATIVE: Hepatitis B Surf Ab Quant: <3.5 m[IU]/mL — ABNORMAL LOW

## 2024-07-03 ENCOUNTER — Ambulatory Visit: Payer: Self-pay

## 2024-07-04 ENCOUNTER — Ambulatory Visit: Payer: Medicare PPO | Admitting: Internal Medicine

## 2024-07-04 ENCOUNTER — Ambulatory Visit

## 2024-07-04 NOTE — Telephone Encounter (Signed)
 Patient need to wait for laura to review her labs with her

## 2024-07-10 ENCOUNTER — Encounter

## 2024-07-11 ENCOUNTER — Other Ambulatory Visit: Payer: Self-pay

## 2024-07-11 DIAGNOSIS — Z23 Encounter for immunization: Secondary | ICD-10-CM

## 2024-07-12 ENCOUNTER — Telehealth: Payer: Self-pay

## 2024-07-12 NOTE — Telephone Encounter (Signed)
 Called patient left voicemail added to schedule

## 2024-07-12 NOTE — Telephone Encounter (Signed)
 Sent message to front to schedule her for nurse visit on Monday for Hep B, Papers are at my desk

## 2024-07-12 NOTE — Telephone Encounter (Signed)
 Patient came by office needs Hep B shot, and paperwork, needs for substitute teacher at school. Can nurse reach out ot patient when Hep B shots come in.

## 2024-07-12 NOTE — Telephone Encounter (Signed)
 Paper work in [employer]olic[employer] at [employer] waiting for Hep B vac

## 2024-07-15 ENCOUNTER — Ambulatory Visit

## 2024-07-15 ENCOUNTER — Other Ambulatory Visit (INDEPENDENT_AMBULATORY_CARE_PROVIDER_SITE_OTHER)

## 2024-07-15 DIAGNOSIS — Z23 Encounter for immunization: Secondary | ICD-10-CM

## 2024-07-15 NOTE — Progress Notes (Signed)
 Patient is in office today for a nurse visit for Hep-b Immunization. Patient Injection was given in the  Left deltoid. Patient tolerated injection well.

## 2024-08-12 ENCOUNTER — Ambulatory Visit

## 2024-08-16 ENCOUNTER — Ambulatory Visit (INDEPENDENT_AMBULATORY_CARE_PROVIDER_SITE_OTHER)

## 2024-08-16 DIAGNOSIS — Z23 Encounter for immunization: Secondary | ICD-10-CM

## 2024-08-16 NOTE — Progress Notes (Signed)
 Patient is in office today for a nurse visit for Immunization. Patient Injection was given in the  Left deltoid. Patient tolerated injection well.

## 2024-08-21 DIAGNOSIS — M19011 Primary osteoarthritis, right shoulder: Secondary | ICD-10-CM | POA: Diagnosis not present

## 2024-09-04 ENCOUNTER — Ambulatory Visit
Admission: RE | Admit: 2024-09-04 | Discharge: 2024-09-04 | Disposition: A | Discharge status: Home / Self Care | Source: Ambulatory Visit | Attending: Family Medicine | Admitting: Family Medicine

## 2024-09-04 VITALS — BP 122/75 | HR 67 | Temp 98.3°F | Resp 18

## 2024-09-04 DIAGNOSIS — R051 Acute cough: Secondary | ICD-10-CM

## 2024-09-04 DIAGNOSIS — H9201 Otalgia, right ear: Secondary | ICD-10-CM | POA: Diagnosis not present

## 2024-09-04 DIAGNOSIS — R062 Wheezing: Secondary | ICD-10-CM

## 2024-09-04 LAB — POC SOFIA SARS ANTIGEN FIA: SARS Coronavirus 2 Ag: NEGATIVE

## 2024-09-04 MED ORDER — HYDROCODONE BIT-HOMATROP MBR 5-1.5 MG/5ML PO SOLN: 5.0000 mL | Freq: Four times a day (QID) | ORAL | 0 refills | Status: DC | PRN

## 2024-09-04 MED ORDER — PREDNISONE 20 MG PO TABS: 40.0000 mg | ORAL_TABLET | Freq: Every day | ORAL | 0 refills | Status: AC

## 2024-09-04 NOTE — Discharge Instructions (Signed)
 Be aware, your cough medication may cause drowsiness. Please do not drive, operate heavy machinery or make important decisions while on this medication, it can cloud your judgement.

## 2024-09-04 NOTE — ED Triage Notes (Signed)
 Nasal drainage, cough and right ear pressure x 2 days.  Has taken zyrtec  1 time.  Wants to be covid tested.

## 2024-09-04 NOTE — ED Provider Notes (Signed)
 [name]   [id number] 09/04/24 Arrival Time: 1059  ASSESSMENT & PLAN:  1. Acute otalgia, right   2. Acute cough   3. Wheezing    Without resp distress. OTC symptom care as needed. Begin: Meds ordered this encounter  Medications   predniSONE  (DELTASONE ) 20 MG tablet    Sig: Take 2 tablets (40 mg total) by mouth daily.    Dispense:  10 tablet    Refill:  0   HYDROcodone  bit-homatropine (HYCODAN) 5-1.5 MG/5ML syrup    Sig: Take 5 mLs by mouth every 6 (six) hours as needed for cough.    Dispense:  60 mL    Refill:  0     Follow-up Information     Bevely Doffing, FNP.   Specialty: Family Medicine Why: As needed. Contact information: [address] [place] [address] [phone]                 Reviewed expectations re: course of current medical issues. Questions answered. Outlined signs and symptoms indicating need for more acute intervention. Understanding verbalized. After Visit Summary given.   SUBJECTIVE: History from: Patient. Cynthia [name] is a 68 y.o. female. Nasal drainage, cough and right ear pressure x 2 days.  Has taken zyrtec  1 time.  Wants to be covid tested.   Denies: fever. Normal PO intake without n/v/d. Cough is affecting sleep.  OBJECTIVE:  Vitals:   09/04/24 1104  BP: 122/75  Pulse: 67  Resp: 18  Temp: 98.3 F (36.8 C)  TempSrc: Oral  SpO2: 98%    General appearance: alert; no distress Eyes: PERRLA; EOMI; conjunctiva normal HENT: [place]; AT; with nasal congestion; R serous otitis Neck: supple  Lungs: speaks full sentences without difficulty; unlabored; mild wheezing bilat Extremities: no edema Skin: warm and dry Neurologic: normal gait Psychological: alert and cooperative; normal mood and affect  Labs:  Labs Reviewed  POC SOFIA SARS ANTIGEN FIA    Imaging: No results found.  No Known Allergies  Past Medical History:  Diagnosis Date   Arthritis    Cancer (HCC)    skin on nose    Cervical disc disease    Cystocele    Heart murmur    History of adenomatous polyp of colon 06/24/2015   History of left hip replacement 05/14/2021   History of rheumatic fever    childhood   Hypothyroidism    Liver tumor (benign)    Mitral valve prolapse    PONV (postoperative nausea and vomiting)    Primary osteoarthritis of right hip    Intraarticular steroid injection by Dr. Bonner 01/2016   Rectocele    SA node dysfunction (HCC)    Social History   Socioeconomic History   Marital status: Married    Spouse name: [name]   Number of children: 3   Years of education: Not on file   Highest education level: Master's degree (e.g., MA, MS, MEng, MEd, MSW, MBA)  Occupational History   Occupation: TEACHER    Employer: [employer]  Tobacco Use   Smoking status: Never    Passive exposure: Never   Smokeless tobacco: Never  Vaping Use   Vaping status: Never Used  Substance and Sexual Activity   Alcohol use: Yes    Alcohol/week: 6.0 standard drinks of alcohol    Types: 3 Cans of beer, 3 Standard drinks or equivalent per week   Drug use: No   Sexual activity: Yes    Birth control/protection: Surgical,  Other-see comments    Comment: Hyst  Other Topics Concern   Not on file  Social History Narrative   Cynthia [name] lives in [place] with her husband.  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 She has 3 grown sons. She is from [place] .   Social Drivers of Corporate investment banker Strain: Low Risk  (06/03/2024)   Overall Financial Resource Strain (CARDIA)    Difficulty of Paying Living Expenses: Not hard at all  Food Insecurity: No Food Insecurity (06/03/2024)   Hunger Vital Sign    Worried About Running Out of Food in the Last Year: Never true    Ran Out of Food in the Last Year: Never true  Transportation Needs: No Transportation Needs (06/03/2024)   PRAPARE - Administrator, Civil Service (Medical): No     Lack of Transportation (Non-Medical): No  Physical Activity: Insufficiently Active (06/03/2024)   Exercise Vital Sign    Days of Exercise per Week: 2 days    Minutes of Exercise per Session: 40 min  Stress: No Stress Concern Present (06/03/2024)   [employer] of Occupational Health - Occupational Stress Questionnaire    Feeling of Stress: Not at all  Social Connections: Socially Integrated (06/03/2024)   Social Connection and Isolation Panel    Frequency of Communication with Friends and Family: More than three times a week    Frequency of Social Gatherings with Friends and Family: Twice a week    Attends Religious Services: More than 4 times per year    Active Member of [employer] or Organizations: Yes    Attends Banker Meetings: More than 4 times per year    Marital Status: Married  Catering manager Violence: Unknown (03/03/2022)   Received from [facility]   HITS    Physically Hurt: Not on file    Insult or Talk Down To: Not on file    Threaten Physical Harm: Not on file    Scream or Curse: Not on file   Family History  Problem Relation Age of Onset   Alcohol abuse Mother    CVA Mother        Dec 65   Heart disease Mother    Stroke Mother    Cancer Father        prostate   Arthritis Father    Alcohol abuse Sister    Heart disease Brother        congenital heart defect--Dec age 62 from MI   Early death Brother    Heart murmur Son    Heart attack Son    Heart murmur Son    Heart murmur Son    Cancer Maternal Aunt 64       Dec colon CA age 23   Cancer Paternal Grandfather    Cancer Cousin 66       Dec colon CA age 20   Early death Maternal Uncle    Vision loss Maternal Uncle    Varicose Veins Maternal Aunt    Past Surgical History:  Procedure Laterality Date   ABDOMINAL HYSTERECTOMY  05/1996   TVH--ovaries remain   ABDOMINAL SACROCOLPOPEXY N/A 06/13/2017   Procedure: ABDOMINO SACROCOLPOPEXY with [place] culdoplasty;  Surgeon: Cathlyn JAYSON Nikki Bobie FORBES,  MD;  Location: [facility];  Service: Gynecology;  Laterality: N/A;   ANTERIOR AND POSTERIOR REPAIR N/A 06/13/2017   Procedure: ANTERIOR (CYSTOCELE) AND POSTERIOR REPAIR (RECTOCELE);  Surgeon: Cathlyn JAYSON Nikki,  Bobie BRAVO, MD;  Location: [facility];  Service: Gynecology;  Laterality: N/A;   arthroscopic knee Right    BACK SURGERY  2012   disc rupture   BILATERAL SALPINGECTOMY Bilateral 06/13/2017   Procedure: BILATERAL SALPINGECTOMY;  Surgeon: Cathlyn JAYSON Nikki Bobie BRAVO, MD;  Location: [facility];  Service: Gynecology;  Laterality: Bilateral;   BLADDER SUSPENSION N/A 06/13/2017   Procedure: TRANSVAGINAL TAPE (TVT) PROCEDURE;  Surgeon: Cathlyn JAYSON Nikki Bobie BRAVO, MD;  Location: [facility];  Service: Gynecology;  Laterality: N/A;   COLONOSCOPY W/ POLYPECTOMY  06/24/2015   Recall 5 yrs (Dr. [facility])   CYSTOSCOPY N/A 06/13/2017   Procedure: PHYLLIS;  Surgeon: Cathlyn JAYSON Nikki Bobie BRAVO, MD;  Location: [facility];  Service: Gynecology;  Laterality: N/A;   JOINT REPLACEMENT  June 13, 2022, June 18, 2021, June 2020   R Hip, L Hip, R Knee   KNEE RECONSTRUCTION Right 2008   LIVER BIOPSY  1997   fatty tumors   REPLACEMENT TOTAL KNEE Right 04/2019   [facility] SURGERY  2010     Rolinda Rogue, MD 09/04/24 1147

## 2024-09-10 DIAGNOSIS — M25511 Pain in right shoulder: Secondary | ICD-10-CM | POA: Diagnosis not present

## 2024-09-12 DIAGNOSIS — M25511 Pain in right shoulder: Secondary | ICD-10-CM | POA: Diagnosis not present

## 2024-09-14 ENCOUNTER — Other Ambulatory Visit: Payer: Self-pay

## 2024-09-14 DIAGNOSIS — M159 Polyosteoarthritis, unspecified: Secondary | ICD-10-CM

## 2024-09-17 DIAGNOSIS — M25511 Pain in right shoulder: Secondary | ICD-10-CM | POA: Diagnosis not present

## 2024-09-19 DIAGNOSIS — M25511 Pain in right shoulder: Secondary | ICD-10-CM | POA: Diagnosis not present

## 2024-10-01 ENCOUNTER — Ambulatory Visit

## 2024-10-14 ENCOUNTER — Other Ambulatory Visit: Payer: Self-pay

## 2024-10-14 MED ORDER — LEVOTHYROXINE SODIUM 25 MCG PO TABS: 25.0000 ug | ORAL_TABLET | Freq: Every day | ORAL | 1 refills | Status: AC

## 2024-10-15 ENCOUNTER — Ambulatory Visit

## 2024-10-28 DIAGNOSIS — M19011 Primary osteoarthritis, right shoulder: Secondary | ICD-10-CM | POA: Diagnosis not present

## 2024-11-18 ENCOUNTER — Ambulatory Visit

## 2024-11-18 VITALS — Ht 68.0 in | Wt 196.0 lb

## 2024-11-18 DIAGNOSIS — Z0000 Encounter for general adult medical examination without abnormal findings: Secondary | ICD-10-CM

## 2024-11-18 DIAGNOSIS — Z5320 Procedure and treatment not carried out because of patient's decision for unspecified reasons: Secondary | ICD-10-CM

## 2024-11-18 DIAGNOSIS — Z780 Asymptomatic menopausal state: Secondary | ICD-10-CM | POA: Diagnosis not present

## 2024-11-18 NOTE — Progress Notes (Signed)
 "  HM Addressed: DEXA ordered Screenings not ordered: Declined Referral/Order for Mammogram deferred today. Patient stated she has made a personal decision to only get a mammogram every two years instead of 1  Chief Complaint  Patient presents with   Medicare Wellness     Subjective:   Cynthia [name] is a 68 y.o. female who presents for a Medicare Annual Wellness Visit.  Visit info / Clinical Intake: Medicare Wellness Visit Type:: Initial Annual Wellness Visit Persons participating in visit and providing information:: patient Medicare Wellness Visit Mode:: Telephone If telephone:: video declined Since this visit was completed virtually, some vitals may be partially provided or unavailable. Missing vitals are due to the limitations of the virtual format.: Documented vitals are patient reported If Telephone or Video please confirm:: I connected with patient using audio/video enable telemedicine. I verified patient identity with two identifiers, discussed telehealth limitations, and patient agreed to proceed. Patient Location:: car Provider Location:: office Interpreter Needed?: No Pre-visit prep was completed: yes AWV questionnaire completed by patient prior to visit?: no Living arrangements:: lives with spouse/significant other Patient's Overall Health Status Rating: very good Typical amount of pain: none Does pain affect daily life?: no Are you currently prescribed opioids?: no  Dietary Habits and Nutritional Risks How many meals a day?: 2 Eats fruit and vegetables daily?: yes Most meals are obtained by: preparing own meals In the last 2 weeks, have you had any of the following?: none Diabetic:: no  Functional Status Activities of Daily Living (to include ambulation/medication): Independent Ambulation: Independent Medication Administration: Independent Home Management (perform basic housework or laundry): Independent Manage your own finances?: yes Primary transportation  is: driving Concerns about vision?: no *vision screening is required for WTM* Concerns about hearing?: no  Fall Screening Falls in the past year?: 0 Number of falls in past year: 0 Was there an injury with Fall?: 0 Fall Risk Category Calculator: 0 Patient Fall Risk Level: Low Fall Risk  Fall Risk Patient at Risk for Falls Due to: History of fall(s) Fall risk Follow up: Falls evaluation completed; Education provided; Falls prevention discussed  Home and Transportation Safety: All rugs have non-skid backing?: yes All stairs or steps have railings?: N/A, no stairs Grab bars in the bathtub or shower?: yes Have non-skid surface in bathtub or shower?: yes Good home lighting?: yes Regular seat belt use?: yes Hospital stays in the last year:: no  Cognitive Assessment Difficulty concentrating, remembering, or making decisions? : no Will 6CIT or Mini Cog be Completed: yes What year is it?: 0 points What month is it?: 0 points Give patient an address phrase to remember (5 components): [address] [place] About what time is it?: 0 points Count backwards from 20 to 1: 0 points Say the months of the year in reverse: 0 points Repeat the address phrase from earlier: 0 points 6 CIT Score: 0 points  Advance Directives (For Healthcare) Does Patient Have a Medical Advance Directive?: No Would patient like information on creating a medical advance directive?: Yes (MAU/Ambulatory/Procedural Areas - Information given)  Reviewed/Updated  Reviewed/Updated: Reviewed All (Medical, Surgical, Family, Medications, Allergies, Care Teams, Patient Goals)    Allergies (verified) Patient has no known allergies.   Current Medications (verified) Outpatient Encounter Medications as of 11/18/2024  Medication Sig   celecoxib  (CELEBREX ) 200 MG capsule TAKE 1 CAPSULE BY MOUTH EVERY DAY   estradiol  (CLIMARA  - DOSED IN MG/24 HR) 0.0375 mg/24hr patch Place 1 patch (0.0375 mg total) onto the skin once a  week.   levothyroxine  (SYNTHROID ) 25 MCG tablet Take 1 tablet (25 mcg total) by mouth daily before breakfast.   mometasone  (ELOCON ) 0.1 % cream Apply topically daily as needed for itch   predniSONE  (DELTASONE ) 20 MG tablet Take 2 tablets (40 mg total) by mouth daily.   progesterone  (PROMETRIUM ) 200 MG capsule Take 1 capsule (200 mg total) by mouth daily.   [DISCONTINUED] HYDROcodone  bit-homatropine (HYCODAN) 5-1.5 MG/5ML syrup Take 5 mLs by mouth every 6 (six) hours as needed for cough.   No facility-administered encounter medications on file as of 11/18/2024.    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 Bonner 01/2016   Rectocele    SA node dysfunction [facility])    Past Surgical History:  Procedure Laterality Date   ABDOMINAL HYSTERECTOMY  05/1996   TVH--ovaries remain   ABDOMINAL SACROCOLPOPEXY N/A 06/13/2017   Procedure: ABDOMINO SACROCOLPOPEXY with [place] culdoplasty;  Surgeon: Cathlyn JAYSON Nikki Bobie FORBES, MD;  Location: [facility];  Service: Gynecology;  Laterality: N/A;   ANTERIOR AND POSTERIOR REPAIR N/A 06/13/2017   Procedure: ANTERIOR (CYSTOCELE) AND POSTERIOR REPAIR (RECTOCELE);  Surgeon: Cathlyn JAYSON Nikki Bobie FORBES, MD;  Location: [facility];  Service: Gynecology;  Laterality: N/A;   arthroscopic knee Right    BACK SURGERY  2012   disc rupture   BILATERAL SALPINGECTOMY Bilateral 06/13/2017   Procedure: BILATERAL SALPINGECTOMY;  Surgeon: Cathlyn JAYSON Nikki Bobie FORBES, MD;  Location: [facility];  Service: Gynecology;   Laterality: Bilateral;   BLADDER SUSPENSION N/A 06/13/2017   Procedure: TRANSVAGINAL TAPE (TVT) PROCEDURE;  Surgeon: Cathlyn JAYSON Nikki Bobie FORBES, MD;  Location: [facility];  Service: Gynecology;  Laterality: N/A;   COLONOSCOPY W/ POLYPECTOMY  06/24/2015   Recall 5 yrs ([facility])   CYSTOSCOPY N/A 06/13/2017   Procedure: PHYLLIS;  Surgeon: Cathlyn JAYSON Nikki Bobie FORBES, MD;  Location: [facility];  Service: Gynecology;  Laterality: N/A;   JOINT REPLACEMENT  June 13, 2022, June 18, 2021, June 2020   R Hip, L Hip, R Knee   KNEE RECONSTRUCTION Right 2008   LIVER BIOPSY  1997   fatty tumors   REPLACEMENT TOTAL KNEE Right 04/2019   SPINE SURGERY  2010   Family History  Problem Relation Age of Onset   Alcohol abuse Mother    CVA Mother        Dec 65   Heart disease Mother    Stroke Mother    Cancer Father        prostate   Arthritis Father    Alcohol abuse Sister    Heart disease Brother        congenital heart defect--Dec age 63 from MI   Early death Brother    Heart murmur Son    Heart attack Son    Heart disease Son    Heart murmur Son    Heart murmur Son    Cancer  Maternal Aunt 22       Dec colon CA age 56   Cancer Paternal Grandfather    Cancer Cousin 78       Dec colon CA age 35   Early death Maternal Uncle    Vision loss Maternal Uncle    Varicose Veins Maternal Aunt    COPD Sister    Social History   Occupational History   Occupation: TEACHER    Employer: [employer]  Tobacco Use   Smoking status: Never    Passive exposure: Never   Smokeless tobacco: Never  Vaping Use   Vaping status: Never Used  Substance and Sexual Activity   Alcohol use: Yes    Alcohol/week: 6.0 standard drinks of alcohol    Types: 3 Cans of beer, 3 Standard drinks or equivalent per week   Drug use: No   Sexual activity: Yes    Birth control/protection: Surgical, Other-see comments    Comment: Hyst   Tobacco Counseling Counseling given: Yes  SDOH  Screenings   Food Insecurity: No Food Insecurity (11/18/2024)  Housing: Low Risk (11/18/2024)  Transportation Needs: No Transportation Needs (11/18/2024)  Utilities: Not At Risk (11/18/2024)  Alcohol Screen: Low Risk (06/03/2024)  Depression (PHQ2-9): Low Risk (11/18/2024)  Financial Resource Strain: Low Risk (06/03/2024)  Physical Activity: Sufficiently Active (11/18/2024)  Social Connections: Socially Integrated (11/18/2024)  Stress: No Stress Concern Present (11/18/2024)  Tobacco Use: Low Risk (11/18/2024)  Health Literacy: Adequate Health Literacy (11/18/2024)   See flowsheets for full screening details  Depression Screen PHQ 2 & 9 Depression Scale- Over the past 2 weeks, how often have you been bothered by any of the following problems? Little interest or pleasure in doing things: 0 Feeling down, depressed, or hopeless (PHQ Adolescent also includes...irritable): 0 PHQ-2 Total Score: 0 Trouble falling or staying asleep, or sleeping too much: 0 Feeling tired or having little energy: 0 Poor appetite or overeating (PHQ Adolescent also includes...weight loss): 0 Feeling bad about yourself - or that you are a failure or have let yourself or your family down: 0 Trouble concentrating on things, such as reading the newspaper or watching television (PHQ Adolescent also includes...like school work): 0 Moving or speaking so slowly that other people could have noticed. Or the opposite - being so fidgety or restless that you have been moving around a lot more than usual: 0 Thoughts that you would be better off dead, or of hurting yourself in some way: 0 PHQ-9 Total Score: 0 If you checked off any problems, how difficult have these problems made it for you to do your work, take care of things at home, or get along with other people?: Not difficult at all  Depression Treatment Depression Interventions/Treatment : [id number] Score <4 Follow-up Not Indicated     Goals Addressed                This Visit's Progress     I want to lose the final 35 pounds of my weight loss goal. (pt-stated)               Objective:    Today's Vitals   11/18/24 0950  Weight: 196 lb (88.9 kg)  Height: 5' 8 (1.727 m)   Body mass index is 29.8 kg/m.  Hearing/Vision screen Hearing Screening - Comments:: Patient denies any hearing difficulties.   Vision Screening - Comments:: Wears rx glasses - up to date with routine eye exams with  Oneil Kawasaki  Immunizations and Health Maintenance Health  Maintenance  Topic Date Due   Medicare Annual Wellness (AWV)  Never done   Bone Density Scan  04/28/2022   Mammogram  05/11/2024   Colonoscopy  06/19/2025   DTaP/Tdap/Td (3 - Td or Tdap) 03/07/2028   Pneumococcal Vaccine: 50+ Years  Completed   Influenza Vaccine  Completed   Hepatitis C Screening  Completed   Zoster Vaccines- Shingrix  Completed   Meningococcal B Vaccine  Aged Out   Hepatitis B Vaccines 19-59 Average Risk  Discontinued   COVID-19 Vaccine  Discontinued        Assessment/Plan:  This is a routine wellness examination for [place].  Patient Care Team: Bevely Doffing, FNP as PCP - General (Family Medicine) Croitoru, Jerel, MD as Consulting Physician (Cardiology) Lucio Elsie BROCKS, MD as Referring Physician (Gastroenterology) Burnetta Aures, MD as Consulting Physician (Orthopedic Surgery) Darroll Anes, DO (Optometry) Ernie Cough, MD as Consulting Physician (Orthopedic Surgery) Melita Drivers, MD as Consulting Physician (Orthopedic Surgery)  I have personally reviewed and noted the following in the patients chart:   Medical and social history Use of alcohol, tobacco or illicit drugs  Current medications and supplements including opioid prescriptions. Functional ability and status Nutritional status Physical activity Advanced directives List of other physicians Hospitalizations, surgeries, and ER visits in previous 12 months Vitals Screenings to include cognitive,  depression, and falls Referrals and appointments  Orders Placed This Encounter  Procedures   DG Bone Density    Standing Status:   Future    Expiration Date:   11/18/2025    Reason for Exam (SYMPTOM  OR DIAGNOSIS REQUIRED):   post menopausal estrogen deficient    Preferred imaging location?:   [facility]   In addition, I have reviewed and discussed with patient certain preventive protocols, quality metrics, and best practice recommendations. A written personalized care plan for preventive services as well as general preventive health recommendations were provided to patient.   Tifanny Dollens, CMA   11/18/2024   Return Wednesday November 19, 2025 at 8:40am, for your yearly Medicare Wellness Visit in person.  After Visit Summary: (MyChart) Due to this being a telephonic visit, the after visit summary with patients personalized plan was offered to patient via MyChart    "

## 2024-11-18 NOTE — Patient Instructions (Signed)
 Cynthia [name],  Thank you for taking the time for your Medicare Wellness Visit. I appreciate your continued commitment to your health goals. Please review the care plan we discussed, and feel free to reach out if I can assist you further.  Please note that Annual Wellness Visits do not include a physical exam. Some assessments may be limited, especially if the visit was conducted virtually. If needed, we may recommend an in-person follow-up with your provider.  Ongoing Care Seeing your primary care provider every 3 to 6 months helps us  monitor your health and provide consistent, personalized care.   1 year follow up for Medicare well visit: Wednesday November 19, 2025 at 8:40 am with medicare wellness nurse in office  Referrals If a referral was made during today's visit and you haven't received any updates within two weeks, please contact the referred provider directly to check on the status.  Osteoporosis Screening An order was placed for you to have your Osteoporosis Screening. Call the number below to schedule that [facility]  [phone]   Recommended Screenings:  Health Maintenance  Topic Date Due   Medicare Annual Wellness Visit  Never done   Osteoporosis screening with Bone Density Scan  04/28/2022   Breast Cancer Screening  05/11/2024   Colon Cancer Screening  06/19/2025   DTaP/Tdap/Td vaccine (3 - Td or Tdap) 03/07/2028   Pneumococcal Vaccine for age over 58  Completed   Flu Shot  Completed   Hepatitis C Screening  Completed   Zoster (Shingles) Vaccine  Completed   Meningitis B Vaccine  Aged Out   Hepatitis B Vaccine  Discontinued   COVID-19 Vaccine  Discontinued       11/18/2024    9:55 AM  Advanced Directives  Does Patient Have a Medical Advance Directive? No  Would patient like information on creating a medical advance directive? Yes (MAU/Ambulatory/Procedural Areas - Information given)    Vision: Annual vision screenings are recommended for early detection  of glaucoma, cataracts, and diabetic retinopathy. These exams can also reveal signs of chronic conditions such as diabetes and high blood pressure.  Dental: Annual dental screenings help detect early signs of oral cancer, gum disease, and other conditions linked to overall health, including heart disease and diabetes.  Please see the attached documents for additional preventive care recommendations.

## 2024-11-23 ENCOUNTER — Other Ambulatory Visit: Payer: Self-pay

## 2024-11-23 DIAGNOSIS — N951 Menopausal and female climacteric states: Secondary | ICD-10-CM

## 2024-12-06 ENCOUNTER — Ambulatory Visit

## 2024-12-09 ENCOUNTER — Ambulatory Visit (HOSPITAL_COMMUNITY)
Admission: RE | Admit: 2024-12-09 | Discharge: 2024-12-09 | Disposition: A | Discharge status: Home / Self Care | Source: Ambulatory Visit

## 2024-12-09 DIAGNOSIS — Z780 Asymptomatic menopausal state: Secondary | ICD-10-CM | POA: Insufficient documentation

## 2025-01-16 ENCOUNTER — Ambulatory Visit

## 2025-02-05 ENCOUNTER — Ambulatory Visit

## 2025-11-19 ENCOUNTER — Ambulatory Visit: Payer: Self-pay
